# Patient Record
Sex: Female | Born: 1990
Health system: Southern US, Community
[De-identification: ages and names within clinical notes are randomized; demographics above are authoritative.]

## PROBLEM LIST (undated history)

## (undated) ENCOUNTER — Inpatient Hospital Stay: Payer: Self-pay

## (undated) DIAGNOSIS — I10 Essential (primary) hypertension: Secondary | ICD-10-CM

## (undated) DIAGNOSIS — O99210 Obesity complicating pregnancy, unspecified trimester: Secondary | ICD-10-CM

## (undated) DIAGNOSIS — F32A Depression, unspecified: Secondary | ICD-10-CM

## (undated) DIAGNOSIS — E119 Type 2 diabetes mellitus without complications: Secondary | ICD-10-CM

## (undated) DIAGNOSIS — F329 Major depressive disorder, single episode, unspecified: Secondary | ICD-10-CM

## (undated) DIAGNOSIS — F419 Anxiety disorder, unspecified: Secondary | ICD-10-CM

## (undated) DIAGNOSIS — O24419 Gestational diabetes mellitus in pregnancy, unspecified control: Secondary | ICD-10-CM

## (undated) HISTORY — PX: INDUCED ABORTION: SHX677

## (undated) HISTORY — PX: TONSILLECTOMY: SHX5217

## (undated) HISTORY — DX: Major depressive disorder, single episode, unspecified: F32.9

## (undated) HISTORY — DX: Depression, unspecified: F32.A

## (undated) HISTORY — DX: Obesity complicating pregnancy, unspecified trimester: O99.210

## (undated) HISTORY — DX: Anxiety disorder, unspecified: F41.9

---
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
## 2011-02-05 ENCOUNTER — Observation Stay: Payer: Self-pay | Admitting: Obstetrics and Gynecology

## 2011-02-08 ENCOUNTER — Observation Stay: Payer: Self-pay

## 2012-06-10 ENCOUNTER — Emergency Department: Payer: Self-pay | Admitting: Emergency Medicine

## 2012-10-28 ENCOUNTER — Emergency Department: Payer: Self-pay | Admitting: Emergency Medicine

## 2012-10-28 LAB — URINALYSIS, COMPLETE
Bilirubin,UR: NEGATIVE
Glucose,UR: NEGATIVE mg/dL (ref 0–75)
Nitrite: NEGATIVE
Ph: 5 (ref 4.5–8.0)
Protein: 30
Squamous Epithelial: 4
WBC UR: 8 /HPF (ref 0–5)

## 2012-10-28 LAB — COMPREHENSIVE METABOLIC PANEL
Albumin: 3.6 g/dL (ref 3.4–5.0)
BUN: 7 mg/dL (ref 7–18)
Bilirubin,Total: 0.4 mg/dL (ref 0.2–1.0)
Calcium, Total: 9 mg/dL (ref 8.5–10.1)
Chloride: 107 mmol/L (ref 98–107)
Co2: 24 mmol/L (ref 21–32)
EGFR (African American): >60
EGFR (Non-African Amer.): >60
Glucose: 100 mg/dL — ABNORMAL HIGH (ref 65–99)
Osmolality: 274 (ref 275–301)
Potassium: 3 mmol/L — ABNORMAL LOW (ref 3.5–5.1)
SGOT(AST): 12 U/L — ABNORMAL LOW (ref 15–37)
Sodium: 138 mmol/L (ref 136–145)

## 2012-10-28 LAB — CBC
HCT: 40 % (ref 35.0–47.0)
MCH: 27 pg (ref 26.0–34.0)
MCHC: 32.8 g/dL (ref 32.0–36.0)
MCV: 82 fL (ref 80–100)
RBC: 4.87 10*6/uL (ref 3.80–5.20)
RDW: 14.3 % (ref 11.5–14.5)
WBC: 8 10*3/uL (ref 3.6–11.0)

## 2012-10-28 LAB — HCG, QUANTITATIVE, PREGNANCY: Beta Hcg, Quant.: 631 m[IU]/mL — ABNORMAL HIGH

## 2012-10-28 LAB — WET PREP, GENITAL

## 2012-10-29 LAB — GC/CHLAMYDIA PROBE AMP

## 2013-02-10 ENCOUNTER — Emergency Department: Payer: Self-pay | Admitting: Emergency Medicine

## 2013-02-10 LAB — URINALYSIS, COMPLETE
Bilirubin,UR: NEGATIVE
Blood: NEGATIVE
Glucose,UR: NEGATIVE mg/dL (ref 0–75)
Hyaline Cast: 2
Ketone: NEGATIVE
Nitrite: NEGATIVE
Ph: 5 (ref 4.5–8.0)
Protein: NEGATIVE
RBC,UR: 4 /HPF (ref 0–5)
Specific Gravity: 1.027 (ref 1.003–1.030)
WBC UR: 16 /HPF (ref 0–5)

## 2013-02-10 LAB — HCG, QUANTITATIVE, PREGNANCY: Beta Hcg, Quant.: 11480 m[IU]/mL — ABNORMAL HIGH

## 2013-04-09 ENCOUNTER — Observation Stay: Payer: Self-pay | Admitting: Obstetrics and Gynecology

## 2013-04-09 LAB — URINALYSIS, COMPLETE
Bacteria: NONE SEEN
Bilirubin,UR: NEGATIVE
Blood: NEGATIVE
Glucose,UR: NEGATIVE mg/dL (ref 0–75)
Ketone: NEGATIVE
Leukocyte Esterase: NEGATIVE
Nitrite: NEGATIVE
Ph: 5 (ref 4.5–8.0)
Protein: NEGATIVE
RBC,UR: 3 /HPF (ref 0–5)
Specific Gravity: 1.029 (ref 1.003–1.030)
Squamous Epithelial: 4
WBC UR: 3 /HPF (ref 0–5)

## 2013-04-10 LAB — GC/CHLAMYDIA PROBE AMP

## 2013-04-10 LAB — WET PREP, GENITAL

## 2013-05-12 ENCOUNTER — Observation Stay: Payer: Self-pay

## 2013-05-12 LAB — URINALYSIS, COMPLETE
Bilirubin,UR: NEGATIVE
Blood: NEGATIVE
Glucose,UR: NEGATIVE mg/dL (ref 0–75)
Nitrite: NEGATIVE
Ph: 5 (ref 4.5–8.0)
Protein: 30
RBC,UR: 2 /HPF (ref 0–5)
Specific Gravity: 1.033 (ref 1.003–1.030)
Squamous Epithelial: 5
WBC UR: 9 /HPF (ref 0–5)

## 2013-05-14 LAB — URINE CULTURE

## 2013-06-28 ENCOUNTER — Ambulatory Visit: Payer: Self-pay | Admitting: Obstetrics and Gynecology

## 2013-06-28 LAB — CBC WITH DIFFERENTIAL/PLATELET
Basophil #: 0.1 10*3/uL (ref 0.0–0.1)
Basophil %: 0.7 %
Eosinophil #: 0.1 10*3/uL (ref 0.0–0.7)
Eosinophil %: 0.7 %
HCT: 37.1 % (ref 35.0–47.0)
HGB: 12.1 g/dL (ref 12.0–16.0)
Lymphocyte #: 1.7 10*3/uL (ref 1.0–3.6)
Lymphocyte %: 24.4 %
MCH: 26.9 pg (ref 26.0–34.0)
MCHC: 32.6 g/dL (ref 32.0–36.0)
MCV: 83 fL (ref 80–100)
Monocyte #: 0.4 x10 3/mm (ref 0.2–0.9)
Monocyte %: 5.7 %
Neutrophil #: 4.8 10*3/uL (ref 1.4–6.5)
Neutrophil %: 68.5 %
Platelet: 225 10*3/uL (ref 150–440)
RBC: 4.5 10*6/uL (ref 3.80–5.20)
RDW: 14.8 % — ABNORMAL HIGH (ref 11.5–14.5)
WBC: 7 10*3/uL (ref 3.6–11.0)

## 2013-06-29 ENCOUNTER — Inpatient Hospital Stay: Payer: Self-pay | Admitting: Obstetrics and Gynecology

## 2013-06-30 LAB — HEMATOCRIT: HCT: 32.8 % — AB (ref 35.0–47.0)

## 2013-07-01 LAB — GC/CHLAMYDIA PROBE AMP

## 2014-06-11 ENCOUNTER — Emergency Department: Payer: Self-pay | Admitting: Emergency Medicine

## 2014-07-30 NOTE — Op Note (Signed)
PATIENT NAME:  Natasha Davidson, Natasha Davidson MR#:  [id number] DATE OF BIRTH:  May 05, 1990  DATE OF PROCEDURE:  06/29/2013  PREOPERATIVE DIAGNOSES: 1.  Intrauterine pregnancy at [redacted] weeks gestation.  2.  History of prior cesarean section, desires repeat.   POSTOPERATIVE DIAGNOSES:  1.  Intrauterine pregnancy at [redacted] weeks gestation.  2.  History of prior cesarean section, desires repeat.   PROCEDURES: 1.  Repeat low transverse cesarean section via Pfannenstiel incision.  2.  Adhesiolysis.  SURGEON: Thomasene Mohair, MD  ASSISTANT: Vena Austria, MD   ANESTHESIA: Spinal.   ESTIMATED BLOOD LOSS: 750 mL.  OPERATIVE FLUIDS: 2200 mL crystalloid.   COMPLICATIONS: None.   FINDINGS: 1.  Dense uterine adhesions of right rectus abdominis muscle/peritoneum to the right anterior uterine wall. Dense adhesions of omentum to the anterior uterine wall.  2.  Viable female infant with Apgars of 8 at one minute and 9 at five minutes with a weight of 7 pounds 12 ounces or 3520 grams.   SPECIMENS: None.   CONDITION AT END OF PROCEDURE: Stable.   PROCEDURE IN DETAIL: The patient was taken to the operating room where spinal regional anesthesia was administered and found to be adequate. She was placed in the dorsal supine position with a leftward tilt and prepped and draped in the usual sterile fashion. After timeout was called, a Pfannenstiel incision was made and carried through the various layers until the peritoneum was identified and entered sharply. It was noted upon entry into the abdominal cavity there were dense adhesions of the right rectus muscle to the anterior uterine wall as well as dense omental adhesions. Eventually tracks could be found where lysis of these adhesions could be undertaken and were undertaken using serial clamping of 2 Kelly clamps followed by transection sharply between the Licking clamps and tying off of the muscle peritoneal tissue with 0 Vicryl as well as the other side of the tissue that was  cut with 0 Vicryl. This was done until all adhesions were taken down. A small bladder flap was created and the bladder retractor was placed to pull the bladder out of the operative area of interest. A low transverse hysterotomy was made with a scalpel and extended laterally with cranial and caudal tension. After rupture of membranes for clear fluid, the fetal vertex was grasped and elevated to the hysterotomy and with fundal pressure the head followed by the arm, shoulders and the rest of body was delivered without difficulty. A single nuchal cord was noted and was reduced at the time of delivery. The cord was clamped and cut and the infant was handed off to the pediatrician. No cord blood was collected. Placenta was delivered spontaneously intact with a 3 vessel cord. The uterus was then exteriorized and cleared of all clots and debris. The hysterotomy was closed using #0 Vicryl in a running locked fashion. A second layer of the same suture was used to obtain hemostasis. The uterus was returned to the abdomen. The gutters were cleared of all clots and debris.   The On-Q catheters were placed according to the manufacturer's recommendations. They were located approximately 4 cm cephalad to the incision line, approximately 1 cm apart, straddling the midline. They were inserted to a depth of approximately the fourth marking on the catheter and positioned just superficial to the rectus abdominis muscles and just deep to the rectus fascia.   The fascia was closed using #0 Maxon using 2 sutures each starting at the lateral apices and meeting in the  midline where they were tied together. The subcutaneous tissue was then irrigated and reapproximated using 2-0 plain gut such that no greater than 2 cm of dead space remained. The skin was closed using #4-0 Monocryl in a subcuticular fashion. This incision closure was reinforced using benzoin and Steri-Strips. A pressure dressing was applied to this closure.   The On-Q  catheters were each bolused with 5 mL of 0.5% Marcaine plain for a total of 10 mL. They were affixed to the skin using Dermabond, Steri-Strips and Tegaderm.   The patient tolerated the procedure well. Sponge, lap, and needle counts were correct x2. The patient received Ancef 3 grams for antibiotic prophylaxis. For VTE prophylaxis, the patient was wearing SCDs which were on and operating throughout the entire procedure. She was taken to the recovery area in stable condition.    ____________________________ Conard NovakStephen D. Daoud Lobue, MD sdj:sb D: 06/29/2013 10:20:40 ET T: 06/29/2013 10:30:56 ET JOB#: [id number]  cc: Conard NovakStephen D. Philipp Callegari, MD, <Dictator> Conard NovakSTEPHEN D Ab Leaming MD ELECTRONICALLY SIGNED 07/27/2013 15:00

## 2014-08-16 NOTE — H&P (Signed)
L&D Evaluation:  History:  HPI 24 yo G2P1001 at 2875w1d gestational age by LMP consistent with 12 week ultrasound.  Her pregnancy is complicated by obesity (BMI 41 at initial visit), chronic hypertension for which she takes no medication, prior cesarean section, and a history of bipolar disorder.  [facility]  She presents with multiple complaints including sacral back pain, SOB, decreased FM, and swelling in her lower extremities.  She notes no dysuria or fever, but has had urinary frequency x 1 week. Pain is in the sacral area and shoots up her spine. Has had episodes of SOB lasting 2 minutes-resolves with rest. No CP with SOB. Notices it most with exertion. Last EKG done for similar sx about 5 years ago was normal. FM decreased today, but has not eaten much. Has been having problems with reflux and nausea. Has been taking Tums. No vaginal bleeding, no regular contractions. Some round ligament pain. Denies diarrhea or constipation. Has gained 12# with pregnancy.   Presents with back pain, decreased fetal movement, SOB, swelling in lower extremities   Patient's Medical History 1) hypertension, 2) morbid obesity, 3) bipolar disorder   Patient's Surgical History c-section, tonsillectomy   Medications Tums   Allergies NKDA   Social History tobacco  3-4 cigs/day   Family History Non-Contributory   ROS:  ROS see HPI   Exam:  Vital Signs initial BP 140/77, then 127/71, 116/64, 118/65, 117/59, 115/59. Pulse O2=99%   Urine Protein 1+, +1 leuks, 2 RBC, 9WBC, trace bacteria, 5 epi cells, sp grav 1.033   General no apparent distress, appears comfortable   Mental Status clear   Chest clear   Heart normal sinus rhythm, no murmur/gallop/rubs   Abdomen gravid, non-tender, BS active, no RUQ pain   Back no CVAT, points to sacral area and right sacro-iliac area   Edema trace   Reflexes 2+   Mebranes Intact   FHT baseline 135 with accels to  150s to 160   FHT Description mod variability   Ucx absent   Skin dry   Impression:  Impression reactive NST, IUP at 32 1/7 weeks with multiple somatic complaints-all probably R/T  normal discomforts of pregnancy. R/O UTI   Plan:  Plan Urine Culture ordered. Will start on Keflex while awaiting culture. Protonix 40 mgm po daily. Zofran 8 mg q8h prn. sSmall frequent meals and increase fluids.Will get EKG as OP. FU in clinic  early next week.   Electronic Signatures: Trinna BalloonGutierrez, Yisroel Mullendore L (CNM)  (Signed 05-Feb-15 01:20)  Authored: L&D Evaluation   Last Updated: 05-Feb-15 01:20 by Trinna BalloonGutierrez, Jourdyn Hasler L (CNM)

## 2014-08-16 NOTE — H&P (Signed)
L&D Evaluation:  History Expanded:  HPI 24 yo G2P1001 at 8326w3d gestational age by LMP consistent with 12 week ultrasound.  Her pregnancy is complicated by obesity (BMI 41 at initial visit), chronic hypertension for which she takes no medication, prior cesarean section, and a history of bipolar disorder.  [facility] is [facility].  She presents with sudden-onset pelvic pressure since 6pm this evening (5 hours ago). She notes no urinary symptoms.  She denies vaginal symptoms of irritation, itching, discharge. She has not had recent intercourse, nothing per vagina in at least 24 hours.  She notes positive fetal movement, no leakage of fluid, no vaginal bleeding, no contractions.   Blood Type (Maternal) A positive   Group B Strep Results Maternal (Result >5wks must be treated as unknown) unknown/result > 5 weeks ago   Maternal HIV Negative   Maternal Syphilis Ab Nonreactive   Maternal Varicella Immune   Rubella Results (Maternal) immune   [facility]   Patient's Medical History 1) hypertension, 2) morbid obesity, 3) bipolar disorder   Patient's Surgical History c-section, tonsillectomy   Medications Pre Natal Vitamins   Allergies NKDA   Social History tobacco  3-4 cigs/day   Family History Non-Contributory   ROS:  ROS All systems were reviewed.  HEENT, CNS, GI, GU, Respiratory, CV, Renal and Musculoskeletal systems were found to be normal., unless otherwise noted in HPI   Exam:  Vital Signs BPs 122-142/64-76, P97   Urine Protein negative dipstick   General no apparent distress   Mental Status moves air well   Abdomen gravid, non-tender   Edema no edema   Pelvic no external lesions, cervix closed and thick   Mebranes Intact   FHT normal rate with no decels   FHT Description 135/mod var/+10x10 accels/no decels   Ucx absent   Impression:  Impression reactive NST, 1) Intrauterine pregnancy at 28 weeks, 2) Pelvic Pressure    Plan:  Comments 1) UA negative 2) send wet prep, gonorrhe/chlamydia NAAT 3) hold fFN as no contractions and cervix is closed 4) most likely will discharge with follow up in clinic as previously scheduled in 3 days.  Will treat if one of the above comes back as positive.   Follow Up Appointment already scheduled. 3 days   Labs:  Lab Results:  Routine Micro:  02-Jan-15 23:52   Micro Text Report WET PREP   COMMENT                   FEW WHITE BLOOD CELLS SEEN   COMMENT                   FEW YEAST SEEN   COMMENT                   FEW CLUE CELLS SEEN   COMMENT                   NO TRICHOMONAS OR SPERMATOZOA SEEN   ANTIBIOTIC        Specimen Source VAGINAL  Comment 1. FEW WHITE BLOOD CELLS SEEN  Comment 2. FEW YEAST SEEN  Comment 3. FEW CLUE CELLS SEEN  Comment 4. NO TRICHOMONAS OR SPERMATOZOA SEEN  Result(s) reported on 10 Apr 2013 at 12:03AM.  Routine UA:  02-Jan-15 22:31   Color (UA) Yellow  Clarity (UA) Hazy  Glucose (UA) Negative  Bilirubin (UA) Negative  Ketones (UA) Negative  Specific Gravity (UA) 1.029  Blood (UA)  Negative  pH (UA) 5.0  Protein (UA) Negative  Nitrite (UA) Negative  Leukocyte Esterase (UA) Negative (Result(s) reported on 09 Apr 2013 at 11:11PM.)  RBC (UA) 3 /HPF  WBC (UA) 3 /HPF  Bacteria (UA) NONE SEEN  Epithelial Cells (UA) 4 /HPF  Mucous (UA) PRESENT (Result(s) reported on 09 Apr 2013 at 11:11PM.)   Electronic Signatures: Conard NovakJackson, Draya Felker D (MD)  (Signed 03-Jan-15 00:04)  Authored: L&D Evaluation, Labs   Last Updated: 03-Jan-15 00:04 by Conard NovakJackson, Celeste Candelas D (MD)

## 2014-09-15 ENCOUNTER — Encounter: Payer: Self-pay | Admitting: Emergency Medicine

## 2014-09-15 ENCOUNTER — Emergency Department: Payer: Medicaid Other

## 2014-09-15 ENCOUNTER — Emergency Department
Admission: EM | Admit: 2014-09-15 | Discharge: 2014-09-15 | Disposition: A | Discharge status: Home / Self Care | Payer: Medicaid Other | Attending: Emergency Medicine | Admitting: Emergency Medicine

## 2014-09-15 DIAGNOSIS — Z79899 Other long term (current) drug therapy: Secondary | ICD-10-CM | POA: Diagnosis not present

## 2014-09-15 DIAGNOSIS — O200 Threatened abortion: Secondary | ICD-10-CM | POA: Diagnosis not present

## 2014-09-15 DIAGNOSIS — O26899 Other specified pregnancy related conditions, unspecified trimester: Secondary | ICD-10-CM

## 2014-09-15 DIAGNOSIS — Z3A01 Less than 8 weeks gestation of pregnancy: Secondary | ICD-10-CM | POA: Insufficient documentation

## 2014-09-15 DIAGNOSIS — O9989 Other specified diseases and conditions complicating pregnancy, childbirth and the puerperium: Secondary | ICD-10-CM | POA: Diagnosis present

## 2014-09-15 DIAGNOSIS — R102 Pelvic and perineal pain: Secondary | ICD-10-CM

## 2014-09-15 LAB — BASIC METABOLIC PANEL
Anion gap: 6 (ref 5–15)
BUN: 9 mg/dL (ref 6–20)
CO2: 25 mmol/L (ref 22–32)
Calcium: 8.9 mg/dL (ref 8.9–10.3)
Chloride: 109 mmol/L (ref 101–111)
Creatinine, Ser: 0.88 mg/dL (ref 0.44–1.00)
GFR calc Af Amer: >60 mL/min (ref >60)
GFR calc non Af Amer: >60 mL/min (ref >60)
Glucose, Bld: 102 mg/dL — ABNORMAL HIGH (ref 65–99)
Potassium: 3.8 mmol/L (ref 3.5–5.1)
Sodium: 140 mmol/L (ref 135–145)

## 2014-09-15 LAB — CBC WITH DIFFERENTIAL/PLATELET
Basophils Absolute: 0 10*3/uL (ref 0–0.1)
Basophils Relative: 1 %
EOS PCT: 2 %
Eosinophils Absolute: 0.1 10*3/uL (ref 0–0.7)
HCT: 43 % (ref 35.0–47.0)
Hemoglobin: 13.5 g/dL (ref 12.0–16.0)
Lymphocytes Relative: 36 %
Lymphs Abs: 2.5 10*3/uL (ref 1.0–3.6)
MCH: 26.2 pg (ref 26.0–34.0)
MCHC: 31.3 g/dL — ABNORMAL LOW (ref 32.0–36.0)
MCV: 83.6 fL (ref 80.0–100.0)
Monocytes Absolute: 0.4 10*3/uL (ref 0.2–0.9)
Monocytes Relative: 5 %
Neutro Abs: 3.8 10*3/uL (ref 1.4–6.5)
Neutrophils Relative %: 56 %
Platelets: 288 10*3/uL (ref 150–440)
RBC: 5.14 MIL/uL (ref 3.80–5.20)
RDW: 14.7 % — ABNORMAL HIGH (ref 11.5–14.5)
WBC: 6.8 10*3/uL (ref 3.6–11.0)

## 2014-09-15 LAB — URINALYSIS COMPLETE WITH MICROSCOPIC (ARMC ONLY)
Bacteria, UA: NONE SEEN
Bilirubin Urine: NEGATIVE
Glucose, UA: NEGATIVE mg/dL
Hgb urine dipstick: NEGATIVE
KETONES UR: NEGATIVE mg/dL
Leukocytes, UA: NEGATIVE
NITRITE: NEGATIVE
PROTEIN: NEGATIVE mg/dL
Specific Gravity, Urine: 1.026 (ref 1.005–1.030)
pH: 5 (ref 5.0–8.0)

## 2014-09-15 LAB — HCG, QUANTITATIVE, PREGNANCY: hCG, Beta Chain, Quant, S: 171 m[IU]/mL — ABNORMAL HIGH (ref <5)

## 2014-09-15 MED ORDER — ONDANSETRON 4 MG PO TBDP
ORAL_TABLET | ORAL | Status: AC
  Administered 2014-09-15: 4 mg
  Filled 2014-09-15: qty 1

## 2014-09-15 MED ORDER — ONDANSETRON HCL 4 MG PO TABS: 4.0000 mg | ORAL_TABLET | Freq: Once | ORAL | Status: AC

## 2014-09-15 NOTE — Discharge Instructions (Signed)
Your ultrasound studies something that appears to be an early pregnancy. Your pregnancy hormone level is quite low. This all may be okay. As we discussed, you need to have a repeat hCG test and further evaluation. Follow-up with your doctor or [facility] in 2-5 days. If you have increasing pain, if you have pain and bleeding or you have any other urgent concerns, return to the emergency department.  Threatened Miscarriage A threatened miscarriage is when you have vaginal bleeding during your first 20 weeks of pregnancy but the pregnancy has not ended. Your doctor will do tests to make sure you are still pregnant. The cause of the bleeding may not be known. This condition does not mean your pregnancy will end. It does increase the risk of it ending (complete miscarriage). HOME CARE   Make sure you keep all your doctor visits for prenatal care.  Get plenty of rest.  Do not have sex or use tampons if you have vaginal bleeding.  Do not douche.  Do not smoke or use drugs.  Do not drink alcohol.  Avoid caffeine. GET HELP IF:  You have light bleeding from your vagina.  You have belly pain or cramping.  You have a fever. GET HELP RIGHT AWAY IF:   You have heavy bleeding from your vagina.  You have clots of blood coming from your vagina.  You have bad pain or cramps in your low back or belly.  You have fever, chills, and bad belly pain. MAKE SURE YOU:   Understand these instructions.  Will watch your condition.  Will get help right away if you are not doing well or get worse. Document Released: 03/07/2008 Document Revised: 03/30/2013 Document Reviewed: 01/19/2013 [facility] Patient Information 2015 [place], [place]. This information is not intended to replace advice given to you by your health care provider. Make sure you discuss any questions you have with your health care provider.

## 2014-09-15 NOTE — ED Notes (Addendum)
Patient c/o lower abdominal pain, described as sharp and cramping, constant. Patient is 4.[redacted] weeks pregnant and has seen her OB/GYN.

## 2014-09-15 NOTE — ED Provider Notes (Signed)
[facility] Emergency Department Provider Note  ____________________________________________  Time seen: 12:10 PM  I have reviewed the triage vital signs and the nursing notes.   HISTORY  Chief Complaint Abdominal Pain  pelvic pain with pregnancy    HPI Natasha Davidson is a 24 y.o. female who reports she is 4-40 half weeks pregnant. She has seen her doctor at [facility] but has not had a GYN appointment yet. She has had some mild nausea this week otherwise no complaints. This morning she woke with lower abdominal pain and discomfort. She has been cramping intermittently. The severity is moderate. She denies any vaginal bleeding or discharge.  She is G3 P2 A0     History reviewed. No pertinent past medical history.  There are no active problems to display for this patient.   History reviewed. No pertinent past surgical history.  Current Outpatient Rx  Name  Route  Sig  Dispense  Refill  . Prenatal Vit-Fe Fumarate-FA (PRENATAL VITAMIN PO)   Oral   Take 1 tablet by mouth daily.           Allergies Review of patient's allergies indicates no known allergies.  History reviewed. No pertinent family history.  Social History History  Substance Use Topics  . Smoking status: Never Smoker   . Smokeless tobacco: Not on file  . Alcohol Use: No    Review of Systems  Constitutional: Negative for fever. ENT: Negative for sore throat. Cardiovascular: Negative for chest pain. Respiratory: Negative for shortness of breath. Gastrointestinal: Negative for abdominal pain and diarrhea. She does have some mild nausea. Genitourinary: Positive for lower abdominal discomfort and cramping. She is 4-40 half weeks pregnant. See history of present illness Musculoskeletal: Negative for back pain. Skin: Negative for rash. Neurological: Negative for headaches   10-point ROS otherwise negative.  ____________________________________________   PHYSICAL  EXAM:  VITAL SIGNS: ED Triage Vitals  Enc Vitals Group     BP 09/15/14 1154 128/78 mmHg     Pulse Rate 09/15/14 1154 93     Resp 09/15/14 1154 20     Temp 09/15/14 1154 99.1 F (37.3 C)     Temp src --      SpO2 09/15/14 1154 99 %     Weight 09/15/14 1154 228 lb (103.42 kg)     Height 09/15/14 1154 5\' 4"  (1.626 m)     Head Cir --      Peak Flow --      Pain Score 09/15/14 1154 7     Pain Loc --      Pain Edu? --      Excl. in GC? --     Constitutional:  Alert and oriented. Well appearing and in no distress. ENT   Head: Normocephalic and atraumatic.   Nose: No congestion/rhinnorhea.   Mouth/Throat: Mucous membranes are moist. Cardiovascular: Normal rate, regular rhythm. Respiratory: Normal respiratory effort without tachypnea. Breath sounds are clear and equal bilaterally. No wheezes/rales/rhonchi. Gastrointestinal: Soft, no distention.  Minimal tenderness in the lower abdomen diffusely. Back: No muscle spasm, no tenderness, no CVA tenderness. Musculoskeletal: Nontender with normal range of motion in all extremities.  No noted edema. Neurologic:  Normal speech and language. No gross focal neurologic deficits are appreciated.  Skin:  Skin is warm, dry. No rash noted. Psychiatric: Mood and affect are normal. Speech and behavior are normal.  ____________________________________________    LABS (pertinent positives/negatives)  CBC: Within normal limits Metabolic panel: Within normal limits HCG Quant: 171  Urinalysis no sign of infection  ____________________________________________   RADIOLOGY  Pelvic ultrasound: IMPRESSION: 1. Minimal fluid in the endometrial canal may represent a small gestational sac. Probable early intrauterine gestational sac, but no yolk sac, fetal pole, or cardiac activity yet visualized. Recommend follow-up quantitative B-HCG levels and follow-up US in 14 days to confirm and assess viability. This recommendation follows  SRU consensus guidelines: Diagnostic Criteria for Nonviable Pregnancy Early in the First Trimester. Malva Limes Med 2013; 161:0960-45. 2. No complicating features.  ____________________________________________  ____________________________________________   INITIAL IMPRESSION / ASSESSMENT AND PLAN / ED COURSE  Early pregnancy. Some cramping discomfort. We will obtain an ultrasound to assess for low likely chance of ectopic pregnancy.  ----------------------------------------- 3:11 PM on 09/15/2014 -----------------------------------------  HCG is on the low side but of course this is highly variable. The ultrasound has suggestions of an early gestational sac, probable early intrauterine gestation, but not definitive. I'll have the patient follow up with OB for further evaluation and repeat hCG levels. ____________________________________________   FINAL CLINICAL IMPRESSION(S) / ED DIAGNOSES  Final diagnoses:  Threatened miscarriage in early pregnancy  cramping, early pregnancy    Darien Ramus, MD 09/15/14 1515

## 2014-09-15 NOTE — ED Notes (Signed)
Having lower abd pain with nausea.since this am.the patient is 4 weeks preg  No vaginal bleeding

## 2014-11-13 ENCOUNTER — Emergency Department: Payer: Medicaid Other

## 2014-11-13 ENCOUNTER — Emergency Department
Admission: EM | Admit: 2014-11-13 | Discharge: 2014-11-13 | Disposition: A | Discharge status: Home / Self Care | Payer: Medicaid Other | Attending: Emergency Medicine | Admitting: Emergency Medicine

## 2014-11-13 ENCOUNTER — Encounter: Payer: Self-pay | Admitting: Emergency Medicine

## 2014-11-13 DIAGNOSIS — Z79899 Other long term (current) drug therapy: Secondary | ICD-10-CM | POA: Insufficient documentation

## 2014-11-13 DIAGNOSIS — N760 Acute vaginitis: Secondary | ICD-10-CM

## 2014-11-13 DIAGNOSIS — O23591 Infection of other part of genital tract in pregnancy, first trimester: Secondary | ICD-10-CM | POA: Insufficient documentation

## 2014-11-13 DIAGNOSIS — R109 Unspecified abdominal pain: Secondary | ICD-10-CM

## 2014-11-13 DIAGNOSIS — B9689 Other specified bacterial agents as the cause of diseases classified elsewhere: Secondary | ICD-10-CM

## 2014-11-13 DIAGNOSIS — Z3A13 13 weeks gestation of pregnancy: Secondary | ICD-10-CM | POA: Insufficient documentation

## 2014-11-13 DIAGNOSIS — O26899 Other specified pregnancy related conditions, unspecified trimester: Secondary | ICD-10-CM

## 2014-11-13 DIAGNOSIS — O9989 Other specified diseases and conditions complicating pregnancy, childbirth and the puerperium: Secondary | ICD-10-CM | POA: Diagnosis present

## 2014-11-13 LAB — COMPREHENSIVE METABOLIC PANEL
ALT: 9 U/L — ABNORMAL LOW (ref 14–54)
AST: 17 U/L (ref 15–41)
Albumin: 3.4 g/dL — ABNORMAL LOW (ref 3.5–5.0)
Alkaline Phosphatase: 85 U/L (ref 38–126)
Anion gap: 8 (ref 5–15)
BUN: 10 mg/dL (ref 6–20)
CHLORIDE: 105 mmol/L (ref 101–111)
CO2: 24 mmol/L (ref 22–32)
Calcium: 8.9 mg/dL (ref 8.9–10.3)
Creatinine, Ser: 0.78 mg/dL (ref 0.44–1.00)
GFR calc Af Amer: >60 mL/min (ref >60)
GFR calc non Af Amer: >60 mL/min (ref >60)
Glucose, Bld: 123 mg/dL — ABNORMAL HIGH (ref 65–99)
Potassium: 3.5 mmol/L (ref 3.5–5.1)
Sodium: 137 mmol/L (ref 135–145)
Total Bilirubin: <0.1 mg/dL — ABNORMAL LOW (ref 0.3–1.2)
Total Protein: 6.8 g/dL (ref 6.5–8.1)

## 2014-11-13 LAB — CBC
HCT: 38.9 % (ref 35.0–47.0)
Hemoglobin: 12.9 g/dL (ref 12.0–16.0)
MCH: 27.2 pg (ref 26.0–34.0)
MCHC: 33.1 g/dL (ref 32.0–36.0)
MCV: 82.2 fL (ref 80.0–100.0)
Platelets: 265 10*3/uL (ref 150–440)
RBC: 4.73 MIL/uL (ref 3.80–5.20)
RDW: 13.9 % (ref 11.5–14.5)
WBC: 7.1 10*3/uL (ref 3.6–11.0)

## 2014-11-13 LAB — URINALYSIS COMPLETE WITH MICROSCOPIC (ARMC ONLY)
Bilirubin Urine: NEGATIVE
Glucose, UA: NEGATIVE mg/dL
Hgb urine dipstick: NEGATIVE
Leukocytes, UA: NEGATIVE
Nitrite: NEGATIVE
Protein, ur: NEGATIVE mg/dL
Specific Gravity, Urine: 1.03 (ref 1.005–1.030)
pH: 5 (ref 5.0–8.0)

## 2014-11-13 LAB — WET PREP, GENITAL
Trich, Wet Prep: NONE SEEN
YEAST WET PREP: NONE SEEN

## 2014-11-13 LAB — POCT PREGNANCY, URINE: Preg Test, Ur: POSITIVE — AB

## 2014-11-13 LAB — HCG, QUANTITATIVE, PREGNANCY: hCG, Beta Chain, Quant, S: 84958 m[IU]/mL — ABNORMAL HIGH (ref <5)

## 2014-11-13 MED ORDER — PRENATAL MULTIVITAMIN CH: 1.0000 | ORAL_TABLET | Freq: Every day | ORAL | Status: DC

## 2014-11-13 MED ORDER — METRONIDAZOLE 500 MG PO TABS: 500.0000 mg | ORAL_TABLET | Freq: Two times a day (BID) | ORAL | Status: AC

## 2014-11-13 MED ORDER — METRONIDAZOLE 500 MG PO TABS
500.0000 mg | ORAL_TABLET | Freq: Once | ORAL | Status: AC
  Administered 2014-11-13: 500 mg via ORAL
  Filled 2014-11-13: qty 1

## 2014-11-13 MED ORDER — ACETAMINOPHEN 325 MG PO TABS
650.0000 mg | ORAL_TABLET | Freq: Once | ORAL | Status: AC
  Administered 2014-11-13: 650 mg via ORAL
  Filled 2014-11-13: qty 2

## 2014-11-13 NOTE — ED Notes (Signed)
Patient presents to the ED with lower abdominal cramping and pain "around c-section scar".  Patient reports normal vomiting/morning sickness but no abnormal vomiting or diarrhea.  Patient states she is [redacted] weeks pregnant.  Patient is a patient at [facility].  Patient denies vaginal bleeding.

## 2014-11-13 NOTE — Discharge Instructions (Signed)

## 2014-11-13 NOTE — ED Provider Notes (Signed)
[facility] Emergency Department Provider Note  ____________________________________________  Time seen: Approximately 8 PM  I have reviewed the triage vital signs and the nursing notes.   HISTORY  Chief Complaint Abdominal Pain    HPI Natasha Davidson is a 24 y.o. female at 30 weeks of pregnancy was presenting with abdominal pain which she says it feels like an aching over her C-section scar. She's been having pain since earlier today. No change in her nausea and vomiting which has been baseline with her morning sickness. No pain with urination. No concern for STDs and has no history of STDs. Says that she was concerned because of previous C-sections and also because she had a previous ultrasound which did not show definitive pregnancy earlier in this pregnancy. Denies any vaginal bleeding or discharge.   History reviewed. No pertinent past medical history.  There are no active problems to display for this patient.   Past Surgical History  Procedure Laterality Date  . Cesarean section  2015    Current Outpatient Rx  Name  Route  Sig  Dispense  Refill  . Doxylamine-Pyridoxine (DICLEGIS PO)   Oral   Take 1 tablet by mouth 2 (two) times daily.         . Prenatal Vit-Fe Fumarate-FA (PRENATAL VITAMIN PO)   Oral   Take 1 tablet by mouth daily.           Allergies Review of patient's allergies indicates no known allergies.  No family history on file.  Social History History  Substance Use Topics  . Smoking status: Never Smoker   . Smokeless tobacco: Not on file  . Alcohol Use: No    Review of Systems Constitutional: No fever/chills Eyes: No visual changes. ENT: No sore throat. Cardiovascular: Denies chest pain. Respiratory: Denies shortness of breath. Gastrointestinal:   No diarrhea.  No constipation. Genitourinary: Negative for dysuria. Musculoskeletal: Negative for back pain. Skin: Negative for rash. Neurological: Negative for  headaches, focal weakness or numbness.  10-point ROS otherwise negative.  ____________________________________________   PHYSICAL EXAM:  VITAL SIGNS: ED Triage Vitals  Enc Vitals Group     BP 11/13/14 1659 140/92 mmHg     Pulse Rate 11/13/14 1659 104     Resp 11/13/14 1659 20     Temp 11/13/14 1659 98.6 F (37 C)     Temp Source 11/13/14 1659 Oral     SpO2 11/13/14 1659 99 %     Weight 11/13/14 1659 232 lb (105.235 kg)     Height 11/13/14 1659  (1.626 m)     Head Cir --      Peak Flow --      Pain Score 11/13/14 1659 9     Pain Loc --      Pain Edu? --      Excl. in GC? --     Constitutional: Alert and oriented. Well appearing and in no acute distress. Eyes: Conjunctivae are normal. PERRL. EOMI. Head: Atraumatic. Nose: No congestion/rhinnorhea. Mouth/Throat: Mucous membranes are moist.  Oropharynx non-erythematous. Neck: No stridor.   Cardiovascular: Normal rate, regular rhythm. Grossly normal heart sounds.  Good peripheral circulation. Respiratory: Normal respiratory effort.  No retractions. Lungs CTAB. Gastrointestinal: Soft with tenderness over the C-section scar. No rebound or guarding.. No distention. No abdominal bruits. No CVA tenderness. Genitourinary:  Normal external exam. Speculum exam with minimal white discharge. Bimanual exam with a closed cervical os. No CMT. Moderate Uterine as well as bilateral adnexal tenderness. No  masses palpated. Musculoskeletal: No lower extremity tenderness nor edema.  No joint effusions. Neurologic:  Normal speech and language. No gross focal neurologic deficits are appreciated. No gait instability. Skin:  Skin is warm, dry and intact. No rash noted. Psychiatric: Mood and affect are normal. Speech and behavior are normal.  ____________________________________________   LABS (all labs ordered are listed, but only abnormal results are displayed)  Labs Reviewed  WET PREP, GENITAL - Abnormal; Notable for the following:     Clue Cells Wet Prep HPF POC FEW (*)    WBC, Wet Prep HPF POC FEW (*)    All other components within normal limits  COMPREHENSIVE METABOLIC PANEL - Abnormal; Notable for the following:    Glucose, Bld 123 (*)    Albumin 3.4 (*)    ALT 9 (*)    Total Bilirubin <0.1 (*)    All other components within normal limits  URINALYSIS COMPLETEWITH MICROSCOPIC ([facility] ONLY) - Abnormal; Notable for the following:    Color, Urine YELLOW (*)    APPearance HAZY (*)    Ketones, ur TRACE (*)    Bacteria, UA RARE (*)    Squamous Epithelial / LPF 6-30 (*)    All other components within normal limits  HCG, QUANTITATIVE, PREGNANCY - Abnormal; Notable for the following:    hCG, Beta Chain, Quant, S 59563 (*)    All other components within normal limits  POCT PREGNANCY, URINE - Abnormal; Notable for the following:    Preg Test, Ur POSITIVE (*)    All other components within normal limits  CHLAMYDIA/NGC RT PCR ([facility] ONLY)  CBC  POC URINE PREG, ED   ____________________________________________  EKG   ____________________________________________  RADIOLOGY  Single live IUP at 13 weeks. No pathologic findings. ____________________________________________   PROCEDURES    ____________________________________________   INITIAL IMPRESSION / ASSESSMENT AND PLAN / ED COURSE  Pertinent labs & imaging results that were available during my care of the patient were reviewed by me and considered in my medical decision making (see chart for details).  ----------------------------------------- 9:40 PM on 11/13/2014 -----------------------------------------  Patient resting comfortably at this time. Tolerating by mouth fluids. Discussed ultrasound findings as well as lab results with the patient. We'll treat for bacterial vaginosis. Said had this during her last pregnancy. Has follow-up appointment this Tuesday with [facility] side OB/GYN. ____________________________________________   FINAL  CLINICAL IMPRESSION(S) / ED DIAGNOSES  Acute abdominal pain and pregnancy. Acute bacterial vaginosis. Initial visit.    Myrna Blazer, MD 11/13/14 [phone]

## 2014-11-14 LAB — CHLAMYDIA/NGC RT PCR (ARMC ONLY)
CHLAMYDIA TR: NOT DETECTED
N gonorrhoeae: NOT DETECTED

## 2015-02-20 ENCOUNTER — Encounter: Payer: Self-pay | Admitting: *Deleted

## 2015-02-20 ENCOUNTER — Observation Stay
Admission: EM | Admit: 2015-02-20 | Discharge: 2015-02-20 | Disposition: A | Discharge status: Home / Self Care | Payer: Medicaid Other | Attending: Obstetrics and Gynecology | Admitting: Obstetrics and Gynecology

## 2015-02-20 DIAGNOSIS — O1212 Gestational proteinuria, second trimester: Secondary | ICD-10-CM | POA: Diagnosis present

## 2015-02-20 DIAGNOSIS — E876 Hypokalemia: Secondary | ICD-10-CM | POA: Diagnosis present

## 2015-02-20 DIAGNOSIS — O10912 Unspecified pre-existing hypertension complicating pregnancy, second trimester: Secondary | ICD-10-CM | POA: Insufficient documentation

## 2015-02-20 DIAGNOSIS — F329 Major depressive disorder, single episode, unspecified: Secondary | ICD-10-CM | POA: Diagnosis not present

## 2015-02-20 DIAGNOSIS — F319 Bipolar disorder, unspecified: Secondary | ICD-10-CM | POA: Diagnosis not present

## 2015-02-20 DIAGNOSIS — O26892 Other specified pregnancy related conditions, second trimester: Principal | ICD-10-CM | POA: Insufficient documentation

## 2015-02-20 DIAGNOSIS — Z3A27 27 weeks gestation of pregnancy: Secondary | ICD-10-CM | POA: Insufficient documentation

## 2015-02-20 DIAGNOSIS — O99342 Other mental disorders complicating pregnancy, second trimester: Secondary | ICD-10-CM | POA: Diagnosis not present

## 2015-02-20 DIAGNOSIS — R51 Headache: Secondary | ICD-10-CM | POA: Diagnosis present

## 2015-02-20 DIAGNOSIS — O26899 Other specified pregnancy related conditions, unspecified trimester: Secondary | ICD-10-CM | POA: Diagnosis present

## 2015-02-20 LAB — CBC WITH DIFFERENTIAL/PLATELET
Basophils Absolute: 0 10*3/uL (ref 0–0.1)
Basophils Relative: 0 %
EOS PCT: 1 %
Eosinophils Absolute: 0.1 10*3/uL (ref 0–0.7)
HEMATOCRIT: 38.3 % (ref 35.0–47.0)
Hemoglobin: 12.4 g/dL (ref 12.0–16.0)
LYMPHS PCT: 30 %
Lymphs Abs: 2 10*3/uL (ref 1.0–3.6)
MCH: 26.3 pg (ref 26.0–34.0)
MCHC: 32.3 g/dL (ref 32.0–36.0)
MCV: 81.3 fL (ref 80.0–100.0)
MONO ABS: 0.3 10*3/uL (ref 0.2–0.9)
Monocytes Relative: 5 %
Neutro Abs: 4.5 10*3/uL (ref 1.4–6.5)
Neutrophils Relative %: 64 %
Platelets: 247 10*3/uL (ref 150–440)
RBC: 4.72 MIL/uL (ref 3.80–5.20)
RDW: 13.9 % (ref 11.5–14.5)
WBC: 6.9 10*3/uL (ref 3.6–11.0)

## 2015-02-20 LAB — COMPREHENSIVE METABOLIC PANEL
ALT: 23 U/L (ref 14–54)
ANION GAP: 7 (ref 5–15)
AST: 20 U/L (ref 15–41)
Albumin: 2.8 g/dL — ABNORMAL LOW (ref 3.5–5.0)
Alkaline Phosphatase: 115 U/L (ref 38–126)
BUN: 6 mg/dL (ref 6–20)
CALCIUM: 8.6 mg/dL — AB (ref 8.9–10.3)
CO2: 23 mmol/L (ref 22–32)
Chloride: 107 mmol/L (ref 101–111)
Creatinine, Ser: 0.66 mg/dL (ref 0.44–1.00)
GFR calc Af Amer: >60 mL/min (ref >60)
GFR calc non Af Amer: >60 mL/min (ref >60)
Glucose, Bld: 100 mg/dL — ABNORMAL HIGH (ref 65–99)
Potassium: 2.9 mmol/L — CL (ref 3.5–5.1)
Sodium: 137 mmol/L (ref 135–145)
TOTAL PROTEIN: 6.6 g/dL (ref 6.5–8.1)

## 2015-02-20 LAB — URINE DRUG SCREEN, QUALITATIVE (ARMC ONLY)
AMPHETAMINES, UR SCREEN: NOT DETECTED
Barbiturates, Ur Screen: NOT DETECTED
Benzodiazepine, Ur Scrn: NOT DETECTED
Cannabinoid 50 Ng, Ur ~~LOC~~: NOT DETECTED
Cocaine Metabolite,Ur ~~LOC~~: NOT DETECTED
MDMA (ECSTASY) UR SCREEN: NOT DETECTED
Methadone Scn, Ur: NOT DETECTED
OPIATE, UR SCREEN: NOT DETECTED
PHENCYCLIDINE (PCP) UR S: NOT DETECTED
Tricyclic, Ur Screen: NOT DETECTED

## 2015-02-20 LAB — PROTEIN / CREATININE RATIO, URINE
CREATININE, URINE: 412 mg/dL
PROTEIN CREATININE RATIO: 18 mg/mg{creat} — AB (ref 0.00–0.15)
TOTAL PROTEIN, URINE: 43.6 mg/dL

## 2015-02-20 LAB — MAGNESIUM: Magnesium: 1.7 mg/dL (ref 1.7–2.4)

## 2015-02-20 MED ORDER — ACETAMINOPHEN 500 MG PO TABS
1000.0000 mg | ORAL_TABLET | Freq: Once | ORAL | Status: AC
  Administered 2015-02-20: 1000 mg via ORAL
  Filled 2015-02-20: qty 2

## 2015-02-20 MED ORDER — POTASSIUM CHLORIDE CRYS ER 20 MEQ PO TBCR
40.0000 meq | EXTENDED_RELEASE_TABLET | Freq: Once | ORAL | Status: AC
  Administered 2015-02-20: 40 meq via ORAL
  Filled 2015-02-20: qty 2

## 2015-02-20 MED ORDER — POTASSIUM CHLORIDE CRYS ER 20 MEQ PO TBCR: 40.0000 meq | EXTENDED_RELEASE_TABLET | Freq: Two times a day (BID) | ORAL | Status: DC

## 2015-02-20 NOTE — Discharge Summary (Addendum)
OB Triage Discharge Summary   24 y.o. G1 @ 1815w0d presenting for HA x 3 days.  Fetal status reassuring. EFM showed normal baseline, moderate variability, no decels, negative toco. Triage course: Serial BPs normal, labs negative, including UDS, except for PC ratio of 1800 and K of 2.9 and normal albumin. Baseline PC ratio in clinic 61 on 11/15/2014. HA improved with tylenol and PO potassium and patient instructed to start 24hr urine next day and bring to clinic for collection and discharged with Rx for PO potassium.  Labs pending: Add on Mg level RTC 1 week Disposition: Home  Homestead Bingharlie Charish Schroepfer, Montez HagemanJr MD [facility]  Pager: [phone]
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
## 2015-02-20 NOTE — Discharge Summary (Signed)
Patient discharged home, discharge instructions given, patient states understanding. Patient left floor in stable condition, denies any other needs at this time. Patient to schedule scheduled OB appointment for [place], pt to start 24 hour urine tomorrow morning and take to appointment on Wednesday.

## 2015-02-20 NOTE — Discharge Instructions (Addendum)
24-Hour Urine Collection HOW DO I DO A 24-HOUR URINE COLLECTION?  When you get up in the morning, urinate in the toilet and flush. Write down the time. This will be your start time on the day of collection and your end time on the next morning.  From then on, collect all of your urine in the plastic jug that is given to you.  Stop collecting your urine 24 hours after you started.  If the plastic jug that is given to you already has liquid in it, that is okay. Do not throw out the liquid or rinse out the jug. Some tests need the liquid to be added to your urine.  Keep your plastic jug cool in an ice chest or keep it in the refrigerator during the test.  When 24 hours are over, bring your plastic jug to the clinic lab. Keep the jug cool in an ice chest while you are bringing it to the lab.   This information is not intended to replace advice given to you by your health care provider. Make sure you discuss any questions you have with your health care provider.   Document Released: 06/21/2008 Document Revised: 04/15/2014 Document Reviewed: 08/18/2013 Elsevier Interactive Patient Education 2016 [employer]  Bring the urine to the office once it's completed     We will discuss your surgery once again in detail at your post-op visit in two to four weeks. If you havent already done so, please call to make your appointment as soon as possible.  [facility] [place] [facility] [place]  [address] [place] [address]  [place], [place] [address] [place], [place] [address]  Phone: [phone] Phone: [phone]  Fax: [phone] Fax: [phone]    Hypertension During Pregnancy    Hypertension is also called high blood pressure. Blood pressure moves blood in your body. Sometimes, the force that moves the blood becomes too strong. When you are pregnant, this condition should be watched carefully. It can cause problems for you and your baby.  HOME CARE  Make and keep all of your doctor visits.  Take  medicine as told by your doctor. Tell your doctor about all medicines you take.  Eat very little salt.  Exercise regularly.  Do not drink alcohol.  Do not smoke.  Do not have drinks with caffeine.  Lie on your left side when resting.  Your health care provider may ask you to take one low-dose aspirin (81mg ) each day. GET HELP RIGHT AWAY IF:  You have bad belly (abdominal) pain.  You have sudden puffiness (swelling) in the hands, ankles, or face.  You gain 4 pounds (1.8 kilograms) or more in 1 week.  You throw up (vomit) repeatedly.  You have bleeding from the vagina.  You do not feel the baby moving as much.  You have a headache.  You have blurred or double vision.  You have muscle twitching or spasms.  You have shortness of breath.  You have blue fingernails and lips.  You have blood in your pee (urine). MAKE SURE YOU:  Understand these instructions.  Will watch your condition.  Will get help right away if you are not doing well or get worse. This information is not intended to replace advice given to you by your health care provider. Make sure you discuss any questions you have with your health care provider.  Document Released: 04/27/2010 Document Revised: 04/15/2014 Document Reviewed: 10/22/2012  Elsevier Interactive Patient Education [employer] Elsevier Inc.

## 2015-03-26 ENCOUNTER — Observation Stay
Admission: EM | Admit: 2015-03-26 | Discharge: 2015-03-26 | Disposition: A | Discharge status: Home / Self Care | Payer: Medicaid Other | Attending: Obstetrics and Gynecology | Admitting: Obstetrics and Gynecology

## 2015-03-26 DIAGNOSIS — Z3A31 31 weeks gestation of pregnancy: Secondary | ICD-10-CM | POA: Insufficient documentation

## 2015-03-26 DIAGNOSIS — R109 Unspecified abdominal pain: Secondary | ICD-10-CM | POA: Diagnosis not present

## 2015-03-26 DIAGNOSIS — O0993 Supervision of high risk pregnancy, unspecified, third trimester: Principal | ICD-10-CM | POA: Insufficient documentation

## 2015-03-26 DIAGNOSIS — O99213 Obesity complicating pregnancy, third trimester: Secondary | ICD-10-CM | POA: Insufficient documentation

## 2015-03-26 DIAGNOSIS — O26893 Other specified pregnancy related conditions, third trimester: Secondary | ICD-10-CM | POA: Insufficient documentation

## 2015-03-26 LAB — CHLAMYDIA/NGC RT PCR (ARMC ONLY)
CHLAMYDIA TR: NOT DETECTED
N gonorrhoeae: NOT DETECTED

## 2015-03-26 LAB — WET PREP, GENITAL
CLUE CELLS WET PREP: NONE SEEN
Sperm: NONE SEEN
TRICH WET PREP: NONE SEEN

## 2015-03-26 LAB — URINALYSIS COMPLETE WITH MICROSCOPIC (ARMC ONLY)
BILIRUBIN URINE: NEGATIVE
GLUCOSE, UA: NEGATIVE mg/dL
HGB URINE DIPSTICK: NEGATIVE
LEUKOCYTES UA: NEGATIVE
NITRITE: NEGATIVE
PH: 5 (ref 5.0–8.0)
Protein, ur: 30 mg/dL — AB
SPECIFIC GRAVITY, URINE: 1.03 (ref 1.005–1.030)

## 2015-03-26 NOTE — Final Progress Note (Signed)
Physician Final Progress Note  Patient ID: Natasha Davidson MRN: [id number] DOB/AGE: 23/09/1990 23 y.o.  Admit date: 03/26/2015 Admitting provider: Conard NovakStephen D Baer Hinton, MD Discharge date: 03/26/2015   Admission Diagnoses:  1) Supervision high risk pregnancy, 3rd trimester 2) obesity affecting pregnancy, 3rd trimester 3) [redacted] weeks gestation 4) history of cesarean delivery, currently pregnant 5) Abdominal pain  Discharge Diagnoses:  1) Supervision high risk pregnancy, 3rd trimester 2) obesity affecting pregnancy, 3rd trimester 3) [redacted] weeks gestation 4) history of cesarean delivery, currently pregnant 5) Abdominal pain  History of Present Illness:  24 y.o. G3P2 female at 4474w6d who presents with abdominal pain and pelvic pressure over the past two days, worsening today.  She notes no inciting event.  She describes the pain as cramping. Nothing makes it worse or better.  The pain is 5/10 in intensity. It does not radiate.  She notes no urinary symptoms, no vaginal symptoms.  She has had recent nausea with emesis.  Denies fevers and chills.  She notes +FM, denies vaginal bleeding and LOF.    Physical Exam: BP 134/85 mmHg  Pulse 117  Temp(Src) 98.7 F (37.1 C) (Oral)  Ht 5\' 4"  (1.626 m)  Wt 274 lb (124.286 kg)  BMI 47.01 kg/m2  LMP 08/15/2014  Gen: NAD Pulm: CTAB CV: RRR VHQ:IONGEXAbd:gravid, nontender Pelvic: NEFG, normal BSU, vagina pink and normally rugated, cervix closed/thick/high Ext: no e/c/t  Consults: None  Significant Findings/ Diagnostic Studies:  Wet Prep Lab Results  Component Value Date   CLUECELLS NONE SEEN 03/26/2015   YEASTWETPREP FEW* 03/26/2015   TRICHWETPREP NONE SEEN 03/26/2015   WBCWETPREP FEW* 03/26/2015   Urinalysis: Lab Results  Component Value Date   APPEARANCEUR CLOUDY* 03/26/2015   GLUCOSEU NEGATIVE 03/26/2015   BILIRUBINUR NEGATIVE 03/26/2015   KETONESUR TRACE* 03/26/2015   LABSPEC 1.030 03/26/2015   HGBUR NEGATIVE 03/26/2015   PHURINE 5.0  03/26/2015   NITRITE NEGATIVE 03/26/2015   LEUKOCYTESUR NEGATIVE 03/26/2015   RBCU 0-5 03/26/2015   WBCU 6-30 03/26/2015   BACTERIA FEW* 03/26/2015   EPIU 6-30* 03/26/2015   MUCOUSUACOMP PRESENT 03/26/2015   CAOXCRYSTALS PRESENT 03/26/2015   Pending: gonorrhea/chlamydia NAAT.  Procedures: NST Baseline 140bpm Variability: moderate Accelerations: present Decelerations: none  Discharge Condition: stable  Disposition: 01-Home or Self Care  Diet: Regular diet  Discharge Activity: Activity as tolerated    Medication List    ASK your doctor about these medications        aspirin 81 MG tablet  Take 81 mg by mouth daily.     potassium chloride SA 20 MEQ tablet  Commonly known as:  K-DUR,KLOR-CON  Take 2 tablets (40 mEq total) by mouth 2 (two) times daily with a meal.     prenatal multivitamin Tabs tablet  Take 1 tablet by mouth daily at 12 noon.       Total time spent taking care of this patient: 30 minutes  Signed: Conard NovakJackson, Ayva Veilleux D, MD 03/26/2015, 4:03 PM

## 2015-04-20 ENCOUNTER — Other Ambulatory Visit: Payer: Self-pay | Admitting: Obstetrics and Gynecology

## 2015-04-20 DIAGNOSIS — O200 Threatened abortion: Secondary | ICD-10-CM

## 2015-05-04 ENCOUNTER — Observation Stay
Admission: EM | Admit: 2015-05-04 | Discharge: 2015-05-04 | Disposition: A | Discharge status: Home / Self Care | Payer: Medicaid Other | Attending: Obstetrics & Gynecology | Admitting: Obstetrics & Gynecology

## 2015-05-04 DIAGNOSIS — O212 Late vomiting of pregnancy: Secondary | ICD-10-CM | POA: Insufficient documentation

## 2015-05-04 DIAGNOSIS — O479 False labor, unspecified: Secondary | ICD-10-CM | POA: Diagnosis present

## 2015-05-04 DIAGNOSIS — E860 Dehydration: Secondary | ICD-10-CM | POA: Diagnosis not present

## 2015-05-04 DIAGNOSIS — Z3A00 Weeks of gestation of pregnancy not specified: Secondary | ICD-10-CM | POA: Diagnosis not present

## 2015-05-04 DIAGNOSIS — O26893 Other specified pregnancy related conditions, third trimester: Secondary | ICD-10-CM | POA: Diagnosis not present

## 2015-05-04 HISTORY — DX: Essential (primary) hypertension: I10

## 2015-05-04 LAB — URINALYSIS COMPLETE WITH MICROSCOPIC (ARMC ONLY)
BILIRUBIN URINE: NEGATIVE
Glucose, UA: NEGATIVE mg/dL
HGB URINE DIPSTICK: NEGATIVE
Nitrite: NEGATIVE
PH: 5 (ref 5.0–8.0)
PROTEIN: 30 mg/dL — AB
Specific Gravity, Urine: 1.033 — ABNORMAL HIGH (ref 1.005–1.030)

## 2015-05-04 MED ORDER — ACETAMINOPHEN 325 MG PO TABS: 650.0000 mg | ORAL_TABLET | ORAL | Status: DC | PRN

## 2015-05-04 MED ORDER — ONDANSETRON HCL 4 MG/2ML IJ SOLN: 4.0000 mg | Freq: Four times a day (QID) | INTRAMUSCULAR | Status: DC | PRN

## 2015-05-04 NOTE — Discharge Planning (Signed)
Discharge instructions reviewed with patient. Discussed PO hydration, follow up appointments, prescriptions, and when to seek medical attention. Patient discharged in stable ambulatory condition with no complaints.

## 2015-05-04 NOTE — Final Progress Note (Signed)
Physician Final Progress Note  Patient ID: Natasha Davidson MRN: [id number] DOB/AGE: [birth year] 24 y.o.  Admit date: 05/04/2015 Admitting provider: Nadara Mustard, MD Discharge date: 05/04/2015  Admission Diagnoses: Lower abdominal Pain.  Pt had several episodes of n/v today.  On no meds for this, hasnt had nausea recently until today.  No VB or LOF.  Discharge Diagnoses:  Active Problems:   Irregular contractions   Dehydration   Nausea and vomiting, third trimester  Procedures: A NST procedure was performed with FHR monitoring and a normal baseline established, appropriate time of 20-40 minutes of evaluation, and accels >2 seen w 15x15 characteristics.  Results show a REACTIVE NST.   Discharge Condition: good.  Fluids and rest, Zofran ODT for nausea  Disposition: 01-Home or Self Care  Diet: Regular diet  Discharge Activity: Activity as tolerated     Medication List    ASK your doctor about these medications        aspirin 81 MG tablet  Take 81 mg by mouth daily.     potassium chloride SA 20 MEQ tablet  Commonly known as:  K-DUR,KLOR-CON  Take 2 tablets (40 mEq total) by mouth 2 (two) times daily with a meal.     prenatal multivitamin Tabs tablet  Take 1 tablet by mouth daily at 12 noon.       ** ZOFRAN ODT every 6 hours as needed for nausea.  Total time spent taking care of this patient: 15 minutes  Signed: Ellanor Feuerstein PAUL 05/04/2015, 11:02 PM

## 2015-05-15 ENCOUNTER — Encounter
Admission: RE | Admit: 2015-05-15 | Discharge: 2015-05-15 | Disposition: A | Discharge status: Home / Self Care | Payer: Medicaid Other | Source: Ambulatory Visit | Attending: Obstetrics and Gynecology | Admitting: Obstetrics and Gynecology

## 2015-05-15 LAB — COMPREHENSIVE METABOLIC PANEL
ALT: 10 U/L — AB (ref 14–54)
AST: 14 U/L — AB (ref 15–41)
Albumin: 2.6 g/dL — ABNORMAL LOW (ref 3.5–5.0)
Alkaline Phosphatase: 196 U/L — ABNORMAL HIGH (ref 38–126)
Anion gap: 7 (ref 5–15)
BUN: 5 mg/dL — ABNORMAL LOW (ref 6–20)
CHLORIDE: 107 mmol/L (ref 101–111)
CO2: 20 mmol/L — AB (ref 22–32)
CREATININE: 0.54 mg/dL (ref 0.44–1.00)
Calcium: 8.6 mg/dL — ABNORMAL LOW (ref 8.9–10.3)
Glucose, Bld: 109 mg/dL — ABNORMAL HIGH (ref 65–99)
POTASSIUM: 3.6 mmol/L (ref 3.5–5.1)
SODIUM: 134 mmol/L — AB (ref 135–145)
Total Bilirubin: 0.4 mg/dL (ref 0.3–1.2)
Total Protein: 6.5 g/dL (ref 6.5–8.1)

## 2015-05-15 LAB — CBC
HCT: 36.3 % (ref 35.0–47.0)
Hemoglobin: 11.8 g/dL — ABNORMAL LOW (ref 12.0–16.0)
MCH: 25.9 pg — ABNORMAL LOW (ref 26.0–34.0)
MCHC: 32.4 g/dL (ref 32.0–36.0)
MCV: 79.8 fL — AB (ref 80.0–100.0)
PLATELETS: 231 10*3/uL (ref 150–440)
RBC: 4.55 MIL/uL (ref 3.80–5.20)
RDW: 15.2 % — AB (ref 11.5–14.5)
WBC: 6.4 10*3/uL (ref 3.6–11.0)

## 2015-05-15 LAB — RAPID HIV SCREEN (HIV 1/2 AB+AG)
HIV 1/2 ANTIBODIES: NONREACTIVE
HIV-1 P24 Antigen - HIV24: NONREACTIVE

## 2015-05-15 LAB — TYPE AND SCREEN
ABO/RH(D): A POS
ANTIBODY SCREEN: NEGATIVE
Extend sample reason: UNDETERMINED

## 2015-05-15 NOTE — OR Nursing (Signed)
Natasha Davidson   Your procedure is scheduled on: 05/16/15   Report to [facility] at 0530 am             [facility]             [address]             [place], [place] [address]  Call this number if you have problems the morning of surgery: [phone]   Remember:   Do not eat food or drink liquids after midnight.     Do not wear jewelry, make-up or nail polish.  Do not wear lotions, powders, or perfumes. You may wear deodorant.  Do not shave prior to surgery.  Do not bring valuables to the hospital.  [facility] is not responsible for any             belongings or valuables.                 Contacts, dentures or bridgework may not be worn into surgery.  Leave suitcase in the car. After surgery it may be brought to your room.  For patients admitted to the hospital, discharge time is determined by your             treatment team.               Special Instructions: Preparing the skin before Cesarean Section              To help prevent the risk of infection at your surgical site, we are providing             you with rinse-free Sage 2% Chlorhexidine Gluconate (HCG) disposable             wipes.               The night before surgery:              1. Shower or bathe with warm water             2. Do not apply lotion or perfume             3. Wait one hour after shower, skin should be dry and cool             4. Open Sage wipe package - 2 disposable cloths are inside             5. Wipe the lower abdomen from the pubic line to the navel and hip bone to hip             bone with one cloth             6. Use the second cloth to wipe the front of the upper thighs             7. Allow the area to dry for one minute. DO NOT RINSE             8. Skin may feel "tacky" for several minutes             9. Dress in freshly laundered, clean clothes           10. Do not shower the morning of surgery    Please read over the following fact sheets  that you were given: Coughing and  Deep Breathing and Surgical Site Infection Prevention

## 2015-05-16 ENCOUNTER — Inpatient Hospital Stay: Payer: Medicaid Other | Admitting: Anesthesiology

## 2015-05-16 ENCOUNTER — Inpatient Hospital Stay
Admission: RE | Admit: 2015-05-16 | Discharge: 2015-05-19 | DRG: 765 | Disposition: A | Discharge status: Home / Self Care | Payer: Medicaid Other | Source: Ambulatory Visit | Attending: Obstetrics and Gynecology | Admitting: Obstetrics and Gynecology

## 2015-05-16 ENCOUNTER — Encounter: Payer: Self-pay | Admitting: *Deleted

## 2015-05-16 ENCOUNTER — Encounter
Admission: RE | Disposition: A | Discharge status: Home / Self Care | Payer: Self-pay | Source: Ambulatory Visit | Attending: Obstetrics and Gynecology

## 2015-05-16 DIAGNOSIS — O34211 Maternal care for low transverse scar from previous cesarean delivery: Principal | ICD-10-CM | POA: Diagnosis present

## 2015-05-16 DIAGNOSIS — Z6841 Body Mass Index (BMI) 40.0 and over, adult: Secondary | ICD-10-CM | POA: Diagnosis not present

## 2015-05-16 DIAGNOSIS — O1092 Unspecified pre-existing hypertension complicating childbirth: Secondary | ICD-10-CM | POA: Diagnosis present

## 2015-05-16 DIAGNOSIS — Z3A39 39 weeks gestation of pregnancy: Secondary | ICD-10-CM | POA: Diagnosis not present

## 2015-05-16 DIAGNOSIS — O34219 Maternal care for unspecified type scar from previous cesarean delivery: Secondary | ICD-10-CM | POA: Diagnosis present

## 2015-05-16 DIAGNOSIS — O99213 Obesity complicating pregnancy, third trimester: Secondary | ICD-10-CM | POA: Diagnosis present

## 2015-05-16 DIAGNOSIS — E669 Obesity, unspecified: Secondary | ICD-10-CM | POA: Diagnosis present

## 2015-05-16 DIAGNOSIS — O99214 Obesity complicating childbirth: Secondary | ICD-10-CM | POA: Diagnosis present

## 2015-05-16 DIAGNOSIS — O0993 Supervision of high risk pregnancy, unspecified, third trimester: Secondary | ICD-10-CM

## 2015-05-16 DIAGNOSIS — Z98891 History of uterine scar from previous surgery: Secondary | ICD-10-CM

## 2015-05-16 LAB — RPR: RPR: NONREACTIVE

## 2015-05-16 LAB — ABO/RH: ABO/RH(D): A POS

## 2015-05-16 SURGERY — Surgical Case
Anesthesia: Spinal

## 2015-05-16 MED ORDER — SODIUM CHLORIDE 0.9% FLUSH
3.0000 mL | INTRAVENOUS | Status: DC | PRN
Start: 2015-05-16 — End: 2015-05-18

## 2015-05-16 MED ORDER — BUPIVACAINE HCL (PF) 0.5 % IJ SOLN
5.0000 mL | Freq: Once | INTRAMUSCULAR | Status: DC
Start: 2015-05-16 — End: 2015-05-16
  Filled 2015-05-16: qty 30

## 2015-05-16 MED ORDER — FENTANYL CITRATE (PF) 100 MCG/2ML IJ SOLN
25.0000 ug | INTRAMUSCULAR | Status: AC | PRN
  Administered 2015-05-16 (×6): 25 ug via INTRAVENOUS
  Filled 2015-05-16: qty 2

## 2015-05-16 MED ORDER — ONDANSETRON HCL 4 MG/2ML IJ SOLN
INTRAMUSCULAR | Status: DC | PRN
  Administered 2015-05-16: 4 mg via INTRAVENOUS

## 2015-05-16 MED ORDER — PRENATAL MULTIVITAMIN CH
1.0000 | ORAL_TABLET | Freq: Every day | ORAL | Status: DC
  Administered 2015-05-17 – 2015-05-18 (×2): 1 via ORAL
  Filled 2015-05-16 (×2): qty 1

## 2015-05-16 MED ORDER — BUPIVACAINE 0.25 % ON-Q PUMP DUAL CATH 400 ML: 400.0000 mL | INJECTION | Status: DC

## 2015-05-16 MED ORDER — NALOXONE HCL 0.4 MG/ML IJ SOLN: 0.4000 mg | INTRAMUSCULAR | Status: DC | PRN

## 2015-05-16 MED ORDER — WITCH HAZEL-GLYCERIN EX PADS
1.0000 | MEDICATED_PAD | CUTANEOUS | Status: DC | PRN
Start: 2015-05-16 — End: 2015-05-19

## 2015-05-16 MED ORDER — OXYTOCIN 40 UNITS IN LACTATED RINGERS INFUSION - SIMPLE MED
2.5000 [IU]/h | INTRAVENOUS | Status: DC
  Filled 2015-05-16: qty 1000

## 2015-05-16 MED ORDER — NALBUPHINE HCL 10 MG/ML IJ SOLN: 5.0000 mg | INTRAMUSCULAR | Status: DC | PRN

## 2015-05-16 MED ORDER — IBUPROFEN 600 MG PO TABS
600.0000 mg | ORAL_TABLET | Freq: Four times a day (QID) | ORAL | Status: DC
  Administered 2015-05-16: 600 mg via ORAL
  Filled 2015-05-16: qty 1

## 2015-05-16 MED ORDER — NALBUPHINE HCL 10 MG/ML IJ SOLN: 5.0000 mg | Freq: Once | INTRAMUSCULAR | Status: DC | PRN

## 2015-05-16 MED ORDER — FENTANYL CITRATE (PF) 100 MCG/2ML IJ SOLN
INTRAMUSCULAR | Status: AC
  Administered 2015-05-16: 25 ug via INTRAVENOUS
  Filled 2015-05-16: qty 2

## 2015-05-16 MED ORDER — BUPIVACAINE HCL (PF) 0.5 % IJ SOLN
5.0000 mL | Freq: Once | INTRAMUSCULAR | Status: DC
Start: 2015-05-16 — End: 2015-05-16

## 2015-05-16 MED ORDER — LACTATED RINGERS IV SOLN: INTRAVENOUS | Status: DC

## 2015-05-16 MED ORDER — LANOLIN HYDROUS EX OINT: 1.0000 "application " | TOPICAL_OINTMENT | CUTANEOUS | Status: DC | PRN

## 2015-05-16 MED ORDER — MEPERIDINE HCL 25 MG/ML IJ SOLN: 6.2500 mg | INTRAMUSCULAR | Status: DC | PRN

## 2015-05-16 MED ORDER — IBUPROFEN 600 MG PO TABS
600.0000 mg | ORAL_TABLET | Freq: Four times a day (QID) | ORAL | Status: DC | PRN
  Administered 2015-05-17 – 2015-05-19 (×8): 600 mg via ORAL
  Filled 2015-05-16 (×8): qty 1

## 2015-05-16 MED ORDER — NALOXONE HCL 2 MG/2ML IJ SOSY: 1.0000 ug/kg/h | PREFILLED_SYRINGE | INTRAVENOUS | Status: DC | PRN

## 2015-05-16 MED ORDER — SIMETHICONE 80 MG PO CHEW
80.0000 mg | CHEWABLE_TABLET | Freq: Three times a day (TID) | ORAL | Status: DC
  Administered 2015-05-16 – 2015-05-18 (×5): 80 mg via ORAL
  Filled 2015-05-16 (×5): qty 1

## 2015-05-16 MED ORDER — OXYTOCIN 40 UNITS IN LACTATED RINGERS INFUSION - SIMPLE MED
INTRAVENOUS | Status: AC
Start: 2015-05-16 — End: 2015-05-16
  Filled 2015-05-16: qty 1000

## 2015-05-16 MED ORDER — ONDANSETRON HCL 4 MG/2ML IJ SOLN
4.0000 mg | Freq: Once | INTRAMUSCULAR | Status: DC | PRN
  Filled 2015-05-16: qty 2

## 2015-05-16 MED ORDER — OXYTOCIN 10 UNIT/ML IJ SOLN
40.0000 [IU] | INTRAVENOUS | Status: DC | PRN
  Administered 2015-05-16: 40 [IU] via INTRAVENOUS

## 2015-05-16 MED ORDER — CITRIC ACID-SODIUM CITRATE 334-500 MG/5ML PO SOLN
ORAL | Status: AC
  Administered 2015-05-16: 30 mL
  Filled 2015-05-16: qty 15

## 2015-05-16 MED ORDER — PHENYLEPHRINE HCL 10 MG/ML IJ SOLN
INTRAMUSCULAR | Status: DC | PRN
  Administered 2015-05-16 (×6): 100 ug via INTRAVENOUS

## 2015-05-16 MED ORDER — SCOPOLAMINE 1 MG/3DAYS TD PT72
1.0000 | MEDICATED_PATCH | Freq: Once | TRANSDERMAL | Status: AC
  Administered 2015-05-16: 1.5 mg via TRANSDERMAL
  Filled 2015-05-16: qty 1

## 2015-05-16 MED ORDER — FERROUS SULFATE 325 (65 FE) MG PO TABS
325.0000 mg | ORAL_TABLET | Freq: Two times a day (BID) | ORAL | Status: DC
  Administered 2015-05-16 – 2015-05-18 (×3): 325 mg via ORAL
  Filled 2015-05-16 (×3): qty 1

## 2015-05-16 MED ORDER — SENNOSIDES-DOCUSATE SODIUM 8.6-50 MG PO TABS
2.0000 | ORAL_TABLET | ORAL | Status: DC
Start: 2015-05-17 — End: 2015-05-19
  Administered 2015-05-17: 2 via ORAL
  Filled 2015-05-16: qty 2

## 2015-05-16 MED ORDER — LACTATED RINGERS IV SOLN
INTRAVENOUS | Status: DC | PRN
  Administered 2015-05-16: 08:00:00 via INTRAVENOUS

## 2015-05-16 MED ORDER — BUPIVACAINE IN DEXTROSE 0.75-8.25 % IT SOLN
INTRATHECAL | Status: DC | PRN
  Administered 2015-05-16: 1.6 mL via INTRATHECAL

## 2015-05-16 MED ORDER — MENTHOL 3 MG MT LOZG: 1.0000 | LOZENGE | OROMUCOSAL | Status: DC | PRN

## 2015-05-16 MED ORDER — DIPHENHYDRAMINE HCL 25 MG PO CAPS
25.0000 mg | ORAL_CAPSULE | Freq: Four times a day (QID) | ORAL | Status: DC | PRN
  Filled 2015-05-16: qty 1

## 2015-05-16 MED ORDER — ONDANSETRON HCL 4 MG/2ML IJ SOLN
4.0000 mg | Freq: Three times a day (TID) | INTRAMUSCULAR | Status: DC | PRN
  Administered 2015-05-16: 4 mg via INTRAVENOUS

## 2015-05-16 MED ORDER — DIBUCAINE 1 % RE OINT: 1.0000 "application " | TOPICAL_OINTMENT | RECTAL | Status: DC | PRN

## 2015-05-16 MED ORDER — BUPIVACAINE ON-Q PAIN PUMP (FOR ORDER SET NO CHG)
INJECTION | Status: DC
  Filled 2015-05-16: qty 1

## 2015-05-16 MED ORDER — DIPHENHYDRAMINE HCL 25 MG PO CAPS
25.0000 mg | ORAL_CAPSULE | ORAL | Status: DC | PRN
  Administered 2015-05-16: 25 mg via ORAL
  Filled 2015-05-16 (×2): qty 1

## 2015-05-16 MED ORDER — MORPHINE SULFATE (PF) 0.5 MG/ML IJ SOLN
INTRAMUSCULAR | Status: DC | PRN
  Administered 2015-05-16: .2 mg via EPIDURAL

## 2015-05-16 MED ORDER — OXYCODONE HCL 5 MG PO TABS
5.0000 mg | ORAL_TABLET | ORAL | Status: AC | PRN
Start: 2015-05-16 — End: 2015-05-17
  Administered 2015-05-16 – 2015-05-17 (×6): 5 mg via ORAL
  Filled 2015-05-16 (×6): qty 1

## 2015-05-16 MED ORDER — CEFAZOLIN SODIUM-DEXTROSE 2-3 GM-% IV SOLR: INTRAVENOUS | Status: DC | PRN

## 2015-05-16 MED ORDER — DEXTROSE 5 % IV SOLN
3.0000 g | INTRAVENOUS | Status: AC
  Administered 2015-05-16 (×2): 3 g via INTRAVENOUS
  Filled 2015-05-16: qty 3000

## 2015-05-16 MED ORDER — DIPHENHYDRAMINE HCL 50 MG/ML IJ SOLN: 12.5000 mg | INTRAMUSCULAR | Status: DC | PRN

## 2015-05-16 SURGICAL SUPPLY — 33 items
CANISTER SUCT 3000ML (MISCELLANEOUS) ×2 IMPLANT
CATH KIT ON-Q SILVERSOAK 5 (CATHETERS) ×2 IMPLANT
CATH KIT ON-Q SILVERSOAK 5IN (CATHETERS) ×4 IMPLANT
DRSG OPSITE POSTOP 4X8 (GAUZE/BANDAGES/DRESSINGS) ×1 IMPLANT
DRSG TELFA 3X8 NADH (GAUZE/BANDAGES/DRESSINGS) ×2 IMPLANT
ELECT CAUTERY BLADE 6.4 (BLADE) ×1 IMPLANT
ELECT REM PT RETURN 9FT ADLT (ELECTROSURGICAL) ×2
ELECTRODE REM PT RTRN 9FT ADLT (ELECTROSURGICAL) ×1 IMPLANT
GAUZE SPONGE 4X4 12PLY STRL (GAUZE/BANDAGES/DRESSINGS) ×2 IMPLANT
GLOVE BIO SURGEON STRL SZ7 (GLOVE) ×6 IMPLANT
GLOVE INDICATOR 7.5 STRL GRN (GLOVE) ×6 IMPLANT
GOWN STRL REUS W/ TWL LRG LVL3 (GOWN DISPOSABLE) ×3 IMPLANT
GOWN STRL REUS W/TWL LRG LVL3 (GOWN DISPOSABLE) ×6
HEMOSTAT ARISTA ABSORB 1G (Miscellaneous) ×1 IMPLANT
LIQUID BAND (GAUZE/BANDAGES/DRESSINGS) ×2 IMPLANT
NS IRRIG 1000ML POUR BTL (IV SOLUTION) ×2 IMPLANT
PACK C SECTION AR (MISCELLANEOUS) ×2 IMPLANT
PAD DRESSING TELFA 3X8 NADH (GAUZE/BANDAGES/DRESSINGS) ×1 IMPLANT
PAD OB MATERNITY 4.3X12.25 (PERSONAL CARE ITEMS) ×4 IMPLANT
PAD PREP 24X41 OB/GYN DISP (PERSONAL CARE ITEMS) ×2 IMPLANT
SPONGE LAP 18X18 5 PK (GAUZE/BANDAGES/DRESSINGS) ×4 IMPLANT
STRIP CLOSURE SKIN 1/2X4 (GAUZE/BANDAGES/DRESSINGS) ×2 IMPLANT
SUCT VACUUM KIWI BELL (SUCTIONS) ×1 IMPLANT
SUT CHROMIC GUT BROWN 0 54 (SUTURE) ×1 IMPLANT
SUT CHROMIC GUT BROWN 0 54IN (SUTURE)
SUT MNCRL 4-0 (SUTURE) ×2
SUT MNCRL 4-0 27XMFL (SUTURE) ×1
SUT PDS AB 1 TP1 96 (SUTURE) ×2 IMPLANT
SUT PLAIN 2 0 XLH (SUTURE) ×2 IMPLANT
SUT VIC AB 0 CT1 36 (SUTURE) ×8 IMPLANT
SUT VICRYL 2-0 SH 8X27 (SUTURE) ×1 IMPLANT
SUTURE MNCRL 4-0 27XMF (SUTURE) ×1 IMPLANT
SWABSTK COMLB BENZOIN TINCTURE (MISCELLANEOUS) ×2 IMPLANT

## 2015-05-16 NOTE — Anesthesia Procedure Notes (Signed)
Spinal Patient location during procedure: OR Start time: 05/16/2015 7:40 AM End time: 05/16/2015 7:51 AM Staffing Anesthesiologist: Katy Fitch K Performed by: anesthesiologist  Preanesthetic Checklist Completed: patient identified, site marked, surgical consent, pre-op evaluation, timeout performed, IV checked, risks and benefits discussed and monitors and equipment checked Spinal Block Patient position: sitting Prep: Betadine Patient monitoring: heart rate, continuous pulse ox, blood pressure and cardiac monitor Approach: midline Location: L4-5 Injection technique: single-shot Needle Needle type: Whitacre and Introducer  Needle gauge: 25 G Needle length: 12.7 cm Needle insertion depth: 11 cm Additional Notes Negative paresthesia. Negative blood return. Positive free-flowing CSF. Expiration date of kit checked and confirmed. Patient tolerated procedure well, without complications.

## 2015-05-16 NOTE — Anesthesia Preprocedure Evaluation (Signed)
Anesthesia Evaluation  Patient identified by MRN, date of birth, ID band Patient awake    Reviewed: Allergy & Precautions, NPO status , Patient's Chart, lab work & pertinent test results  History of Anesthesia Complications Negative for: history of anesthetic complications  Airway Mallampati: II       Dental  (+) Teeth Intact   Pulmonary neg pulmonary ROS,           Cardiovascular hypertension, Pt. on medications      Neuro/Psych negative neurological ROS     GI/Hepatic negative GI ROS, Neg liver ROS, GERD  ,  Endo/Other  negative endocrine ROS  Renal/GU negative Renal ROS     Musculoskeletal   Abdominal   Peds  Hematology negative hematology ROS (+)   Anesthesia Other Findings   Reproductive/Obstetrics                             Anesthesia Physical Anesthesia Plan  ASA: II  Anesthesia Plan: Spinal   Post-op Pain Management:    Induction:   Airway Management Planned:   Additional Equipment:   Intra-op Plan:   Post-operative Plan:   Informed Consent: I have reviewed the patients History and Physical, chart, labs and discussed the procedure including the risks, benefits and alternatives for the proposed anesthesia with the patient or authorized representative who has indicated his/her understanding and acceptance.     Plan Discussed with:   Anesthesia Plan Comments:         Anesthesia Quick Evaluation

## 2015-05-16 NOTE — H&P (Signed)
History and Physical Interval Note:  Natasha Davidson  has presented today for surgery, with the diagnosis of PRIOR CSECTION  The various methods of treatment have been discussed with the patient and family. After consideration of risks, benefits and other options for treatment, the patient has consented to  Procedure(s): CESAREAN SECTION (N/A) as a surgical intervention .  The patient's history has been reviewed, patient examined, no change in status, stable for surgery.  I have reviewed the patient's chart and labs.  Questions were answered to the patient's satisfaction.    No beta blockers indicated and she takes none on a regular basis.  Conard Novak, MD 05/16/2015 7:25 AM

## 2015-05-16 NOTE — Op Note (Signed)
Cesarean Section Procedure Note   Natasha Davidson   05/16/2015   Pre-operative Diagnosis:  Patient Active Problem List   Diagnosis Date Noted  . Supervision of high risk pregnancy in third trimester 05/16/2015  . Obesity affecting pregnancy in third trimester, antepartum 05/16/2015  . BMI 40.0-44.9, adult (HCC) 05/16/2015  . History of cesarean section complicating pregnancy 05/16/2015   Post-operative Diagnosis:  Patient Active Problem List   Diagnosis Date Noted  . Supervision of high risk pregnancy in third trimester 05/16/2015  . Obesity affecting pregnancy in third trimester, antepartum 05/16/2015  . BMI 40.0-44.9, adult (HCC) 05/16/2015  . History of cesarean section complicating pregnancy 05/16/2015   Procedure: Repeat low transverse cesarean delivery by Pfannenstiel incision with double layer uterine closure  Surgeon: Surgeon(s) and Role:    * Conard Novak, MD - Primary    * Harding Bing, MD - Assisting   Assistants: Alveria Apley, PA-S  Anesthesia: spinal   Findings:  1) Dense adhesions of anterior uterus to anterior abdominal wall 2) viable female infant with APGARs of 7 at 1 minute and 9 at 5 minutes   Estimated Blood Loss: 1,000 mL  Total IV Fluids: 1,400 ml   Urine Output: 200 mL clear urine at end of case  Specimens: None  Complications: no complications  Disposition: PACU - hemodynamically stable.   Maternal Condition: stable   Baby condition / location:  Couplet care / Skin to Skin  Procedure Details:  The patient was seen in the Holding Room. The risks, benefits, complications, treatment options, and expected outcomes were discussed with the patient. The patient concurred with the proposed plan, giving informed consent. identified as Natasha Davidson and the procedure verified as C-Section Delivery. A Time Out was held and the above information confirmed.   After induction of anesthesia, the patient was draped and prepped in the usual  sterile manner. A Pfannenstiel incision was made and carried down through the subcutaneous tissue to the fascia. Fascial incision was made and extended transversely. The fascia was separated from the underlying rectus tissue superiorly and inferiorly. The peritoneum was identified and entered. Peritoneal incision was extended longitudinally. The bladder flap was not bluntly freed from the lower uterine segment. A low transverse uterine incision was made and the hysterotomy was extended with cranial-caudal tension. Delivered from cephalic presentation using a Kiwi flat vacuum given the fetal head was very high in the uterus and not descending well with fundal pressure.  Once the head was delivered, the vacuum was removed and a 3,570 gram (7 lb 14 oz) Living newborn infant(s) or Female with Apgar scores of 7 at one minute and 9 at five minutes. Cord ph was not sent the umbilical cord was clamped and cut cord blood was not obtained for evaluation. The placenta was removed Intact and appeared normal. The uterus could not be exteriorized due to the aforementioned dense adhesive tissue.  The uterus was cleared of all clots and any debris. The uterine incision was closed with running locked sutures of 0 Vicryl.  A second layer of the same suture was thrown in an imbricating fashion.  Hemostasis was assured.  A running suture of 3-0 vicryl was used to close a serosal defect that was not hemostatic.  Hemostasis was obtained and Arista 1 gram was used to assure continued hemostasis. The rectus muscles were inspected and found to be hemostatic.  The fascia was then reapproximated with running sutures of 1-0 PDS, looped. The subcutaneous tissue was reapproximated  using 2-0 plain gut such that no greater than 2cm of dead space remained. The subcuticular closure was performed using 4-0 monocryl. The skin closure was reinforced using benzoin and 1/2" steri-strips.  Instrument, sponge, and needle counts were correct prior the  abdominal closure and were correct at the conclusion of the case.  The patient received Ancef 3 gram IV prior to skin incision (within 30 minutes). For VTE prophylaxis she was wearing SCDs throughout the case.  Signed: Conard Novak, MD 05/16/2015 9:21 AM

## 2015-05-16 NOTE — Transfer of Care (Signed)
Immediate Anesthesia Transfer of Care Note  Patient: Natasha Davidson  Procedure(s) Performed: Procedure(s): CESAREAN SECTION (N/A)  Patient Location: PACU  Anesthesia Type:Spinal  Level of Consciousness: awake, alert  and oriented  Airway & Oxygen Therapy: Patient Spontanous Breathing and Patient connected to face mask oxygen  Post-op Assessment: Report given to RN and Post -op Vital signs reviewed and stable  Post vital signs: Reviewed and stable  Last Vitals:  Filed Vitals:   05/16/15 0600 05/16/15 0926  BP: 127/88 110/59  Pulse: 104 92  Temp: 36.6 C 36.1 C  Resp: 18 14    Complications: No apparent anesthesia complications

## 2015-05-16 NOTE — Discharge Summary (Signed)
OB Discharge Summary  Patient Name: Natasha Davidson DOB: [birth year] MRN: [id number]  Date of admission: 05/16/2015 Delivering MD: Thomasene Mohair, MD Date of Delivery: 05/16/2015  Date of discharge: 05/19/2015  Admitting diagnosis: PRIOR CSECTION    Intrauterine pregnancy: [redacted]w[redacted]d      Secondary diagnosis: Chronic Hypertension and obesity with BMI in 40s     Discharge diagnosis: Term Pregnancy Delivered and CHTN                                                                                                Post partum procedures:none  Augmentation: n/a  Complications: None  Hospital course:  Sceduled C/S   25 y.o. yo G3P3001 at [redacted]w[redacted]d was admitted to the hospital 05/16/2015 for scheduled cesarean section with the following indication:Elective Repeat.  Patient delivered a Viable infant.05/16/2015  Details of operation can be found in separate operative note.  Pateint had an uncomplicated postpartum course.  She is ambulating, tolerating a regular diet, passing flatus, and urinating well. Patient is discharged home in stable condition on  05/19/2015          Physical exam  Filed Vitals:   05/18/15 1955 05/19/15 0009 05/19/15 0347 05/19/15 0801  BP: 127/87   124/80  Pulse: 97   89  Temp: 97.7 F (36.5 C) 98.2 F (36.8 C) 98.4 F (36.9 C) 98.4 F (36.9 C)  TempSrc: Oral Oral Oral Oral  Resp: 18   20  Height:      Weight:      SpO2:    100%   General: alert, cooperative and no distress Lochia: appropriate Uterine Fundus: firm Incision: Healing well with no significant drainage DVT Evaluation: No evidence of DVT seen on physical exam.  Labs: Lab Results  Component Value Date   WBC 8.0 05/17/2015   HGB 11.0* 05/17/2015   HCT 34.3* 05/17/2015   MCV 80.9 05/17/2015   PLT 204 05/17/2015   CMP Latest Ref Rng 05/15/2015  Glucose 65 - 99 mg/dL 409(W)  BUN 6 - 20 mg/dL 5(L)  Creatinine 1.19 - 1.00 mg/dL 1.47  Sodium 829 - 562 mmol/L 134(L)  Potassium 3.5 - 5.1 mmol/L 3.6   Chloride 101 - 111 mmol/L 107  CO2 22 - 32 mmol/L 20(L)  Calcium 8.9 - 10.3 mg/dL 1.3(Y)  Total Protein 6.5 - 8.1 g/dL 6.5  Total Bilirubin 0.3 - 1.2 mg/dL 0.4  Alkaline Phos 38 - 126 U/L 196(H)  AST 15 - 41 U/L 14(L)  ALT 14 - 54 U/L 10(L)    Discharge instruction: per After Visit Summary.  Medications:    Medication List    TAKE these medications        docusate sodium 100 MG capsule  Commonly known as:  COLACE  Take 1 capsule (100 mg total) by mouth daily.     oxyCODONE-acetaminophen 5-325 MG tablet  Commonly known as:  PERCOCET/ROXICET  Take 1-2 tablets by mouth every 4 (four) hours as needed for moderate pain or severe pain.        Diet: routine diet  Activity: Advance as tolerated. Pelvic rest for 6 weeks.  Outpatient follow up: No Follow-up on file.  Postpartum contraception: Nexplanon Rhogam Given postpartum: n/a Rubella vaccine given postpartum: no Varicella vaccine given postpartum: no TDaP given antepartum or postpartum: AP Flu vaccine given antepartum or postpartum: No  Newborn Data: Live born female  Birth Weight: 7 lb 13.9 oz (3570 g) APGAR: 7, 9  Baby Feeding: Bottle  Disposition:home with mother  SIGNED: Tresea Mall, CNM   This patient and plan were discussed with Dr Jean Rosenthal 05/19/2015

## 2015-05-17 LAB — CBC
HCT: 34.3 % — ABNORMAL LOW (ref 35.0–47.0)
HEMOGLOBIN: 11 g/dL — AB (ref 12.0–16.0)
MCH: 26 pg (ref 26.0–34.0)
MCHC: 32.2 g/dL (ref 32.0–36.0)
MCV: 80.9 fL (ref 80.0–100.0)
PLATELETS: 204 10*3/uL (ref 150–440)
RBC: 4.24 MIL/uL (ref 3.80–5.20)
RDW: 15.1 % — AB (ref 11.5–14.5)
WBC: 8 10*3/uL (ref 3.6–11.0)

## 2015-05-17 MED ORDER — NALBUPHINE HCL 10 MG/ML IJ SOLN: 5.0000 mg | Freq: Once | INTRAMUSCULAR | Status: DC | PRN

## 2015-05-17 MED ORDER — NALOXONE HCL 0.4 MG/ML IJ SOLN: 0.4000 mg | INTRAMUSCULAR | Status: DC | PRN

## 2015-05-17 MED ORDER — DIPHENHYDRAMINE HCL 50 MG/ML IJ SOLN: 12.5000 mg | INTRAMUSCULAR | Status: DC | PRN

## 2015-05-17 MED ORDER — KETOROLAC TROMETHAMINE 30 MG/ML IJ SOLN: 30.0000 mg | Freq: Four times a day (QID) | INTRAMUSCULAR | Status: DC | PRN

## 2015-05-17 MED ORDER — NALBUPHINE HCL 10 MG/ML IJ SOLN: 5.0000 mg | INTRAMUSCULAR | Status: DC | PRN

## 2015-05-17 MED ORDER — SODIUM CHLORIDE 0.9% FLUSH: 3.0000 mL | INTRAVENOUS | Status: DC | PRN

## 2015-05-17 MED ORDER — NALOXONE HCL 2 MG/2ML IJ SOSY: 1.0000 ug/kg/h | PREFILLED_SYRINGE | INTRAMUSCULAR | Status: DC | PRN

## 2015-05-17 MED ORDER — ONDANSETRON HCL 4 MG/2ML IJ SOLN: 4.0000 mg | Freq: Three times a day (TID) | INTRAMUSCULAR | Status: DC | PRN

## 2015-05-17 MED ORDER — DIPHENHYDRAMINE HCL 25 MG PO CAPS
25.0000 mg | ORAL_CAPSULE | ORAL | Status: DC | PRN
  Administered 2015-05-17: 25 mg via ORAL

## 2015-05-17 MED ORDER — ACETAMINOPHEN 500 MG PO TABS
1000.0000 mg | ORAL_TABLET | Freq: Four times a day (QID) | ORAL | Status: DC
  Administered 2015-05-17: 1000 mg via ORAL
  Filled 2015-05-17: qty 2

## 2015-05-17 NOTE — Anesthesia Post-op Follow-up Note (Signed)
  Anesthesia Pain Follow-up Note  Patient: Natasha Davidson  Day #: 1  Date of Follow-up: 05/17/2015 Time: 7:45 AM  Last Vitals:  Filed Vitals:   05/16/15 2354 05/17/15 0446  BP: 127/71 118/69  Pulse: 88 72  Temp: 36.9 C 36.6 C  Resp: 18 20    Level of Consciousness: alert  Pain: 3 /10   Side Effects:None  Catheter Site Exam:clean, dry, no drainage  Plan: D/C from anesthesia care  Starling Manns

## 2015-05-17 NOTE — Clinical Social Work Note (Signed)
CSW aware of consult for financial concerns. CSW has spoken to patient's nurse and patient is doing well and has no immediate concerns. CSW will see patient in the morning and complete full assessment.  York Spaniel MSW,LCSW

## 2015-05-17 NOTE — Progress Notes (Signed)
  Post-operative Day 1  Subjective: no complaints, up ad lib, voiding and tolerating PO  Has not passed flatus yet  Objective: Blood pressure 118/79, pulse 89, temperature 98 F (36.7 C), temperature source Oral, resp. rate 18, height  (1.626 m), weight 250 lb (113.399 kg), last menstrual period 08/15/2014, SpO2 99 %,  Physical Exam:  General: alert and cooperative Lochia: appropriate Uterine Fundus: firm Incision: no significant drainage, dressing dry & intact DVT Evaluation: No evidence of DVT seen on physical exam. Abdomen: soft, NT   Recent Labs  05/15/15 1210 05/17/15 0652  HGB 11.8* 11.0*  HCT 36.3 34.3*    Assessment POD #1, repeat LTCS  Plan: Continue PO care, Advance activity as tolerated and Discharge POD 2 or 3  Feeding: bottle Contraception: nexplanon Blood Type: A+ RI/VI TDAP utd Flu needs prior to discharge    Marta Antu, [place] 05/17/2015, 11:29 AM

## 2015-05-17 NOTE — Anesthesia Postprocedure Evaluation (Signed)
Anesthesia Post Note  Patient: Natasha Davidson  Procedure(s) Performed: Procedure(s) (LRB): CESAREAN SECTION (N/A)  Patient location during evaluation: Mother Baby Anesthesia Type: Spinal Level of consciousness: awake and alert Pain management: pain level controlled Vital Signs Assessment: post-procedure vital signs reviewed and stable Respiratory status: spontaneous breathing and respiratory function stable Cardiovascular status: blood pressure returned to baseline and stable Postop Assessment: spinal receding Anesthetic complications: no    Last Vitals:  Filed Vitals:   05/16/15 2354 05/17/15 0446  BP: 127/71 118/69  Pulse: 88 72  Temp: 36.9 C 36.6 C  Resp: 18 20    Last Pain:  Filed Vitals:   05/17/15 0721  PainSc: 2                  Starling Manns

## 2015-05-18 MED ORDER — DOCUSATE SODIUM 100 MG PO CAPS
100.0000 mg | ORAL_CAPSULE | Freq: Every day | ORAL | Status: DC
  Administered 2015-05-18: 100 mg via ORAL
  Filled 2015-05-18: qty 1

## 2015-05-18 MED ORDER — BISACODYL 10 MG RE SUPP: 10.0000 mg | Freq: Every day | RECTAL | Status: DC | PRN

## 2015-05-18 MED ORDER — OXYCODONE-ACETAMINOPHEN 5-325 MG PO TABS
1.0000 | ORAL_TABLET | ORAL | Status: DC | PRN
  Administered 2015-05-18 – 2015-05-19 (×3): 2 via ORAL
  Filled 2015-05-18 (×3): qty 2

## 2015-05-18 NOTE — Clinical Social Work Maternal (Signed)
  CLINICAL SOCIAL WORK MATERNAL/CHILD NOTE  Patient Details  Name: Natasha Davidson MRN: [id number] Date of Birth: 02-28-1991  Date:  05/18/2015  Clinical Social Worker Initiating Note:  York Spaniel MSW,LCSW Date/ Time Initiated:  05/18/15/      Child's Name:      Legal Guardian:  Mother   Need for Interpreter:  None   Date of Referral:  05/16/15     Reason for Referral:  Other (Comment) (Financial Concerns)   Referral Source:  RN   Address:     Phone number:      Household Members:  Parents   Natural Supports (not living in the home):      Professional Supports:     Employment: Full-time   Type of Work:     Education:  Associate Professor Resources:  Medicaid   Other Resources:      Cultural/Religious Considerations Which May Impact Care:  none  Strengths:  Home prepared for child    Risk Factors/Current Problems:      Cognitive State:  Able to Concentrate , Alert    Mood/Affect:  Calm    CSW Assessment: CSW spoke with patient this morning regarding consult received by nursing for financial concerns. Patient informed me that the reason she wished to speak with a Child psychotherapist was because her OBGYN office at [facility]) had informed her that she could ask CSW at the hospital for a carseat if she did not have one. CSW encouraged patient to find a carseat for her newborn on her own and CSW educated patient that this will be one of many items that she will need to be able to provide for her newborn. CSW informed her that she could contact the fire dept as well. Patient stated she has all necessities at home for her newborn and her other two children that reside in the home. Patient states she resides with her mother as well. Patient had no other concerns at this time.   CSW Plan/Description:  Patient/Family Education     [place], [place] 05/18/2015, 11:58 AM

## 2015-05-18 NOTE — Progress Notes (Signed)
Post operative day #2 repeat LTCS Subjective:   Having some gas pains, has not passed flatus yet. Tolerating a regular diet. Voiding without difficulty. Ambulating in hallway  Objective:  Blood pressure 119/80, pulse 76, temperature 98.7 F (37.1 C), temperature source Oral, resp. rate 18, height   (1.626 m), weight 113.399 kg (250 lb), last menstrual period 08/15/2014, SpO2 99 %,   General: NAD, appears comfortable Pulmonary: no increased work of breathing, CTA Abdomen: non-distended, non-tender, bowel sounds active Incision: dsg intact, small amt blood from perineal pad on dressing Extremities:+1edema, no erythema, no tenderness  Results for orders placed or performed during the hospital encounter of 05/16/15 (from the past 72 hour(s))  CBC     Status: Abnormal   Collection Time: 05/17/15  6:52 AM  Result Value Ref Range   WBC 8.0 3.6 - 11.0 K/uL   RBC 4.24 3.80 - 5.20 MIL/uL   Hemoglobin 11.0 (L) 12.0 - 16.0 g/dL   HCT 16.1 (L) 09.6 - 04.5 %   MCV 80.9 80.0 - 100.0 fL   MCH 26.0 26.0 - 34.0 pg   MCHC 32.2 32.0 - 36.0 g/dL   RDW 40.9 (H) 81.1 - 91.4 %   Platelets 204 150 - 440 K/uL     Assessment:   25 y.o. G3P3001 postoperativeday # 2-stable   Plan:   1.) Has not passed gas yet-will add stool softeners and prn Dulcolax suppository  2) --/--/A POS (02/06 1211) / RI/ VI/   3) TDAP UTD   4) Bottle feeding  5) Disposition-probably tomorrow  Farrel Conners, CNM

## 2015-05-19 MED ORDER — OXYCODONE-ACETAMINOPHEN 5-325 MG PO TABS: 1.0000 | ORAL_TABLET | ORAL | Status: DC | PRN

## 2015-05-19 MED ORDER — DOCUSATE SODIUM 100 MG PO CAPS: 100.0000 mg | ORAL_CAPSULE | Freq: Every day | ORAL | Status: DC

## 2015-05-19 NOTE — Discharge Instructions (Signed)
Incision Care: Keep incision area clean, dry and open to air. Shower daily to prevent infection. Only pat incisions, no rubbing or circling the incision area. Use mild soap and warm water to clean incisions. Make sure to dry area completely. ° °Monitor incision area for redness, severe pain, drainage, or odor, if so contact your physician. ° °No heavy lifting until cleared by physician. ° °Take pain medications to manage pain, if pain is increased or unrelieved by medications contact your physician.  ° °Make sure to stay active, ambulate often. ° °Call your physician if you develop a temperature greater than 100.4, experience any chest pain, shortness of breath. ° °Make sure to follow up with physician within specified time.  °Bleeding: Your bleeding could continue up to 6 weeks, the flow should gradually decrease and the color should become dark then lightened over the next couple of weeks. If you notice you are bleeding heavily or passing clots larger than the size of your fist, PLEASE call your physician. No TAMPONS, DOUCHING, ENEMAS OR SEXUAL INTERCOURSE for 6 weeks.  ° °Stitches: Shower daily with mild soap and water. Stitches will dissolve over the next couple of weeks, if you experience any discomfort in the vaginal area you may sit in warm water 15-20 minutes, 3-4 times per day. Just enough water to cover vaginal area.  ° °AfterPains: This is the uterus contracting back to its normal position and size. Use medications prescribed or recommended by your physician to help relieve this discomfort.  ° °Bowels/Hemorrhoids: Drink plenty of water and stay active. Increase fiber, fresh fruits and vegetables in your diet.  ° °Rest/Activity: Rest when the baby is resting ° °Bathing: Shower daily! ° °Diet: Continue to eat extra calories until your follow up visit to help replenish nutrients and vitamins. If breastfeeding eat an extra 500-1000 and increase your fluid intake to 12 glasses a day.  ° °Contraception: Consult  with your physician on what method of birth control you would like to use.  ° °Postpartum "BLUES": It is common to emotional days after delivery, however if it persist for greater than 2 weeks or if you feel concerned please let your physician know immediately. This is hormone driven and nothing you can control so please let someone know how you feel. ° °Follow Up Visit: Please schedule a follow up visit with your physician.  °

## 2015-05-19 NOTE — Progress Notes (Signed)
Patient understands all discharge instructions and the need to make follow up appointments. Patient discharge via wheelchair with auxillary. 

## 2015-09-09 ENCOUNTER — Emergency Department
Admission: EM | Admit: 2015-09-09 | Discharge: 2015-09-09 | Disposition: A | Discharge status: Home / Self Care | Payer: No Typology Code available for payment source | Attending: Emergency Medicine | Admitting: Emergency Medicine

## 2015-09-09 ENCOUNTER — Emergency Department: Payer: No Typology Code available for payment source

## 2015-09-09 ENCOUNTER — Encounter: Payer: Self-pay | Admitting: Emergency Medicine

## 2015-09-09 DIAGNOSIS — I10 Essential (primary) hypertension: Secondary | ICD-10-CM | POA: Insufficient documentation

## 2015-09-09 DIAGNOSIS — Y92410 Unspecified street and highway as the place of occurrence of the external cause: Secondary | ICD-10-CM | POA: Insufficient documentation

## 2015-09-09 DIAGNOSIS — Y999 Unspecified external cause status: Secondary | ICD-10-CM | POA: Diagnosis not present

## 2015-09-09 DIAGNOSIS — M25511 Pain in right shoulder: Secondary | ICD-10-CM

## 2015-09-09 DIAGNOSIS — Y939 Activity, unspecified: Secondary | ICD-10-CM | POA: Insufficient documentation

## 2015-09-09 MED ORDER — OXYCODONE-ACETAMINOPHEN 5-325 MG PO TABS: 1.0000 | ORAL_TABLET | ORAL | Status: DC | PRN

## 2015-09-09 MED ORDER — HYDROCODONE-ACETAMINOPHEN 5-325 MG PO TABS
1.0000 | ORAL_TABLET | Freq: Once | ORAL | Status: AC
  Administered 2015-09-09: 1 via ORAL
  Filled 2015-09-09: qty 1

## 2015-09-09 MED ORDER — NAPROXEN 500 MG PO TABS: 500.0000 mg | ORAL_TABLET | Freq: Two times a day (BID) | ORAL | Status: DC
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
## 2015-09-09 NOTE — ED Notes (Signed)
E sig pad not working, pt verbalized understanding 

## 2015-09-09 NOTE — Discharge Instructions (Signed)
Cryotherapy °Cryotherapy means treatment with cold. Ice or gel packs can be used to reduce both pain and swelling. Ice is the most helpful within the first 24 to 48 hours after an injury or flare-up from overusing a muscle or joint. Sprains, strains, spasms, burning pain, shooting pain, and aches can all be eased with ice. Ice can also be used when recovering from surgery. Ice is effective, has very few side effects, and is safe for most people to use. °PRECAUTIONS  °Ice is not a safe treatment option for people with: °· Raynaud phenomenon. This is a condition affecting small blood vessels in the extremities. Exposure to cold may cause your problems to return. °· Cold hypersensitivity. There are many forms of cold hypersensitivity, including: °¨ Cold urticaria. Red, itchy hives appear on the skin when the tissues begin to warm after being iced. °¨ Cold erythema. This is a red, itchy rash caused by exposure to cold. °¨ Cold hemoglobinuria. Red blood cells break down when the tissues begin to warm after being iced. The hemoglobin that carry oxygen are passed into the urine because they cannot combine with blood proteins fast enough. °· Numbness or altered sensitivity in the area being iced. °If you have any of the following conditions, do not use ice until you have discussed cryotherapy with your caregiver: °· Heart conditions, such as arrhythmia, angina, or chronic heart disease. °· High blood pressure. °· Healing wounds or open skin in the area being iced. °· Current infections. °· Rheumatoid arthritis. °· Poor circulation. °· Diabetes. °Ice slows the blood flow in the region it is applied. This is beneficial when trying to stop inflamed tissues from spreading irritating chemicals to surrounding tissues. However, if you expose your skin to cold temperatures for too long or without the proper protection, you can damage your skin or nerves. Watch for signs of skin damage due to cold. °HOME CARE INSTRUCTIONS °Follow  these tips to use ice and cold packs safely. °· Place a dry or damp towel between the ice and skin. A damp towel will cool the skin more quickly, so you may need to shorten the time that the ice is used. °· For a more rapid response, add gentle compression to the ice. °· Ice for no more than 10 to 20 minutes at a time. The bonier the area you are icing, the less time it will take to get the benefits of ice. °· Check your skin after 5 minutes to make sure there are no signs of a poor response to cold or skin damage. °· Rest 20 minutes or more between uses. °· Once your skin is numb, you can end your treatment. You can test numbness by very lightly touching your skin. The touch should be so light that you do not see the skin dimple from the pressure of your fingertip. When using ice, most people will feel these normal sensations in this order: cold, burning, aching, and numbness. °· Do not use ice on someone who cannot communicate their responses to pain, such as small children or people with dementia. °HOW TO MAKE AN ICE PACK °Ice packs are the most common way to use ice therapy. Other methods include ice massage, ice baths, and cryosprays. Muscle creams that cause a cold, tingly feeling do not offer the same benefits that ice offers and should not be used as a substitute unless recommended by your caregiver. °To make an ice pack, do one of the following: °· Place crushed ice or a   bag of frozen vegetables in a sealable plastic bag. Squeeze out the excess air. Place this bag inside another plastic bag. Slide the bag into a pillowcase or place a damp towel between your skin and the bag.  Mix 3 parts water with 1 part rubbing alcohol. Freeze the mixture in a sealable plastic bag. When you remove the mixture from the freezer, it will be slushy. Squeeze out the excess air. Place this bag inside another plastic bag. Slide the bag into a pillowcase or place a damp towel between your skin and the bag. SEEK MEDICAL CARE  IF:  You develop white spots on your skin. This may give the skin a blotchy (mottled) appearance.  Your skin turns blue or pale.  Your skin becomes waxy or hard.  Your swelling gets worse. MAKE SURE YOU:   Understand these instructions.  Will watch your condition.  Will get help right away if you are not doing well or get worse.   This information is not intended to replace advice given to you by your health care provider. Make sure you discuss any questions you have with your health care provider.   Document Released: 11/19/2010 Document Revised: 04/15/2014 Document Reviewed: 11/19/2010 Elsevier Interactive Patient Education [employer]2016 Elsevier Inc.    Follow-up with your primary care Dr. Phineas Realharles Drew if any continued problems. Take medication only as directed. Ice or heat to your shoulder as needed for discomfort. Do not take medication and drive or operate machinery.

## 2015-09-17 ENCOUNTER — Emergency Department: Payer: Medicaid Other

## 2015-09-17 ENCOUNTER — Emergency Department
Admission: EM | Admit: 2015-09-17 | Discharge: 2015-09-17 | Disposition: A | Discharge status: Home / Self Care | Payer: Medicaid Other | Attending: Emergency Medicine | Admitting: Emergency Medicine

## 2015-09-17 DIAGNOSIS — I10 Essential (primary) hypertension: Secondary | ICD-10-CM | POA: Insufficient documentation

## 2015-09-17 DIAGNOSIS — B9689 Other specified bacterial agents as the cause of diseases classified elsewhere: Secondary | ICD-10-CM

## 2015-09-17 DIAGNOSIS — R109 Unspecified abdominal pain: Secondary | ICD-10-CM

## 2015-09-17 DIAGNOSIS — R1030 Lower abdominal pain, unspecified: Secondary | ICD-10-CM | POA: Insufficient documentation

## 2015-09-17 DIAGNOSIS — R102 Pelvic and perineal pain: Secondary | ICD-10-CM | POA: Diagnosis present

## 2015-09-17 DIAGNOSIS — N760 Acute vaginitis: Secondary | ICD-10-CM | POA: Insufficient documentation

## 2015-09-17 LAB — WET PREP, GENITAL
Sperm: NONE SEEN
Trich, Wet Prep: NONE SEEN
Yeast Wet Prep HPF POC: NONE SEEN

## 2015-09-17 LAB — POCT PREGNANCY, URINE
PREG TEST UR: NEGATIVE
Preg Test, Ur: NEGATIVE

## 2015-09-17 LAB — CBC
HCT: 39.9 % (ref 35.0–47.0)
HEMOGLOBIN: 13.1 g/dL (ref 12.0–16.0)
MCH: 26.5 pg (ref 26.0–34.0)
MCHC: 32.8 g/dL (ref 32.0–36.0)
MCV: 80.8 fL (ref 80.0–100.0)
PLATELETS: 247 10*3/uL (ref 150–440)
RBC: 4.93 MIL/uL (ref 3.80–5.20)
RDW: 15 % — ABNORMAL HIGH (ref 11.5–14.5)
WBC: 5.3 10*3/uL (ref 3.6–11.0)

## 2015-09-17 LAB — CHLAMYDIA/NGC RT PCR (ARMC ONLY)
Chlamydia Tr: NOT DETECTED
N gonorrhoeae: NOT DETECTED

## 2015-09-17 LAB — HCG, QUANTITATIVE, PREGNANCY: hCG, Beta Chain, Quant, S: 60 m[IU]/mL — ABNORMAL HIGH (ref <5)

## 2015-09-17 MED ORDER — ACETAMINOPHEN 325 MG PO TABS
650.0000 mg | ORAL_TABLET | Freq: Once | ORAL | Status: AC
  Administered 2015-09-17: 650 mg via ORAL
  Filled 2015-09-17: qty 2

## 2015-09-17 MED ORDER — METRONIDAZOLE 500 MG PO TABS: 500.0000 mg | ORAL_TABLET | Freq: Two times a day (BID) | ORAL | Status: AC

## 2015-09-17 NOTE — ED Notes (Signed)
Pt presents w/ c/o blood w/ urination. Pt has positive pregnancy test x 1 day ago. Pt is in no acute distress, c/o suprapubic pain. Pt is [id number]

## 2015-09-17 NOTE — ED Notes (Signed)
Discussed discharge instructions, prescriptions, and follow-up care with patient. No questions or concerns at this time. Pt stable at discharge.  

## 2015-09-17 NOTE — Discharge Instructions (Signed)
Abdominal Pain, Adult Many things can cause abdominal pain. Usually, abdominal pain is not caused by a disease and will improve without treatment. It can often be observed and treated at home. Your health care provider will do a physical exam and possibly order blood tests and X-rays to help determine the seriousness of your pain. However, in many cases, more time must pass before a clear cause of the pain can be found. Before that point, your health care provider may not know if you need more testing or further treatment. HOME CARE INSTRUCTIONS Monitor your abdominal pain for any changes. The following actions may help to alleviate any discomfort you are experiencing:  Only take over-the-counter or prescription medicines as directed by your health care provider.  Do not take laxatives unless directed to do so by your health care provider.  Try a clear liquid diet (broth, tea, or water) as directed by your health care provider. Slowly move to a bland diet as tolerated. SEEK MEDICAL CARE IF:  You have unexplained abdominal pain.  You have abdominal pain associated with nausea or diarrhea.  You have pain when you urinate or have a bowel movement.  You experience abdominal pain that wakes you in the night.  You have abdominal pain that is worsened or improved by eating food.  You have abdominal pain that is worsened with eating fatty foods.  You have a fever. SEEK IMMEDIATE MEDICAL CARE IF:  Your pain does not go away within 2 hours.  You keep throwing up (vomiting).  Your pain is felt only in portions of the abdomen, such as the right side or the left lower portion of the abdomen.  You pass bloody or black tarry stools. MAKE SURE YOU:  Understand these instructions.  Will watch your condition.  Will get help right away if you are not doing well or get worse.   This information is not intended to replace advice given to you by your health care provider. Make sure you discuss  any questions you have with your health care provider.   Document Released: 01/02/2005 Document Revised: 12/14/2014 Document Reviewed: 12/02/2012 Elsevier Interactive Patient Education 2016 Elsevier Inc.  Bacterial Vaginosis Bacterial vaginosis is a vaginal infection that occurs when the normal balance of bacteria in the vagina is disrupted. It results from an overgrowth of certain bacteria. This is the most common vaginal infection in women of childbearing age. Treatment is important to prevent complications, especially in pregnant women, as it can cause a premature delivery. CAUSES  Bacterial vaginosis is caused by an increase in harmful bacteria that are normally present in smaller amounts in the vagina. Several different kinds of bacteria can cause bacterial vaginosis. However, the reason that the condition develops is not fully understood. RISK FACTORS Certain activities or behaviors can put you at an increased risk of developing bacterial vaginosis, including:  Having a new sex partner or multiple sex partners.  Douching.  Using an intrauterine device (IUD) for contraception. Women do not get bacterial vaginosis from toilet seats, bedding, swimming pools, or contact with objects around them. SIGNS AND SYMPTOMS  Some women with bacterial vaginosis have no signs or symptoms. Common symptoms include:  Grey vaginal discharge.  A fishlike odor with discharge, especially after sexual intercourse.  Itching or burning of the vagina and vulva.  Burning or pain with urination. DIAGNOSIS  Your health care provider will take a medical history and examine the vagina for signs of bacterial vaginosis. A sample of vaginal fluid may  be taken. Your health care provider will look at this sample under a microscope to check for bacteria and abnormal cells. A vaginal pH test may also be done.  TREATMENT  Bacterial vaginosis may be treated with antibiotic medicines. These may be given in the form of a  pill or a vaginal cream. A second round of antibiotics may be prescribed if the condition comes back after treatment. Because bacterial vaginosis increases your risk for sexually transmitted diseases, getting treated can help reduce your risk for chlamydia, gonorrhea, HIV, and herpes. HOME CARE INSTRUCTIONS   Only take over-the-counter or prescription medicines as directed by your health care provider.  If antibiotic medicine was prescribed, take it as directed. Make sure you finish it even if you start to feel better.  Tell all sexual partners that you have a vaginal infection. They should see their health care provider and be treated if they have problems, such as a mild rash or itching.  During treatment, it is important that you follow these instructions:  Avoid sexual activity or use condoms correctly.  Do not douche.  Avoid alcohol as directed by your health care provider.  Avoid breastfeeding as directed by your health care provider. SEEK MEDICAL CARE IF:   Your symptoms are not improving after 3 days of treatment.  You have increased discharge or pain.  You have a fever. MAKE SURE YOU:   Understand these instructions.  Will watch your condition.  Will get help right away if you are not doing well or get worse. FOR MORE INFORMATION  Centers for Disease Control and Prevention, Division of STD Prevention: SolutionApps.co.zawww.cdc.gov/std American Sexual Health Association (ASHA): www.ashastd.org    This information is not intended to replace advice given to you by your health care provider. Make sure you discuss any questions you have with your health care provider.   Document Released: 03/25/2005 Document Revised: 04/15/2014 Document Reviewed: 11/04/2012 Elsevier Interactive Patient Education 2016 Elsevier Inc.  Pelvic Pain, Female Female pelvic pain can be caused by many different things and start from a variety of places. Pelvic pain refers to pain that is located in the lower half  of the abdomen and between your hips. The pain may occur over a short period of time (acute) or may be reoccurring (chronic). The cause of pelvic pain may be related to disorders affecting the female reproductive organs (gynecologic), but it may also be related to the bladder, kidney stones, an intestinal complication, or muscle or skeletal problems. Getting help right away for pelvic pain is important, especially if there has been severe, sharp, or a sudden onset of unusual pain. It is also important to get help right away because some types of pelvic pain can be life threatening.  CAUSES  Below are only some of the causes of pelvic pain. The causes of pelvic pain can be in one of several categories.   Gynecologic.  Pelvic inflammatory disease.  Sexually transmitted infection.  Ovarian cyst or a twisted ovarian ligament (ovarian torsion).  Uterine lining that grows outside the uterus (endometriosis).  Fibroids, cysts, or tumors.  Ovulation.  Pregnancy.  Pregnancy that occurs outside the uterus (ectopic pregnancy).  Miscarriage.  Labor.  Abruption of the placenta or ruptured uterus.  Infection.  Uterine infection (endometritis).  Bladder infection.  Diverticulitis.  Miscarriage related to a uterine infection (septic abortion).  Bladder.  Inflammation of the bladder (cystitis).  Kidney stone(s).  Gastrointestinal.  Constipation.  Diverticulitis.  Neurologic.  Trauma.  Feeling pelvic pain because  of mental or emotional causes (psychosomatic).  Cancers of the bowel or pelvis. EVALUATION  Your caregiver will want to take a careful history of your concerns. This includes recent changes in your health, a careful gynecologic history of your periods (menses), and a sexual history. Obtaining your family history and medical history is also important. Your caregiver may suggest a pelvic exam. A pelvic exam will help identify the location and severity of the pain. It  also helps in the evaluation of which organ system may be involved. In order to identify the cause of the pelvic pain and be properly treated, your caregiver may order tests. These tests may include:   A pregnancy test.  Pelvic ultrasonography.  An X-ray exam of the abdomen.  A urinalysis or evaluation of vaginal discharge.  Blood tests. HOME CARE INSTRUCTIONS   Only take over-the-counter or prescription medicines for pain, discomfort, or fever as directed by your caregiver.   Rest as directed by your caregiver.   Eat a balanced diet.   Drink enough fluids to make your urine clear or pale yellow, or as directed.   Avoid sexual intercourse if it causes pain.   Apply warm or cold compresses to the lower abdomen depending on which one helps the pain.   Avoid stressful situations.   Keep a journal of your pelvic pain. Write down when it started, where the pain is located, and if there are things that seem to be associated with the pain, such as food or your menstrual cycle.  Follow up with your caregiver as directed.  SEEK MEDICAL CARE IF:  Your medicine does not help your pain.  You have abnormal vaginal discharge. SEEK IMMEDIATE MEDICAL CARE IF:   You have heavy bleeding from the vagina.   Your pelvic pain increases.   You feel light-headed or faint.   You have chills.   You have pain with urination or blood in your urine.   You have uncontrolled diarrhea or vomiting.   You have a fever or persistent symptoms for more than 3 days.  You have a fever and your symptoms suddenly get worse.   You are being physically or sexually abused.   This information is not intended to replace advice given to you by your health care provider. Make sure you discuss any questions you have with your health care provider.   Document Released: 02/20/2004 Document Revised: 12/14/2014 Document Reviewed: 07/15/2011 Elsevier Interactive Patient Education [employer].

## 2015-09-17 NOTE — ED Notes (Signed)
Pt reports lower abd/pelvic region cramping pain that started around 2 pm. Pt reports she took pregnancy test at home and it was positive. Pt reports she noticed some blood in the toilet when she went to the bathroom to urinate. Pt also reports she had a c-section on 05/16/15.

## 2015-09-17 NOTE — ED Provider Notes (Signed)
[facility] Emergency Department Provider Note   ____________________________________________  Time seen: Approximately 5:37 AM  I have reviewed the triage vital signs and the nursing notes.   HISTORY  Chief Complaint Pelvic Pain    HPI Natasha Davidson is a 25 y.o. female who comes into the hospital today with abdominal pain. The patient reports that yesterday she took several pregnancy tests and they all came back positive.The patient reports that her last menstrual period was on May 8. She reports that she had some crampy lower abdominal pain that developed today and some blood in the toilet when she was using the bathroom. The patient rates her pain 8 out of 10 in intensity. She has not taken anything for pain at home. She reports that she's had no nausea, no vomiting, no diarrhea, no pain with urination, no vaginal discharge. The patient is here for evaluation.   Past Medical History  Diagnosis Date  . Hypertension     pih    Patient Active Problem List   Diagnosis Date Noted  . Postpartum care following cesarean delivery 05/19/2015  . Supervision of high risk pregnancy in third trimester 05/16/2015  . Obesity affecting pregnancy in third trimester, antepartum 05/16/2015  . BMI 40.0-44.9, adult (HCC) 05/16/2015  . History of cesarean section complicating pregnancy 05/16/2015  . Status post cesarean section 05/16/2015    Past Surgical History  Procedure Laterality Date  . Cesarean section  2015  . Cesarean section N/A 05/16/2015    Procedure: CESAREAN SECTION;  Surgeon: Conard NovakStephen D Jackson, MD;  Location: [facility] ORS;  Service: Obstetrics;  Laterality: N/A;    Current Outpatient Rx  Name  Route  Sig  Dispense  Refill  . naproxen (NAPROSYN) 500 MG tablet   Oral   Take 1 tablet (500 mg total) by mouth 2 (two) times daily with a meal.   30 tablet   0   . oxyCODONE-acetaminophen (PERCOCET) 5-325 MG tablet   Oral   Take 1 tablet by mouth every 4  (four) hours as needed for severe pain.   20 tablet   0   . metroNIDAZOLE (FLAGYL) 500 MG tablet   Oral   Take 1 tablet (500 mg total) by mouth 2 (two) times daily.   14 tablet   0     Allergies Review of patient's allergies indicates no known allergies.  No family history on file.  Social History Social History  Substance Use Topics  . Smoking status: Never Smoker   . Smokeless tobacco: Never Used  . Alcohol Use: No    Review of Systems Constitutional: No fever/chills Eyes: No visual changes. ENT: No sore throat. Cardiovascular: Denies chest pain. Respiratory: Denies shortness of breath. Gastrointestinal: abdominal pain.  No nausea, no vomiting.  No diarrhea.  No constipation. Genitourinary: Vaginal bleeding Musculoskeletal: Negative for back pain. Skin: Negative for rash. Neurological: Negative for headaches, focal weakness or numbness.  10-point ROS otherwise negative.  ____________________________________________   PHYSICAL EXAM:  VITAL SIGNS: ED Triage Vitals  Enc Vitals Group     BP 09/17/15 0055 144/93 mmHg     Pulse Rate 09/17/15 0055 90     Resp 09/17/15 0055 18     Temp 09/17/15 0055 98.4 F (36.9 C)     Temp Source 09/17/15 0055 Oral     SpO2 09/17/15 0055 98 %     Weight 09/17/15 0055 232 lb (105.235 kg)     Height 09/17/15 0055 5\' 4"  (1.626 m)  Head Cir --      Peak Flow --      Pain Score 09/17/15 0056 10     Pain Loc --      Pain Edu? --      Excl. in GC? --     Constitutional: Alert and oriented. Well appearing and in mild distress. Eyes: Conjunctivae are normal. PERRL. EOMI. Head: Atraumatic. Nose: No congestion/rhinnorhea. Mouth/Throat: Mucous membranes are moist.  Oropharynx non-erythematous. Cardiovascular: Normal rate, regular rhythm. Grossly normal heart sounds.  Good peripheral circulation. Respiratory: Normal respiratory effort.  No retractions. Lungs CTAB. Genitourinary: Normal external genitalia no blood in the  vaginal vault, minimal discharge, no cervical motion tenderness, mild uterine and right adnexal tenderness to palpation with some mild fullness. Gastrointestinal: Soft with mild lower abd tenderness to palpation. Positive bowel sounds  Musculoskeletal: No lower extremity tenderness nor edema.   Neurologic:  Normal speech and language.  Skin:  Skin is warm, dry and intact.  Psychiatric: Mood and affect are normal.   ____________________________________________   LABS (all labs ordered are listed, but only abnormal results are displayed)  Labs Reviewed  WET PREP, GENITAL - Abnormal; Notable for the following:    Clue Cells Wet Prep HPF POC PRESENT (*)    WBC, Wet Prep HPF POC MODERATE (*)    All other components within normal limits  HCG, QUANTITATIVE, PREGNANCY - Abnormal; Notable for the following:    hCG, Beta Chain, Quant, S 60 (*)    All other components within normal limits  CBC - Abnormal; Notable for the following:    RDW 15.0 (*)    All other components within normal limits  CHLAMYDIA/NGC RT PCR ([facility] ONLY)  POC URINE PREG, ED  POCT PREGNANCY, URINE  POCT PREGNANCY, URINE   ____________________________________________  EKG  none ____________________________________________  RADIOLOGY  Pelvic ultrasound: There is a small amount of free fluid in the endometrial canal which is nonspecific, dominant follicles in both ovaries, given this simple parents no follow-up necessary. ____________________________________________   PROCEDURES  Procedure(s) performed: None  Critical Care performed: No  ____________________________________________   INITIAL IMPRESSION / ASSESSMENT AND PLAN / ED COURSE  Pertinent labs & imaging results that were available during my care of the patient were reviewed by me and considered in my medical decision making (see chart for details).  This is a 25 year old female who comes into the hospital today with some abdominal pain.  She reports that she took many pregnancy test that were positive and she is concerned that she may be pregnant and having pain in the pregnancy. The patient is here for evaluation. Although she reports that her pain is 8 out of 10 in intensity she sitting watching television and very comfortable in appearance.  The patient will receive a dose of Tylenol for pain and discomfort. She will be discharged to home. He should follow up with OB/GYN. ____________________________________________   FINAL CLINICAL IMPRESSION(S) / ED DIAGNOSES  Final diagnoses:  Abdominal pain  Pelvic pain in female  Bacterial vaginosis      NEW MEDICATIONS STARTED DURING THIS VISIT:  New Prescriptions   METRONIDAZOLE (FLAGYL) 500 MG TABLET    Take 1 tablet (500 mg total) by mouth 2 (two) times daily.     Note:  This document was prepared using Dragon voice recognition software and may include unintentional dictation errors.    Rebecka Apley, MD 09/17/15 [phone]

## 2015-12-20 ENCOUNTER — Emergency Department
Admission: EM | Admit: 2015-12-20 | Discharge: 2015-12-20 | Disposition: A | Discharge status: Home / Self Care | Payer: Medicaid Other | Attending: Emergency Medicine | Admitting: Emergency Medicine

## 2015-12-20 ENCOUNTER — Encounter: Payer: Self-pay | Admitting: Emergency Medicine

## 2015-12-20 DIAGNOSIS — R51 Headache: Secondary | ICD-10-CM | POA: Diagnosis not present

## 2015-12-20 DIAGNOSIS — I10 Essential (primary) hypertension: Secondary | ICD-10-CM | POA: Insufficient documentation

## 2015-12-20 DIAGNOSIS — Z5321 Procedure and treatment not carried out due to patient leaving prior to being seen by health care provider: Secondary | ICD-10-CM | POA: Diagnosis not present

## 2015-12-20 NOTE — ED Triage Notes (Addendum)
Patient ambulatory to triage with steady gait, without difficulty or distress noted;pt reports while at work began having generalized HA with photosensitivity; st BP 157/90 by EMS; st hx HTN but not rx any meds for such

## 2016-04-29 ENCOUNTER — Emergency Department
Admission: EM | Admit: 2016-04-29 | Discharge: 2016-04-29 | Disposition: A | Discharge status: Home / Self Care | Payer: Medicaid Other | Attending: Emergency Medicine | Admitting: Emergency Medicine

## 2016-04-29 DIAGNOSIS — O209 Hemorrhage in early pregnancy, unspecified: Secondary | ICD-10-CM | POA: Insufficient documentation

## 2016-04-29 DIAGNOSIS — Z79899 Other long term (current) drug therapy: Secondary | ICD-10-CM | POA: Insufficient documentation

## 2016-04-29 DIAGNOSIS — Z3A00 Weeks of gestation of pregnancy not specified: Secondary | ICD-10-CM | POA: Insufficient documentation

## 2016-04-29 DIAGNOSIS — I10 Essential (primary) hypertension: Secondary | ICD-10-CM | POA: Diagnosis not present

## 2016-04-29 DIAGNOSIS — Z5321 Procedure and treatment not carried out due to patient leaving prior to being seen by health care provider: Secondary | ICD-10-CM | POA: Diagnosis not present

## 2016-04-29 LAB — POCT PREGNANCY, URINE: PREG TEST UR: POSITIVE — AB

## 2016-04-29 NOTE — ED Triage Notes (Signed)
Pt took a pregnancy test a few weeks ago at home and she was positive - pt started spotting 2 days ago and having lower abd cramping - pt has not been to a doctor yet

## 2016-04-29 NOTE — ED Notes (Signed)
Pt called in waiting room, no answer

## 2016-04-29 NOTE — ED Notes (Signed)
Called in waiting room with no answer 

## 2016-05-18 ENCOUNTER — Emergency Department
Admission: EM | Admit: 2016-05-18 | Discharge: 2016-05-18 | Disposition: A | Discharge status: Home / Self Care | Payer: Medicaid Other | Attending: Emergency Medicine | Admitting: Emergency Medicine

## 2016-05-18 ENCOUNTER — Encounter: Payer: Self-pay | Admitting: Emergency Medicine

## 2016-05-18 DIAGNOSIS — O26891 Other specified pregnancy related conditions, first trimester: Secondary | ICD-10-CM | POA: Diagnosis present

## 2016-05-18 DIAGNOSIS — Z5321 Procedure and treatment not carried out due to patient leaving prior to being seen by health care provider: Secondary | ICD-10-CM | POA: Insufficient documentation

## 2016-05-18 DIAGNOSIS — I10 Essential (primary) hypertension: Secondary | ICD-10-CM | POA: Insufficient documentation

## 2016-05-18 DIAGNOSIS — Z791 Long term (current) use of non-steroidal anti-inflammatories (NSAID): Secondary | ICD-10-CM | POA: Diagnosis not present

## 2016-05-18 DIAGNOSIS — R1030 Lower abdominal pain, unspecified: Secondary | ICD-10-CM | POA: Diagnosis not present

## 2016-05-18 LAB — COMPREHENSIVE METABOLIC PANEL
ALT: 10 U/L — ABNORMAL LOW (ref 14–54)
ANION GAP: 6 (ref 5–15)
AST: 15 U/L (ref 15–41)
Albumin: 3.5 g/dL (ref 3.5–5.0)
Alkaline Phosphatase: 83 U/L (ref 38–126)
BILIRUBIN TOTAL: 0.5 mg/dL (ref 0.3–1.2)
BUN: 12 mg/dL (ref 6–20)
CALCIUM: 9 mg/dL (ref 8.9–10.3)
CO2: 25 mmol/L (ref 22–32)
Chloride: 105 mmol/L (ref 101–111)
Creatinine, Ser: 0.65 mg/dL (ref 0.44–1.00)
GFR calc Af Amer: >60 mL/min (ref >60)
Glucose, Bld: 109 mg/dL — ABNORMAL HIGH (ref 65–99)
POTASSIUM: 3.6 mmol/L (ref 3.5–5.1)
Sodium: 136 mmol/L (ref 135–145)
TOTAL PROTEIN: 7.1 g/dL (ref 6.5–8.1)

## 2016-05-18 LAB — URINALYSIS, COMPLETE (UACMP) WITH MICROSCOPIC
Bacteria, UA: NONE SEEN
Bilirubin Urine: NEGATIVE
Glucose, UA: NEGATIVE mg/dL
Hgb urine dipstick: NEGATIVE
KETONES UR: NEGATIVE mg/dL
Leukocytes, UA: NEGATIVE
Nitrite: NEGATIVE
PH: 6 (ref 5.0–8.0)
Protein, ur: NEGATIVE mg/dL
SPECIFIC GRAVITY, URINE: 1.026 (ref 1.005–1.030)

## 2016-05-18 LAB — CBC
HCT: 39.1 % (ref 35.0–47.0)
Hemoglobin: 13 g/dL (ref 12.0–16.0)
MCH: 27.3 pg (ref 26.0–34.0)
MCHC: 33.4 g/dL (ref 32.0–36.0)
MCV: 81.8 fL (ref 80.0–100.0)
PLATELETS: 269 10*3/uL (ref 150–440)
RBC: 4.78 MIL/uL (ref 3.80–5.20)
RDW: 14.2 % (ref 11.5–14.5)
WBC: 7.3 10*3/uL (ref 3.6–11.0)

## 2016-05-18 LAB — POCT PREGNANCY, URINE: Preg Test, Ur: POSITIVE — AB

## 2016-05-18 LAB — LIPASE, BLOOD: Lipase: 24 U/L (ref 11–51)

## 2016-05-18 NOTE — ED Notes (Signed)
Pt called to room without answer.  

## 2016-05-18 NOTE — ED Notes (Signed)
Pt called in waiting room x 3, no answer  

## 2016-05-18 NOTE — ED Notes (Signed)
Pt called for 3rd time without answer.

## 2016-05-18 NOTE — ED Triage Notes (Signed)
Pt to ed with c/o abd cramping and pain in lower abd.  Pt states she is approx [redacted] weeks pregnant.

## 2016-05-19 ENCOUNTER — Emergency Department: Payer: Medicaid Other

## 2016-05-19 ENCOUNTER — Emergency Department
Admission: EM | Admit: 2016-05-19 | Discharge: 2016-05-19 | Disposition: A | Discharge status: Home / Self Care | Payer: Medicaid Other | Attending: Emergency Medicine | Admitting: Emergency Medicine

## 2016-05-19 DIAGNOSIS — Z79899 Other long term (current) drug therapy: Secondary | ICD-10-CM | POA: Insufficient documentation

## 2016-05-19 DIAGNOSIS — Z3A09 9 weeks gestation of pregnancy: Secondary | ICD-10-CM | POA: Diagnosis not present

## 2016-05-19 DIAGNOSIS — O99611 Diseases of the digestive system complicating pregnancy, first trimester: Secondary | ICD-10-CM | POA: Diagnosis present

## 2016-05-19 DIAGNOSIS — R1030 Lower abdominal pain, unspecified: Secondary | ICD-10-CM | POA: Insufficient documentation

## 2016-05-19 DIAGNOSIS — R109 Unspecified abdominal pain: Secondary | ICD-10-CM

## 2016-05-19 DIAGNOSIS — O26891 Other specified pregnancy related conditions, first trimester: Secondary | ICD-10-CM | POA: Diagnosis not present

## 2016-05-19 DIAGNOSIS — R197 Diarrhea, unspecified: Secondary | ICD-10-CM | POA: Diagnosis not present

## 2016-05-19 DIAGNOSIS — O219 Vomiting of pregnancy, unspecified: Secondary | ICD-10-CM | POA: Diagnosis not present

## 2016-05-19 DIAGNOSIS — I10 Essential (primary) hypertension: Secondary | ICD-10-CM | POA: Diagnosis not present

## 2016-05-19 LAB — CBC
HCT: 41.6 % (ref 35.0–47.0)
Hemoglobin: 14 g/dL (ref 12.0–16.0)
MCH: 27.6 pg (ref 26.0–34.0)
MCHC: 33.7 g/dL (ref 32.0–36.0)
MCV: 82.1 fL (ref 80.0–100.0)
PLATELETS: 283 10*3/uL (ref 150–440)
RBC: 5.07 MIL/uL (ref 3.80–5.20)
RDW: 14.3 % (ref 11.5–14.5)
WBC: 7.9 10*3/uL (ref 3.6–11.0)

## 2016-05-19 LAB — URINALYSIS, ROUTINE W REFLEX MICROSCOPIC
BILIRUBIN URINE: NEGATIVE
Bacteria, UA: NONE SEEN
Glucose, UA: NEGATIVE mg/dL
Hgb urine dipstick: NEGATIVE
Ketones, ur: NEGATIVE mg/dL
Nitrite: NEGATIVE
PROTEIN: NEGATIVE mg/dL
SPECIFIC GRAVITY, URINE: 1.028 (ref 1.005–1.030)
pH: 6 (ref 5.0–8.0)

## 2016-05-19 LAB — BASIC METABOLIC PANEL
Anion gap: 6 (ref 5–15)
BUN: 11 mg/dL (ref 6–20)
CO2: 26 mmol/L (ref 22–32)
CREATININE: 0.7 mg/dL (ref 0.44–1.00)
Calcium: 9.1 mg/dL (ref 8.9–10.3)
Chloride: 106 mmol/L (ref 101–111)
GFR calc Af Amer: >60 mL/min (ref >60)
Glucose, Bld: 91 mg/dL (ref 65–99)
Potassium: 3.5 mmol/L (ref 3.5–5.1)
SODIUM: 138 mmol/L (ref 135–145)

## 2016-05-19 LAB — HCG, QUANTITATIVE, PREGNANCY: HCG, BETA CHAIN, QUANT, S: 137818 m[IU]/mL — AB (ref <5)

## 2016-05-19 NOTE — ED Provider Notes (Signed)
[facility] Emergency Department Provider Note  Time seen: 1:17 PM  I have reviewed the triage vital signs and the nursing notes.   HISTORY  Chief Complaint Abdominal Cramping    HPI Natasha Davidson is a 26 y.o. female approximately [redacted] weeks pregnant who presents to the emergency department with lower abdominal cramping. According to the patient for the past 3 days she has been experiencing lower abdominal cramping. She is approximately [redacted] weeks pregnant by LMP saw her OB approximate 2-3 weeks ago but has not seen them since. Patient states she has not yet had an ultrasound. Denies any discharge or bleeding. Denies dysuria. Patient states she has been somewhat nauseated with intermittent vomiting over the past several days has been expressing loose stool.  Past Medical History:  Diagnosis Date  . Hypertension    pih    Patient Active Problem List   Diagnosis Date Noted  . Postpartum care following cesarean delivery 05/19/2015  . Supervision of high risk pregnancy in third trimester 05/16/2015  . Obesity affecting pregnancy in third trimester, antepartum 05/16/2015  . BMI 40.0-44.9, adult (HCC) 05/16/2015  . History of cesarean section complicating pregnancy 05/16/2015  . Status post cesarean section 05/16/2015    Past Surgical History:  Procedure Laterality Date  . CESAREAN SECTION  2015  . CESAREAN SECTION N/A 05/16/2015   Procedure: CESAREAN SECTION;  Surgeon: Conard NovakStephen D Jackson, MD;  Location: [facility] ORS;  Service: Obstetrics;  Laterality: N/A;  . INDUCED ABORTION      Prior to Admission medications   Medication Sig Start Date End Date Taking? Authorizing Provider  naproxen (NAPROSYN) 500 MG tablet Take 1 tablet (500 mg total) by mouth 2 (two) times daily with a meal. 09/09/15   Tommi Rumpshonda L Summers, PA-C  oxyCODONE-acetaminophen (PERCOCET) 5-325 MG tablet Take 1 tablet by mouth every 4 (four) hours as needed for severe pain. 09/09/15   Tommi Rumpshonda L Summers, PA-C     No Known Allergies  No family history on file.  Social History Social History  Substance Use Topics  . Smoking status: Never Smoker  . Smokeless tobacco: Never Used  . Alcohol use No    Review of Systems Constitutional: Negative for fever. Cardiovascular: Negative for chest pain. Respiratory: Negative for shortness of breath. Gastrointestinal: Lower abdominal cramping. Positive for nausea and vomiting and diarrhea. Genitourinary: Negative for dysuria. Negative for vaginal bleeding or discharge. Neurological: Negative for headache 10-point ROS otherwise negative.  ____________________________________________   PHYSICAL EXAM:  VITAL SIGNS: ED Triage Vitals [05/19/16 1033]  Enc Vitals Group     BP 136/87     Pulse Rate 83     Resp 18     Temp 98.2 F (36.8 C)     Temp Source Oral     SpO2 100 %     Weight 242 lb (109.8 kg)     Height 5\' 4"  (1.626 m)     Head Circumference      Peak Flow      Pain Score 9     Pain Loc      Pain Edu?      Excl. in GC?     Constitutional: Alert and oriented. Well appearing and in no distress. Eyes: Normal exam ENT   Head: Normocephalic and atraumatic.   Mouth/Throat: Mucous membranes are moist. Cardiovascular: Normal rate, regular rhythm. No murmur Respiratory: Normal respiratory effort without tachypnea nor retractions. Breath sounds are clear Gastrointestinal: Soft, mild lower abdominal tenderness palpation. No rebound  or guarding. No distention. Musculoskeletal: Nontender with normal range of motion in all extremities.  Neurologic:  Normal speech and language. No gross focal neurologic deficits  Skin:  Skin is warm, dry and intact.  Psychiatric: Mood and affect are normal.  ____________________________________________   RADIOLOGY  Ultrasound shows 9 week 0 day IUP with a heart rate 168 bpm  ____________________________________________   INITIAL IMPRESSION / ASSESSMENT AND PLAN / ED COURSE  Pertinent labs  & imaging results that were available during my care of the patient were reviewed by me and considered in my medical decision making (see chart for details).  Patient presents the emergency department for 3 days of lower abdominal discomfort/cramping, some nausea and vomiting, and occasional loose stool. We will check labs, given the patient's lower abdominal tenderness we'll obtain an old shunt to further evaluate. Patient denies dysuria or vaginal bleeding or discharge.  Labs are largely within normal limits. Ultrasound shows 9 week 0 day IUP.  ____________________________________________   FINAL CLINICAL IMPRESSION(S) / ED DIAGNOSES  Abdominal cramping First trimester pregnancy   Minna Antis, MD 05/19/16 1517

## 2016-05-19 NOTE — Discharge Instructions (Signed)
Your ultrasound has shown normal results for a 9 week 0 day pregnancy. Her labs are normal. Please follow-up your OB as scheduled. Return to the emergency department for any abdominal pain, vaginal bleeding, or any other symptom personally concerning to yourself.

## 2016-05-19 NOTE — ED Notes (Signed)
ED Provider at bedside. 

## 2016-05-19 NOTE — ED Triage Notes (Addendum)
Pt reports lower abdominal cramping X 4 days. Pt in ER yesterday and LWBS. Denies vaginal bleeding. Pt alert and oriented X4, active, cooperative, pt in NAD. RR even and unlabored, color WNL.   Pt approx [redacted] weeks pregnant. Does not have appt with her OBGYN in near future.

## 2016-05-19 NOTE — ED Notes (Signed)
Lower abdominal cramping, no bleeding. Pt has only had 1 visit with OBGYN.

## 2016-05-19 NOTE — ED Notes (Signed)
Pt returned from ultrasound

## 2016-05-27 LAB — HM PAP SMEAR: HM PAP: NEGATIVE

## 2016-05-30 LAB — OB RESULTS CONSOLE GC/CHLAMYDIA
Chlamydia: NEGATIVE
GC PROBE AMP, GENITAL: NEGATIVE

## 2016-06-05 ENCOUNTER — Other Ambulatory Visit: Payer: Self-pay | Admitting: Advanced Practice Midwife

## 2016-06-05 ENCOUNTER — Telehealth: Payer: Self-pay

## 2016-06-05 ENCOUNTER — Encounter: Payer: Self-pay | Admitting: Advanced Practice Midwife

## 2016-06-05 DIAGNOSIS — O3680X0 Pregnancy with inconclusive fetal viability, not applicable or unspecified: Secondary | ICD-10-CM

## 2016-06-05 NOTE — Telephone Encounter (Signed)
Pt requesting rx for diclegis.

## 2016-06-06 MED ORDER — DOXYLAMINE-PYRIDOXINE 10-10 MG PO TBEC: 2.0000 | DELAYED_RELEASE_TABLET | Freq: Every day | ORAL | 5 refills | Status: DC

## 2016-06-06 NOTE — Telephone Encounter (Signed)
Please call pt to let her know rx has been sent.

## 2016-06-07 NOTE — Telephone Encounter (Signed)
Tried calling available number, not accepting calls.

## 2016-06-10 LAB — HM PAP SMEAR: Pap: NEGATIVE

## 2016-06-10 LAB — TRICH VAG BY NAA: Trich vag by NAA: NEGATIVE

## 2016-06-11 ENCOUNTER — Ambulatory Visit (INDEPENDENT_AMBULATORY_CARE_PROVIDER_SITE_OTHER): Payer: Self-pay | Admitting: Obstetrics and Gynecology

## 2016-06-11 ENCOUNTER — Other Ambulatory Visit: Payer: Self-pay

## 2016-06-11 DIAGNOSIS — Z5329 Procedure and treatment not carried out because of patient's decision for other reasons: Secondary | ICD-10-CM

## 2016-06-15 ENCOUNTER — Encounter: Payer: Self-pay | Admitting: Emergency Medicine

## 2016-06-15 ENCOUNTER — Emergency Department
Admission: EM | Admit: 2016-06-15 | Discharge: 2016-06-15 | Disposition: A | Discharge status: Home / Self Care | Payer: Medicaid Other | Attending: Student in an Organized Health Care Education/Training Program | Admitting: Student in an Organized Health Care Education/Training Program

## 2016-06-15 DIAGNOSIS — R102 Pelvic and perineal pain: Secondary | ICD-10-CM | POA: Insufficient documentation

## 2016-06-15 DIAGNOSIS — Z3A13 13 weeks gestation of pregnancy: Secondary | ICD-10-CM

## 2016-06-15 DIAGNOSIS — R112 Nausea with vomiting, unspecified: Secondary | ICD-10-CM

## 2016-06-15 DIAGNOSIS — Z791 Long term (current) use of non-steroidal anti-inflammatories (NSAID): Secondary | ICD-10-CM | POA: Insufficient documentation

## 2016-06-15 DIAGNOSIS — O219 Vomiting of pregnancy, unspecified: Secondary | ICD-10-CM | POA: Diagnosis present

## 2016-06-15 DIAGNOSIS — I10 Essential (primary) hypertension: Secondary | ICD-10-CM | POA: Insufficient documentation

## 2016-06-15 LAB — COMPREHENSIVE METABOLIC PANEL
ALT: 13 U/L — ABNORMAL LOW (ref 14–54)
ANION GAP: 8 (ref 5–15)
AST: 17 U/L (ref 15–41)
Albumin: 3.5 g/dL (ref 3.5–5.0)
Alkaline Phosphatase: 83 U/L (ref 38–126)
BUN: 9 mg/dL (ref 6–20)
CALCIUM: 8.5 mg/dL — AB (ref 8.9–10.3)
CHLORIDE: 103 mmol/L (ref 101–111)
CO2: 25 mmol/L (ref 22–32)
Creatinine, Ser: 0.43 mg/dL — ABNORMAL LOW (ref 0.44–1.00)
GFR calc Af Amer: >60 mL/min (ref >60)
GFR calc non Af Amer: >60 mL/min (ref >60)
Glucose, Bld: 99 mg/dL (ref 65–99)
Potassium: 3 mmol/L — ABNORMAL LOW (ref 3.5–5.1)
SODIUM: 136 mmol/L (ref 135–145)
Total Bilirubin: 0.6 mg/dL (ref 0.3–1.2)
Total Protein: 7.3 g/dL (ref 6.5–8.1)

## 2016-06-15 LAB — CBC
HCT: 39.7 % (ref 35.0–47.0)
HEMOGLOBIN: 13.5 g/dL (ref 12.0–16.0)
MCH: 27.9 pg (ref 26.0–34.0)
MCHC: 34 g/dL (ref 32.0–36.0)
MCV: 82.2 fL (ref 80.0–100.0)
Platelets: 246 10*3/uL (ref 150–440)
RBC: 4.83 MIL/uL (ref 3.80–5.20)
RDW: 13.8 % (ref 11.5–14.5)
WBC: 4.8 10*3/uL (ref 3.6–11.0)

## 2016-06-15 LAB — URINALYSIS, COMPLETE (UACMP) WITH MICROSCOPIC
BILIRUBIN URINE: NEGATIVE
GLUCOSE, UA: NEGATIVE mg/dL
Hgb urine dipstick: NEGATIVE
KETONES UR: NEGATIVE mg/dL
LEUKOCYTES UA: NEGATIVE
Nitrite: NEGATIVE
PROTEIN: NEGATIVE mg/dL
Specific Gravity, Urine: 1.029 (ref 1.005–1.030)
WBC UA: NONE SEEN WBC/hpf (ref 0–5)
pH: 5 (ref 5.0–8.0)

## 2016-06-15 LAB — INFLUENZA PANEL BY PCR (TYPE A & B)
Influenza A By PCR: NEGATIVE
Influenza B By PCR: NEGATIVE

## 2016-06-15 LAB — HCG, QUANTITATIVE, PREGNANCY: HCG, BETA CHAIN, QUANT, S: 67696 m[IU]/mL — AB (ref <5)

## 2016-06-15 LAB — LIPASE, BLOOD

## 2016-06-15 LAB — ABO/RH: ABO/RH(D): A POS

## 2016-06-15 MED ORDER — ACETAMINOPHEN 500 MG PO TABS
1000.0000 mg | ORAL_TABLET | Freq: Once | ORAL | Status: AC
  Administered 2016-06-15: 1000 mg via ORAL
  Filled 2016-06-15: qty 2

## 2016-06-15 MED ORDER — KCL IN DEXTROSE-NACL 20-5-0.45 MEQ/L-%-% IV SOLN
Freq: Once | INTRAVENOUS | Status: AC
  Administered 2016-06-15: 20:00:00 via INTRAVENOUS
  Filled 2016-06-15: qty 1000

## 2016-06-15 MED ORDER — ONDANSETRON 4 MG PO TBDP
4.0000 mg | ORAL_TABLET | Freq: Once | ORAL | Status: AC
  Administered 2016-06-15: 4 mg via ORAL
  Filled 2016-06-15: qty 1

## 2016-06-15 MED ORDER — ONDANSETRON HCL 4 MG PO TABS: 4.0000 mg | ORAL_TABLET | Freq: Every day | ORAL | 0 refills | Status: DC | PRN

## 2016-06-15 MED ORDER — SODIUM CHLORIDE 0.9 % IV BOLUS (SEPSIS)
1000.0000 mL | Freq: Once | INTRAVENOUS | Status: AC
  Administered 2016-06-15: 1000 mL via INTRAVENOUS

## 2016-06-15 MED ORDER — METOCLOPRAMIDE HCL 5 MG/ML IJ SOLN
10.0000 mg | Freq: Once | INTRAMUSCULAR | Status: AC
  Administered 2016-06-15: 10 mg via INTRAVENOUS
  Filled 2016-06-15: qty 2

## 2016-06-15 NOTE — ED Provider Notes (Signed)
[facility] Emergency Department Provider Note    First MD Initiated Contact with Patient 06/15/16 1926     (approximate)  I have reviewed the triage vital signs and the nursing notes.   HISTORY  Chief Complaint Abdominal Pain and Emesis    HPI Natasha Davidson is a 26 y.o. female [phone] presents with abdominal cramping nausea and vomiting after the past several days. Currently [redacted] weeks pregnant. Had reassuring intrauterine pregnancy verified by ultrasound 2 weeks ago. Denies any vaginal bleeding. Denies any pressure. No dysuria. No fevers. States that symptoms worsen the morning. Symptoms similar to previous visit 2 weeks ago.    Past Medical History:  Diagnosis Date  . Anxiety   . Depression   . Hypertension    pih  . Obesity affecting pregnancy    Family History  Problem Relation Age of Onset  . Hypertension Father    Past Surgical History:  Procedure Laterality Date  . CESAREAN SECTION  2015  . CESAREAN SECTION N/A 05/16/2015   Procedure: CESAREAN SECTION;  Surgeon: Conard Novak, MD;  Location: [facility] ORS;  Service: Obstetrics;  Laterality: N/A;  . INDUCED ABORTION    . TONSILLECTOMY     Patient Active Problem List   Diagnosis Date Noted  . Postpartum care following cesarean delivery 05/19/2015  . Supervision of high risk pregnancy in third trimester 05/16/2015  . Obesity affecting pregnancy in third trimester, antepartum 05/16/2015  . BMI 40.0-44.9, adult (HCC) 05/16/2015  . History of cesarean section complicating pregnancy 05/16/2015  . Status post cesarean section 05/16/2015      Prior to Admission medications   Medication Sig Start Date End Date Taking? Authorizing Provider  Doxylamine-Pyridoxine (DICLEGIS) 10-10 MG TBEC Take 2 tablets by mouth at bedtime. If symptoms persist, add one tablet in the morning and one in the afternoon 06/06/16   Tresea Mall, CNM  naproxen (NAPROSYN) 500 MG tablet Take 1 tablet (500 mg total) by  mouth 2 (two) times daily with a meal. 09/09/15   Tommi Rumps, PA-C  oxyCODONE-acetaminophen (PERCOCET) 5-325 MG tablet Take 1 tablet by mouth every 4 (four) hours as needed for severe pain. 09/09/15   Tommi Rumps, PA-C    Allergies Patient has no known allergies.    Social History Social History  Substance Use Topics  . Smoking status: Never Smoker  . Smokeless tobacco: Never Used  . Alcohol use No    Review of Systems Patient denies headaches, rhinorrhea, blurry vision, numbness, shortness of breath, chest pain, edema, cough, abdominal pain, nausea, vomiting, diarrhea, dysuria, fevers, rashes or hallucinations unless otherwise stated above in HPI. ____________________________________________   PHYSICAL EXAM:  VITAL SIGNS: Vitals:   06/15/16 1633 06/15/16 1936  BP: (!) 140/91 125/86  Pulse: (!) 123 (!) 101  Resp: 18 18  Temp: 99.3 F (37.4 C)     Constitutional: Alert and oriented. Well appearing and in no acute distress. Eyes: Conjunctivae are normal. PERRL. EOMI. Head: Atraumatic. Nose: No congestion/rhinnorhea. Mouth/Throat: Mucous membranes are moist.  Oropharynx non-erythematous. Neck: No stridor. Painless ROM. No cervical spine tenderness to palpation Hematological/Lymphatic/Immunilogical: No cervical lymphadenopathy. Cardiovascular: Normal rate, regular rhythm. Grossly normal heart sounds.  Good peripheral circulation. Respiratory: Normal respiratory effort.  No retractions. Lungs CTAB. Gastrointestinal: Soft and nontender, gravid, no suprapubic ttp.. No distention. No abdominal bruits. No CVA tenderness. Musculoskeletal: No lower extremity tenderness nor edema.  No joint effusions. Neurologic:  Normal speech and language. No gross focal neurologic deficits are  appreciated. No gait instability. Skin:  Skin is warm, dry and intact. No rash noted. Psychiatric: Mood and affect are normal. Speech and behavior are  normal.  ____________________________________________   LABS (all labs ordered are listed, but only abnormal results are displayed)  Results for orders placed or performed during the hospital encounter of 06/15/16 (from the past 24 hour(s))  hCG, quantitative, pregnancy     Status: Abnormal   Collection Time: 06/15/16  4:32 PM  Result Value Ref Range   hCG, Beta Chain, Sharene ButtersQuant, S 16,10967,696 (H) <5 mIU/mL  ABO/Rh     Status: None   Collection Time: 06/15/16  4:32 PM  Result Value Ref Range   ABO/RH(D) A POS   Lipase, blood     Status: Abnormal   Collection Time: 06/15/16  4:32 PM  Result Value Ref Range   Lipase <10 (L) 11 - 51 U/L  Comprehensive metabolic panel     Status: Abnormal   Collection Time: 06/15/16  4:32 PM  Result Value Ref Range   Sodium 136 135 - 145 mmol/L   Potassium 3.0 (L) 3.5 - 5.1 mmol/L   Chloride 103 101 - 111 mmol/L   CO2 25 22 - 32 mmol/L   Glucose, Bld 99 65 - 99 mg/dL   BUN 9 6 - 20 mg/dL   Creatinine, Ser 6.040.43 (L) 0.44 - 1.00 mg/dL   Calcium 8.5 (L) 8.9 - 10.3 mg/dL   Total Protein 7.3 6.5 - 8.1 g/dL   Albumin 3.5 3.5 - 5.0 g/dL   AST 17 15 - 41 U/L   ALT 13 (L) 14 - 54 U/L   Alkaline Phosphatase 83 38 - 126 U/L   Total Bilirubin 0.6 0.3 - 1.2 mg/dL   GFR calc non Af Amer >60 >60 mL/min   GFR calc Af Amer >60 >60 mL/min   Anion gap 8 5 - 15  CBC     Status: None   Collection Time: 06/15/16  4:32 PM  Result Value Ref Range   WBC 4.8 3.6 - 11.0 K/uL   RBC 4.83 3.80 - 5.20 MIL/uL   Hemoglobin 13.5 12.0 - 16.0 g/dL   HCT 54.039.7 98.135.0 - 19.147.0 %   MCV 82.2 80.0 - 100.0 fL   MCH 27.9 26.0 - 34.0 pg   MCHC 34.0 32.0 - 36.0 g/dL   RDW 47.813.8 29.511.5 - 62.114.5 %   Platelets 246 150 - 440 K/uL  Urinalysis, Complete w Microscopic     Status: Abnormal   Collection Time: 06/15/16  4:32 PM  Result Value Ref Range   Color, Urine YELLOW (A) YELLOW   APPearance CLOUDY (A) CLEAR   Specific Gravity, Urine 1.029 1.005 - 1.030   pH 5.0 5.0 - 8.0   Glucose, UA NEGATIVE  NEGATIVE mg/dL   Hgb urine dipstick NEGATIVE NEGATIVE   Bilirubin Urine NEGATIVE NEGATIVE   Ketones, ur NEGATIVE NEGATIVE mg/dL   Protein, ur NEGATIVE NEGATIVE mg/dL   Nitrite NEGATIVE NEGATIVE   Leukocytes, UA NEGATIVE NEGATIVE   RBC / HPF 0-5 0 - 5 RBC/hpf   WBC, UA NONE SEEN 0 - 5 WBC/hpf   Bacteria, UA RARE (A) NONE SEEN   Squamous Epithelial / LPF 6-30 (A) NONE SEEN   Mucous PRESENT    ____________________________________________  EKG____________________________________________  RADIOLOGY   ____________________________________________   PROCEDURES  Procedure(s) performed:  Procedures    Critical Care performed: no ____________________________________________   INITIAL IMPRESSION / ASSESSMENT AND PLAN / ED COURSE  Pertinent labs &  imaging results that were available during my care of the patient were reviewed by me and considered in my medical decision making (see chart for details).  DDX: hyperemesis, dehydration, enteritis, flu like illness, uti  Natasha Davidson is a 26 y.o. who presents to the ED with abdominal cramping. Patient afebrile. She is mildly tachycardic but well perfused. Her abdominal exam is soft and benign. No documented intrauterine pregnancy. Urinalysis shows no evidence of infection. Blood work is otherwise reassuring. Heart rate improved with IV fluids and patient was able to tolerate oral hydration. Did sent for flu which was negative. Do not feel further diagnostic imaging clinically indicated at this time. Patient is stable for close outpatient follow-up with her OB/GYN. We'll provided Zofran for symptomatically management.  Patient was able to tolerate PO and was able to ambulate with a steady gait.  Have discussed with the patient and available family all diagnostics and treatments performed thus far and all questions were answered to the best of my ability. The patient demonstrates understanding and agreement with plan.        ____________________________________________   FINAL CLINICAL IMPRESSION(S) / ED DIAGNOSES  Final diagnoses:  Non-intractable vomiting with nausea, unspecified vomiting type  [redacted] weeks gestation of pregnancy      NEW MEDICATIONS STARTED DURING THIS VISIT:  New Prescriptions   No medications on file     Note:  This document was prepared using Dragon voice recognition software and may include unintentional dictation errors.    Willy Eddy, MD 06/16/16 [phone]

## 2016-06-15 NOTE — ED Triage Notes (Signed)
Patient to ER for c/o abdominal cramping with N/V since yesterday. Patient reports she is currently [redacted] weeks pregnant (due date 12/21/16). Patient denies any vaginal bleeding.

## 2016-07-03 ENCOUNTER — Encounter: Payer: Self-pay | Admitting: Advanced Practice Midwife

## 2016-07-03 ENCOUNTER — Other Ambulatory Visit: Payer: Self-pay

## 2016-07-22 ENCOUNTER — Encounter: Payer: Self-pay | Admitting: Advanced Practice Midwife

## 2016-07-22 ENCOUNTER — Other Ambulatory Visit: Payer: Self-pay

## 2016-07-30 ENCOUNTER — Other Ambulatory Visit: Payer: Self-pay | Admitting: Advanced Practice Midwife

## 2016-07-30 ENCOUNTER — Ambulatory Visit (INDEPENDENT_AMBULATORY_CARE_PROVIDER_SITE_OTHER): Payer: Medicaid Other | Admitting: Obstetrics and Gynecology

## 2016-07-30 ENCOUNTER — Ambulatory Visit (INDEPENDENT_AMBULATORY_CARE_PROVIDER_SITE_OTHER): Payer: Medicaid Other

## 2016-07-30 VITALS — BP 120/68 | Wt 260.0 lb

## 2016-07-30 DIAGNOSIS — O3680X0 Pregnancy with inconclusive fetal viability, not applicable or unspecified: Secondary | ICD-10-CM

## 2016-07-30 DIAGNOSIS — Z3A19 19 weeks gestation of pregnancy: Secondary | ICD-10-CM

## 2016-07-30 DIAGNOSIS — O99210 Obesity complicating pregnancy, unspecified trimester: Secondary | ICD-10-CM

## 2016-07-30 DIAGNOSIS — Z3687 Encounter for antenatal screening for uncertain dates: Secondary | ICD-10-CM

## 2016-07-30 DIAGNOSIS — IMO0002 Reserved for concepts with insufficient information to code with codable children: Secondary | ICD-10-CM

## 2016-07-30 DIAGNOSIS — O34219 Maternal care for unspecified type scar from previous cesarean delivery: Secondary | ICD-10-CM

## 2016-07-30 DIAGNOSIS — Z048 Encounter for examination and observation for other specified reasons: Secondary | ICD-10-CM

## 2016-07-30 DIAGNOSIS — Z0489 Encounter for examination and observation for other specified reasons: Secondary | ICD-10-CM

## 2016-07-30 DIAGNOSIS — O0990 Supervision of high risk pregnancy, unspecified, unspecified trimester: Secondary | ICD-10-CM

## 2016-07-30 NOTE — Patient Instructions (Signed)

## 2016-07-30 NOTE — Progress Notes (Signed)
Anatomy Scan/It is a girl

## 2016-07-30 NOTE — Progress Notes (Signed)
    Routine Prenatal Care Visit  Subjective  Fetal Movement? yes Contractions? no Leaking Fluid? no Vaginal Bleeding? no  Objective   Vitals:   07/30/16 0941  BP: 120/68    General: NAD Pumonary: no increased work of breathing Abdomen: gravid, non-tender, +FHT Extremities: no edema Psychiatric: mood appropriate, affect full   Assessment   26 y.o. [id number] at [redacted]w[redacted]d by  12/19/2016, by Last Menstrual Period presenting for routine prenatal visit  Pregnancy #5 Problems (from 03/14/16 to present)    Problem Noted Resolved   Supervision of high-risk pregnancy 07/30/2016 by Vena Austria, MD No   Overview Addendum 08/02/2016  5:05 PM by Vena Austria, MD    Clinic [facility] Prenatal Labs  Dating LMP=19wk [place] Blood type: A/Positive/-- (04/24 1044)   Quad  negative Antibody:Negative (04/24 1044)  Anatomic [place] Incomplete  Rubella: 1.14 (04/24 1044) Varicella: I  GTT Early:               Third trimester:  RPR: Non Reactive (04/24 1044)   Rhogam  HBsAg: Negative (04/24 1044)   TDaP vaccine                       Flu Shot: HIV: Non Reactive (04/24 1044)   Baby Food                                GBS:   Contraception  Pap: 05/27/16 NIL  CBB     CS/VBAC    Support Person                   Plan   Problem List Items Addressed This Visit      Other   History of cesarean section complicating pregnancy   Relevant Orders   RPR+Rh+ABO+Rub Ab+Ab Scr+CB... (Completed)   Urine culture (Completed)   Hemoglobinopathy evaluation (Completed)   Urine Drug Panel 7 (Completed)   AFP, Quad Screen (Completed)   Hemoglobin A1c (Completed)   Supervision of high-risk pregnancy   Relevant Orders   RPR+Rh+ABO+Rub Ab+Ab Scr+CB... (Completed)   Urine culture (Completed)   Hemoglobinopathy evaluation (Completed)   Urine Drug Panel 7 (Completed)   AFP, Quad Screen (Completed)   Hemoglobin A1c (Completed)    Other Visit Diagnoses    [redacted] weeks gestation of pregnancy    -  Primary   Relevant  Orders   RPR+Rh+ABO+Rub Ab+Ab Scr+CB... (Completed)   Urine culture (Completed)   Hemoglobinopathy evaluation (Completed)   Urine Drug Panel 7 (Completed)   AFP, Quad Screen (Completed)   Hemoglobin A1c (Completed)   Obesity affecting pregnancy, antepartum       Relevant Orders   AFP, Quad Screen (Completed)   Hemoglobin A1c (Completed)   Evaluate anatomy not seen on prior sonogram       Relevant Orders   US OB Comp + 14 Wk     Anatomy screen incomplete follow up ordered, she does not have any prior NOB labs drawn so these were drawn at today's visit

## 2016-07-31 LAB — URINE DRUG PANEL 7
AMPHETAMINES, URINE: NEGATIVE ng/mL
BARBITURATE QUANT UR: NEGATIVE ng/mL
Benzodiazepine Quant, Ur: NEGATIVE ng/mL
CANNABINOID QUANT UR: NEGATIVE ng/mL
COCAINE (METAB.): NEGATIVE ng/mL
Opiate Quant, Ur: NEGATIVE ng/mL
PCP Quant, Ur: NEGATIVE ng/mL

## 2016-08-01 LAB — URINE CULTURE

## 2016-08-02 ENCOUNTER — Encounter: Payer: Self-pay | Admitting: Obstetrics and Gynecology

## 2016-08-02 ENCOUNTER — Other Ambulatory Visit: Payer: Self-pay | Admitting: Obstetrics and Gynecology

## 2016-08-02 DIAGNOSIS — Z3A20 20 weeks gestation of pregnancy: Secondary | ICD-10-CM

## 2016-08-02 DIAGNOSIS — R7303 Prediabetes: Secondary | ICD-10-CM

## 2016-08-02 LAB — RPR+RH+ABO+RUB AB+AB SCR+CB...
ANTIBODY SCREEN: NEGATIVE
HIV Screen 4th Generation wRfx: NONREACTIVE
Hematocrit: 38 % (ref 34.0–46.6)
Hemoglobin: 12.6 g/dL (ref 11.1–15.9)
Hepatitis B Surface Ag: NEGATIVE
MCH: 27 pg (ref 26.6–33.0)
MCHC: 33.2 g/dL (ref 31.5–35.7)
MCV: 81 fL (ref 79–97)
PLATELETS: 297 10*3/uL (ref 150–379)
RBC: 4.67 x10E6/uL (ref 3.77–5.28)
RDW: 14.5 % (ref 12.3–15.4)
RPR Ser Ql: NONREACTIVE
Rh Factor: POSITIVE
Rubella Antibodies, IGG: 1.14 index (ref >0.99)
VARICELLA: 1189 {index} (ref >165)
WBC: 6.9 10*3/uL (ref 3.4–10.8)

## 2016-08-02 LAB — HEMOGLOBINOPATHY EVALUATION
HEMOGLOBIN F QUANTITATION: 0 % (ref 0.0–2.0)
HGB C: 0 %
HGB S: 0 %
HGB VARIANT: 1.2 % — ABNORMAL HIGH
Hemoglobin A2 Quantitation: 1.3 % — ABNORMAL LOW (ref 1.8–3.2)
Hgb A: 97.5 % (ref 96.4–98.8)

## 2016-08-02 LAB — AFP, QUAD SCREEN
DIA Mom Value: 0.79
DIA Value (EIA): 109.83 pg/mL
DSR (By Age)    1 IN: 1022
DSR (Second Trimester) 1 IN: 5494
Gestational Age: 19.7 WEEKS
MSAFP MOM: 0.64
MSAFP: 27.2 ng/mL
MSHCG MOM: 1.31
MSHCG: 22274 m[IU]/mL
Maternal Age At EDD: 25.2 yr
Osb Risk: 10000
TEST RESULTS AFP: NEGATIVE
WEIGHT: 260 [lb_av]
uE3 Mom: 1.2
uE3 Value: 1.79 ng/mL

## 2016-08-02 LAB — HEMOGLOBIN A1C
ESTIMATED AVERAGE GLUCOSE: 117 mg/dL
HEMOGLOBIN A1C: 5.7 % — AB (ref 4.8–5.6)

## 2016-08-20 ENCOUNTER — Telehealth: Payer: Self-pay

## 2016-08-20 NOTE — Telephone Encounter (Signed)
Pt states she needs a note for work to excuse her for absences for upcoming appts during allotted time frames. 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 Left msg for pt that AMS out of the office today and we would call when note was ready. Pt cb# [phone] thank you

## 2016-08-20 NOTE — Telephone Encounter (Signed)
Please advise, let me know when note is ready

## 2016-08-21 ENCOUNTER — Telehealth: Payer: Self-pay

## 2016-08-21 ENCOUNTER — Encounter: Payer: Self-pay | Admitting: *Deleted

## 2016-08-21 ENCOUNTER — Encounter: Payer: Self-pay | Admitting: Obstetrics and Gynecology

## 2016-08-21 ENCOUNTER — Observation Stay
Admission: EM | Admit: 2016-08-21 | Discharge: 2016-08-21 | Disposition: A | Discharge status: Home / Self Care | Payer: Medicaid Other | Attending: Obstetrics and Gynecology | Admitting: Obstetrics and Gynecology

## 2016-08-21 DIAGNOSIS — O26892 Other specified pregnancy related conditions, second trimester: Principal | ICD-10-CM | POA: Insufficient documentation

## 2016-08-21 DIAGNOSIS — M7989 Other specified soft tissue disorders: Secondary | ICD-10-CM | POA: Diagnosis present

## 2016-08-21 DIAGNOSIS — Z3A22 22 weeks gestation of pregnancy: Secondary | ICD-10-CM

## 2016-08-21 DIAGNOSIS — O0992 Supervision of high risk pregnancy, unspecified, second trimester: Secondary | ICD-10-CM

## 2016-08-21 DIAGNOSIS — O1202 Gestational edema, second trimester: Secondary | ICD-10-CM | POA: Diagnosis not present

## 2016-08-21 DIAGNOSIS — O162 Unspecified maternal hypertension, second trimester: Secondary | ICD-10-CM | POA: Insufficient documentation

## 2016-08-21 LAB — COMPREHENSIVE METABOLIC PANEL
ALK PHOS: 79 U/L (ref 38–126)
ALT: 7 U/L — AB (ref 14–54)
AST: 12 U/L — AB (ref 15–41)
Albumin: 2.6 g/dL — ABNORMAL LOW (ref 3.5–5.0)
Anion gap: 6 (ref 5–15)
BUN: 8 mg/dL (ref 6–20)
CO2: 22 mmol/L (ref 22–32)
CREATININE: 0.55 mg/dL (ref 0.44–1.00)
Calcium: 8.7 mg/dL — ABNORMAL LOW (ref 8.9–10.3)
Chloride: 108 mmol/L (ref 101–111)
GFR calc Af Amer: >60 mL/min (ref >60)
Glucose, Bld: 89 mg/dL (ref 65–99)
Potassium: 3.6 mmol/L (ref 3.5–5.1)
Sodium: 136 mmol/L (ref 135–145)
Total Bilirubin: 0.2 mg/dL — ABNORMAL LOW (ref 0.3–1.2)
Total Protein: 6 g/dL — ABNORMAL LOW (ref 6.5–8.1)

## 2016-08-21 LAB — CBC
HEMATOCRIT: 34.4 % — AB (ref 35.0–47.0)
HEMOGLOBIN: 11.5 g/dL — AB (ref 12.0–16.0)
MCH: 27.1 pg (ref 26.0–34.0)
MCHC: 33.4 g/dL (ref 32.0–36.0)
MCV: 81.2 fL (ref 80.0–100.0)
Platelets: 244 10*3/uL (ref 150–440)
RBC: 4.24 MIL/uL (ref 3.80–5.20)
RDW: 14 % (ref 11.5–14.5)
WBC: 8.3 10*3/uL (ref 3.6–11.0)

## 2016-08-21 LAB — PROTEIN / CREATININE RATIO, URINE
CREATININE, URINE: 223 mg/dL
Protein Creatinine Ratio: 0.05 mg/mg{Cre} (ref 0.00–0.15)
Total Protein, Urine: 11 mg/dL

## 2016-08-21 NOTE — Telephone Encounter (Signed)
I called patient when I received the message from [place]. Pt aware the letter was sent to her through the patient portal earlier this morning.

## 2016-08-21 NOTE — Discharge Summary (Signed)
See final progress note. 

## 2016-08-21 NOTE — Telephone Encounter (Signed)
Pt aware letter can be seen through her My Chart.

## 2016-08-21 NOTE — Telephone Encounter (Signed)
Letter should be in her chart now under notes

## 2016-08-21 NOTE — Telephone Encounter (Signed)
Pt calling - needs work note ASAP to excuse all appts, allow for a lot of break times as she needs them.  Would like to have today before she goes in to work at [employer]11am  (My  Epic was down so I call KJ about this msg)

## 2016-08-21 NOTE — Progress Notes (Signed)
Discharge instructions given and explained.  Questions answered.  Verbalized understanding. Work note given for today.

## 2016-08-21 NOTE — OB Triage Note (Signed)
Recvd to OBS4 with c/o of headache, blurred vision and edema for 1 week.  Changed to gown and to bed. EFM applied.  Oriented to room and plan of care discussed.  Verbalized understanding and agrees with plan.  Denies leaking of fluid or bleeding.

## 2016-08-21 NOTE — Final Progress Note (Signed)
Physician Final Progress Note  Patient ID: Natasha Davidson MRN: [id number] DOB/AGE: 25/09/1990 25 y.o.  Admit date: 08/21/2016 Admitting provider: Conard NovakStephen D Amarius Toto, MD Discharge date: 08/21/2016   Admission Diagnoses:  1) intrauterine pregnancy at 415w6d  2) leg swelling, bilateral  Discharge Diagnoses:  1) intrauterine pregnancy at 6415w6d  2) leg swelling, bilateral  History of Present Illness: The patient is a 26 y.o. female [id number] at 5415w6d who presents for 1 week history of bilateral leg swelling.  The swelling seems to be unchanged regardless of leg positioning.  She has an occasional headache, made better by tylenol.  States she has occasional spots in her vision.  Denies trouble breathing and chest pain.  She does not take her BPs at home.  Her pregnancy has been complicated by morbid obesity, history of cesarean delivery, and what sounds like benign essential hypertension.  She notes normal fetal movement, no vaginal bleeding, and no leakage of fluid.   Hospital Course: patient admitted for observation.  Fetal doppler tones noted and in normal range.  Normal exam, apart from a small amount of pitting edema.  Lungs and heart sounds normal.  No evidence of CHF, DVT.  Baseline preeclampsia labs taken. Patient instructed to follow up within the week.  Recommended compression hose for her edema in the mean time.  Past Medical History:  Diagnosis Date  . Anxiety   . Depression   . Hypertension    pih  . Obesity affecting pregnancy     Past Surgical History:  Procedure Laterality Date  . CESAREAN SECTION  2015  . CESAREAN SECTION N/A 05/16/2015   Procedure: CESAREAN SECTION;  Surgeon: Conard NovakStephen D Anjolina Byrer, MD;  Location: [facility] ORS;  Service: Obstetrics;  Laterality: N/A;  . INDUCED ABORTION    . TONSILLECTOMY      No current facility-administered medications on file prior to encounter.    No current outpatient prescriptions on file prior to encounter.    No Known  Allergies  Social History   Social History  . Marital status: Single    Spouse name: N/A  . Number of children: N/A  . Years of education: N/A   Occupational History  . Not on file.   Social History Main Topics  . Smoking status: Never Smoker  . Smokeless tobacco: Never Used  . Alcohol use No  . Drug use: No  . Sexual activity: Yes   Other Topics Concern  . Not on file   Social History Narrative  . No narrative on file    Physical Exam: BP 129/68   Pulse 95   Temp 99 F (37.2 C) (Oral)   Resp 16   Ht 5\' 4"  (1.626 m)   Wt 260 lb (117.9 kg)   LMP 03/14/2016 (Exact Date)   BMI 44.63 kg/m   Physical Exam  Constitutional: She is oriented to person, place, and time. She appears well-developed and well-nourished. No distress.  Eyes: EOM are normal. No scleral icterus.  Neck: Normal range of motion. Neck supple. No tracheal deviation present.  Cardiovascular: Normal rate and regular rhythm.  Exam reveals no gallop and no friction rub.   No murmur heard. Lower extremities: no e/c/t  Pulmonary/Chest: Effort normal and breath sounds normal. No respiratory distress. She has no wheezes. She has no rales.  Abdominal:  Gravid, NT  Musculoskeletal: Normal range of motion. She exhibits edema (trace symmetric bilateral pitting edema). She exhibits no tenderness.  Lymphadenopathy:    She has no cervical adenopathy.  Neurological: She is alert and oriented to person, place, and time. No cranial nerve deficit.  Skin: Skin is warm. No erythema.  Psychiatric: She has a normal mood and affect. Her behavior is normal. Judgment normal.     Consults: None  Significant Findings/ Diagnostic Studies:  Pending CMP, CBC, urine protein/creatinie ratio  Procedures: fetal heart tones, normal range  Discharge Condition: stable  Disposition: 01-Home or Self Care  Diet: Regular diet  Discharge Activity: Activity as tolerated   Allergies as of 08/21/2016   No Known Allergies      Medication List    You have not been prescribed any medications.    Follow-up Information    [facility] Follow up in 1 week(s).   Why:  follow up swelling Contact information: [address] [place] [address] [phone]          Total time spent taking care of this patient: 25 minutes  Signed: Thomasene Mohair, MD  08/21/2016, 1:36 PM

## 2016-08-22 ENCOUNTER — Telehealth: Payer: Self-pay

## 2016-08-22 NOTE — Telephone Encounter (Signed)
Note faxed to number given by patient. KJ CMA

## 2016-08-22 NOTE — Telephone Encounter (Signed)
Pt called.  Wants work note faxed to her work at [phone] as she is unable to get into the portal/MyChart.  [phone]

## 2016-08-27 ENCOUNTER — Other Ambulatory Visit: Payer: Medicaid Other

## 2016-08-27 ENCOUNTER — Encounter: Payer: Medicaid Other | Admitting: Obstetrics & Gynecology

## 2016-08-30 ENCOUNTER — Encounter: Payer: Self-pay | Admitting: Obstetrics & Gynecology

## 2016-08-30 ENCOUNTER — Other Ambulatory Visit: Payer: Self-pay | Admitting: Obstetrics and Gynecology

## 2016-08-30 DIAGNOSIS — Z0489 Encounter for examination and observation for other specified reasons: Secondary | ICD-10-CM

## 2016-08-30 DIAGNOSIS — IMO0002 Reserved for concepts with insufficient information to code with codable children: Secondary | ICD-10-CM

## 2016-09-03 ENCOUNTER — Ambulatory Visit (INDEPENDENT_AMBULATORY_CARE_PROVIDER_SITE_OTHER): Payer: Self-pay

## 2016-09-03 ENCOUNTER — Ambulatory Visit (INDEPENDENT_AMBULATORY_CARE_PROVIDER_SITE_OTHER): Payer: Self-pay | Admitting: Obstetrics and Gynecology

## 2016-09-03 VITALS — BP 124/78 | Wt 268.0 lb

## 2016-09-03 DIAGNOSIS — O0992 Supervision of high risk pregnancy, unspecified, second trimester: Secondary | ICD-10-CM

## 2016-09-03 DIAGNOSIS — O99213 Obesity complicating pregnancy, third trimester: Secondary | ICD-10-CM

## 2016-09-03 DIAGNOSIS — Z3A24 24 weeks gestation of pregnancy: Secondary | ICD-10-CM

## 2016-09-03 DIAGNOSIS — O34219 Maternal care for unspecified type scar from previous cesarean delivery: Secondary | ICD-10-CM

## 2016-09-03 DIAGNOSIS — Z0489 Encounter for examination and observation for other specified reasons: Secondary | ICD-10-CM

## 2016-09-03 DIAGNOSIS — IMO0002 Reserved for concepts with insufficient information to code with codable children: Secondary | ICD-10-CM

## 2016-09-03 DIAGNOSIS — Z048 Encounter for examination and observation for other specified reasons: Secondary | ICD-10-CM

## 2016-09-03 NOTE — Progress Notes (Signed)
U/S today/GTT nv

## 2016-09-19 ENCOUNTER — Other Ambulatory Visit: Payer: Medicaid Other

## 2016-09-19 ENCOUNTER — Ambulatory Visit (INDEPENDENT_AMBULATORY_CARE_PROVIDER_SITE_OTHER): Payer: Medicaid Other

## 2016-09-19 ENCOUNTER — Ambulatory Visit (INDEPENDENT_AMBULATORY_CARE_PROVIDER_SITE_OTHER): Payer: Medicaid Other | Admitting: Obstetrics and Gynecology

## 2016-09-19 VITALS — BP 126/74 | Wt 271.0 lb

## 2016-09-19 DIAGNOSIS — O34219 Maternal care for unspecified type scar from previous cesarean delivery: Secondary | ICD-10-CM

## 2016-09-19 DIAGNOSIS — O0993 Supervision of high risk pregnancy, unspecified, third trimester: Secondary | ICD-10-CM

## 2016-09-19 DIAGNOSIS — Z362 Encounter for other antenatal screening follow-up: Secondary | ICD-10-CM | POA: Diagnosis not present

## 2016-09-19 DIAGNOSIS — Z6841 Body Mass Index (BMI) 40.0 and over, adult: Secondary | ICD-10-CM

## 2016-09-19 DIAGNOSIS — Z3A27 27 weeks gestation of pregnancy: Secondary | ICD-10-CM

## 2016-09-19 DIAGNOSIS — O0992 Supervision of high risk pregnancy, unspecified, second trimester: Secondary | ICD-10-CM

## 2016-09-19 DIAGNOSIS — Z3A24 24 weeks gestation of pregnancy: Secondary | ICD-10-CM

## 2016-09-19 DIAGNOSIS — O99213 Obesity complicating pregnancy, third trimester: Secondary | ICD-10-CM

## 2016-09-19 NOTE — Progress Notes (Signed)
No vb. No lof. Growth scan today and 28 week labs, nml grwoth but AC> 97th %ile.

## 2016-09-20 LAB — 28 WEEK RH+PANEL
BASOS: 0 %
Basophils Absolute: 0 10*3/uL (ref 0.0–0.2)
EOS (ABSOLUTE): 0.1 10*3/uL (ref 0.0–0.4)
EOS: 1 %
GESTATIONAL DIABETES SCREEN: 160 mg/dL — AB (ref 65–139)
HIV Screen 4th Generation wRfx: NONREACTIVE
Hematocrit: 37.6 % (ref 34.0–46.6)
Hemoglobin: 11.9 g/dL (ref 11.1–15.9)
IMMATURE GRANS (ABS): 0 10*3/uL (ref 0.0–0.1)
IMMATURE GRANULOCYTES: 0 %
Lymphocytes Absolute: 1.7 10*3/uL (ref 0.7–3.1)
Lymphs: 22 %
MCH: 27 pg (ref 26.6–33.0)
MCHC: 31.6 g/dL (ref 31.5–35.7)
MCV: 86 fL (ref 79–97)
Monocytes Absolute: 0.3 10*3/uL (ref 0.1–0.9)
Monocytes: 4 %
NEUTROS PCT: 73 %
Neutrophils Absolute: 5.6 10*3/uL (ref 1.4–7.0)
Platelets: 287 10*3/uL (ref 150–379)
RBC: 4.4 x10E6/uL (ref 3.77–5.28)
RDW: 14.5 % (ref 12.3–15.4)
RPR: NONREACTIVE
WBC: 7.7 10*3/uL (ref 3.4–10.8)

## 2016-09-24 ENCOUNTER — Other Ambulatory Visit: Payer: Self-pay | Admitting: Obstetrics and Gynecology

## 2016-09-24 DIAGNOSIS — O0993 Supervision of high risk pregnancy, unspecified, third trimester: Secondary | ICD-10-CM

## 2016-09-24 DIAGNOSIS — Z6841 Body Mass Index (BMI) 40.0 and over, adult: Secondary | ICD-10-CM

## 2016-09-24 DIAGNOSIS — O99810 Abnormal glucose complicating pregnancy: Secondary | ICD-10-CM

## 2016-09-26 ENCOUNTER — Other Ambulatory Visit: Payer: Medicaid Other

## 2016-09-27 ENCOUNTER — Other Ambulatory Visit: Payer: Medicaid Other

## 2016-10-04 ENCOUNTER — Telehealth: Payer: Self-pay

## 2016-10-04 ENCOUNTER — Ambulatory Visit (INDEPENDENT_AMBULATORY_CARE_PROVIDER_SITE_OTHER): Payer: Medicaid Other | Admitting: Advanced Practice Midwife

## 2016-10-04 VITALS — BP 128/80 | Wt 278.0 lb

## 2016-10-04 DIAGNOSIS — Z3A29 29 weeks gestation of pregnancy: Secondary | ICD-10-CM

## 2016-10-04 NOTE — Telephone Encounter (Signed)
Faxed

## 2016-10-04 NOTE — Patient Instructions (Signed)
Gestational Diabetes Mellitus, Self Care Caring for yourself after you have been diagnosed with gestational diabetes (gestational diabetes mellitus) means keeping your blood sugar (glucose) under control with a balance of:  Nutrition.  Exercise.  Lifestyle changes.  Medicines or insulin, if necessary.  Support from your team of health care providers and others.  The following information explains what you need to know to manage your gestational diabetes at home. What do I need to do to manage my blood glucose?  Check your blood glucose every day during your pregnancy. Do this as often as told by your health care provider.  Contact your health care provider if your blood glucose is above your target for 2 tests in a row. Your health care provider will set individualized treatment goals for you. Generally, the goal of treatment is to maintain the following blood glucose levels during pregnancy:  After not eating for 8 hours (after fasting): at or below 95 mg/dL (5.3 mmol/L).  After meals (postprandial): ? One hour after a meal: at or below 140 mg/dL (7.8 mmol/L). ? Two hours after a meal: at or below 120 mg/dL (6.7 mmol/L).  A1c (hemoglobin A1c) level: 6-6.5%.  What do I need to know about hyperglycemia and hypoglycemia? What is hyperglycemia? Hyperglycemia, also called high blood glucose, occurs when blood glucose is too high. Make sure you know the early signs of hyperglycemia, such as:  Increased thirst.  Hunger.  Feeling very tired.  Needing to urinate more often than usual.  Blurry vision.  What is hypoglycemia? Hypoglycemia, also called low blood glucose, occurswith a blood glucose level at or below 70 mg/dL (3.9 mmol/L). The risk for hypoglycemia increases during or after exercise, during sleep, during illness, and when skipping meals or not eating for a long time (fasting). It is important to know the symptoms of hypoglycemia and treat it right away. Always have a  15-gram rapid-acting carbohydrate snack with you to treat low blood glucose.Family members and close friends should also know the symptoms and should understand how to treat hypoglycemia, in case you are not able to treat yourself. What are the symptoms of hypoglycemia? Hypoglycemia symptoms can include:  Hunger.  Anxiety.  Sweating and feeling clammy.  Confusion.  Dizziness or feeling light-headed.  Sleepiness.  Nausea.  Increased heart rate.  Headache.  Blurry vision.  Seizure.  Nightmares.  Tingling or numbness around the mouth, lips, or tongue.  A change in speech.  Decreased ability to concentrate.  A change in coordination.  Restless sleep.  Tremors or shakes.  Fainting.  Irritability.  How do I treat hypoglycemia?  If you are alert and able to swallow safely, follow the 15:15 rule:  Take 15 grams of a rapid-acting carbohydrate. Rapid-acting options include: ? 1 tube of glucose gel. ? 3 glucose pills. ? 6-8 pieces of hard candy. ? 4 oz (120 mL) of fruit juice. ? 4 oz (120 mL) of regular (not diet) soda.  Check your blood glucose 15 minutes after you take the carbohydrate.  If the repeat blood glucose level is still at or below 70 mg/dL (3.9 mmol/L), take 15 grams of a carbohydrate again.  If your blood glucose level does not increase above 70 mg/dL (3.9 mmol/L) after 3 tries, seek emergency medical care.  After your blood glucose level returns to normal, eat a meal or a snack within 1 hour.  How do I treat severe hypoglycemia? Severe hypoglycemia is when your blood glucose level is at or below 54 mg/dL (  3 mmol/L). Severe hypoglycemia is an emergency. Do not wait to see if the symptoms will go away. Get medical help right away. Call your local emergency services (911 in the [place] Do not drive yourself to the hospital. If you have severe hypoglycemia and you cannot eat or drink, you may need an injection of glucagon. A family member or close  friend should learn how to check your blood glucose and how to give you a glucagon injection. Ask your health care provider if you need to have an emergency glucagon injection kit available. Severe hypoglycemia may need to be treated in a hospital. The treatment may include getting glucose through an IV tube. You may also need treatment for the cause of your hypoglycemia. What else can I do to manage my gestational diabetes? Take your diabetes medicines as told  If your health care provider prescribed insulin or diabetes medicines, take them every day.  Do not run out of insulin or other diabetes medicines that you take. Plan ahead so you always have these available.  If you use insulin, adjust your dosage based on how physically active you are and what foods you eat. Your health care provider will tell you how to adjust your dosage. Make healthy food choices  The things that you eat and drink affect your blood glucose. Making good choices helps to control your diabetes and prevent other health problems. A healthy meal plan includes eating lean proteins, complex carbohydrates, fresh fruits and vegetables, low-fat dairy products, and healthy fats. Make an appointment to see a diet and nutrition specialist (registered dietitian) to help you create an eating plan that is right for you. Make sure that you:  Follow instructions from your health care provider about eating or drinking restrictions.  Drink enough fluid to keep your urine clear or pale yellow.  Eat healthy snacks between nutritious meals.  Track the carbohydrates that you eat. Do this by reading food labels and learning the standard serving sizes of foods.  Follow your sick day plan whenever you cannot eat or drink as usual. Make this plan in advance with your health care provider.  Stay active   Do at least 30 minutes of physical activity a day, or as much physical activity as your health care provider recommends during your  pregnancy. ? Doing 10 minutes of exercise 30 minutes after each meal may help to control postprandial blood glucose levels.  If you start a new exercise or activity, work with your health care provider to adjust your insulin, medicines, or food intake as needed. Make healthy lifestyle choices  Do not drink alcohol.  Do not use any tobacco products, such as cigarettes, chewing tobacco, and e-cigarettes. If you need help quitting, ask your health care provider.  Learn to manage stress. If you need help with this, ask your health care provider. Care for your body  Keep your immunizations up to date.  Brush your teeth and gums two times a day, and floss at least one time a day.  Visit your dentist at least once every 6 months.  Maintain a healthy weight during your pregnancy. General instructions   Take over-the-counter and prescription medicines only as told by your health care provider.  Talk with your health care provider about your risk for high blood pressure during pregnancy (preeclampsia or eclampsia).  Share your diabetes management plan with people in your workplace, school, and household.  Check your urine for ketones during your pregnancy when you are ill and   as told by your health care provider.  Carry a medical alert card or wear medical alert jewelry.  Ask your health care provider: ? Do I need to meet with a diabetes educator? ? Where can I find a support group for people with diabetes?  Keep all follow-up visits during your pregnancy (prenatal) and after delivery (postnatal) as told by your health care provider. This is important. Get the care that you need after delivery  Have your blood glucose level checked 4-12 weeks after delivery. This is done with an oral glucose tolerance test (OGTT).  Get screened for diabetes at least every 3 years, or as often as told by your health care provider. Where to find more information: To learn more about gestational  diabetes, visit:  American Diabetes Association (ADA): www.diabetes.org/diabetes-basics/gestational  Centers for Disease Control and Prevention (CDC): www.cdc.gov/diabetes/pubs/pdf/gestationalDiabetes.pdf  This information is not intended to replace advice given to you by your health care provider. Make sure you discuss any questions you have with your health care provider. Document Released: 07/17/2015 Document Revised: 08/31/2015 Document Reviewed: 04/28/2015 Elsevier Interactive Patient Education  2017 Elsevier Inc.  

## 2016-10-04 NOTE — Progress Notes (Signed)
Has 3 hr gtt scheduled for 7/2. Good fetal movement. No LOF, VB. Requests scheduling of c/s today. I will call her later with information.

## 2016-10-04 NOTE — Telephone Encounter (Signed)
Pt was seen today and forgot to ask for a doctor's note.  Please fax to [phone] and call pt at [phone] after it's faxed.

## 2016-10-07 ENCOUNTER — Observation Stay
Admission: EM | Admit: 2016-10-07 | Discharge: 2016-10-07 | Disposition: A | Discharge status: Home / Self Care | Payer: Medicaid Other | Attending: Certified Nurse Midwife | Admitting: Certified Nurse Midwife

## 2016-10-07 ENCOUNTER — Telehealth: Payer: Self-pay | Admitting: Obstetrics and Gynecology

## 2016-10-07 ENCOUNTER — Other Ambulatory Visit: Payer: Medicaid Other

## 2016-10-07 DIAGNOSIS — O99213 Obesity complicating pregnancy, third trimester: Secondary | ICD-10-CM | POA: Diagnosis not present

## 2016-10-07 DIAGNOSIS — O0993 Supervision of high risk pregnancy, unspecified, third trimester: Secondary | ICD-10-CM

## 2016-10-07 DIAGNOSIS — F329 Major depressive disorder, single episode, unspecified: Secondary | ICD-10-CM | POA: Insufficient documentation

## 2016-10-07 DIAGNOSIS — B373 Candidiasis of vulva and vagina: Secondary | ICD-10-CM | POA: Diagnosis not present

## 2016-10-07 DIAGNOSIS — Z3A29 29 weeks gestation of pregnancy: Secondary | ICD-10-CM | POA: Insufficient documentation

## 2016-10-07 DIAGNOSIS — F419 Anxiety disorder, unspecified: Secondary | ICD-10-CM | POA: Diagnosis not present

## 2016-10-07 DIAGNOSIS — O23593 Infection of other part of genital tract in pregnancy, third trimester: Secondary | ICD-10-CM | POA: Diagnosis not present

## 2016-10-07 DIAGNOSIS — E669 Obesity, unspecified: Secondary | ICD-10-CM | POA: Insufficient documentation

## 2016-10-07 DIAGNOSIS — R109 Unspecified abdominal pain: Secondary | ICD-10-CM

## 2016-10-07 DIAGNOSIS — O2343 Unspecified infection of urinary tract in pregnancy, third trimester: Secondary | ICD-10-CM | POA: Insufficient documentation

## 2016-10-07 DIAGNOSIS — O9989 Other specified diseases and conditions complicating pregnancy, childbirth and the puerperium: Secondary | ICD-10-CM | POA: Diagnosis present

## 2016-10-07 DIAGNOSIS — Z6841 Body Mass Index (BMI) 40.0 and over, adult: Secondary | ICD-10-CM | POA: Insufficient documentation

## 2016-10-07 DIAGNOSIS — O99343 Other mental disorders complicating pregnancy, third trimester: Secondary | ICD-10-CM | POA: Insufficient documentation

## 2016-10-07 DIAGNOSIS — B3731 Acute candidiasis of vulva and vagina: Secondary | ICD-10-CM

## 2016-10-07 DIAGNOSIS — O26899 Other specified pregnancy related conditions, unspecified trimester: Secondary | ICD-10-CM | POA: Diagnosis present

## 2016-10-07 DIAGNOSIS — O34219 Maternal care for unspecified type scar from previous cesarean delivery: Secondary | ICD-10-CM | POA: Insufficient documentation

## 2016-10-07 LAB — URINALYSIS, COMPLETE (UACMP) WITH MICROSCOPIC
BILIRUBIN URINE: NEGATIVE
Glucose, UA: NEGATIVE mg/dL
Hgb urine dipstick: NEGATIVE
KETONES UR: NEGATIVE mg/dL
Nitrite: NEGATIVE
PROTEIN: NEGATIVE mg/dL
SPECIFIC GRAVITY, URINE: 1.025 (ref 1.005–1.030)
pH: 5 (ref 5.0–8.0)

## 2016-10-07 MED ORDER — TERCONAZOLE 80 MG VA SUPP: 80.0000 mg | Freq: Every day | VAGINAL | 0 refills | Status: DC

## 2016-10-07 NOTE — Telephone Encounter (Signed)
Pt is very uncomfortable, lot of pain/pressure, good fm, milky d/c, ctxs q6-287min.  Adv to go to L&D via ED.  Lori notified.

## 2016-10-07 NOTE — Telephone Encounter (Signed)
Pt is calling having some pain and discomfort and would like to speak with an Nurse. Please call patient

## 2016-10-07 NOTE — Discharge Summary (Signed)
Patient discharged with instructions on follow up appointment, new prescription, labor precautions, and when to seek medical attention. Patient verbalized understanding of discharge instructions. Patient ambulatory at discharged with steady gait and no complaints.

## 2016-10-07 NOTE — Final Progress Note (Signed)
Physician Final Progress Note  Patient ID: ROSALIND GUIDO MRN: [id number] DOB/AGE: 1991/02/05 26 y.o.  Admit date: 10/07/2016 Admitting provider: Nadara Mustard, MD Discharge date: 10/07/2016   Admission Diagnoses: IUP at 29wk4d with lower abdominal cramping and vaginal discharge  Discharge Diagnoses:  IUP at 29wk4d Monilial vaginitis Pyuria-R/O UTI Consults: None  Significant Findings/ Diagnostic Studies: 26 year old G5 P25 with EDC=12/19/2016 by LMP and confirmed by a 9 week ultrasound at [facility] who presented at 29.[redacted]weeks gestation with complaints of lower abdominal cramping and a white milky discharge x 1 day. Denies vulvar itching and irritation, dysuria, vaginal bleeding, diarrhea, nausea or vomiting. Baby active. Prenatal care at [facility] remarkable for a history of 3 Cesarean sections, obesity with BMI>40, chronic hypertension (not currently on medication), and depression/anxiety (no meds). Recent 1 hour GTT was 160. Missed 3 hour GTT appointment today.  OB History  Gravida Para Term Preterm AB Living  5 3 3  0 1 3  SAB TAB Ectopic Multiple Live Births        0 3    # Outcome Date GA Lbr Len/2nd Weight Sex Delivery Anes PTL Lv  5 Current           4 AB 10/2015          3 Term 05/16/15 [redacted]w[redacted]d  3.57 kg (7 lb 13.9 oz) M CS-LTranv Spinal  LIV  2 Term 06/29/13   3.515 kg (7 lb 12 oz) M CS-LTranv   LIV  1 Term 02/17/11   3.884 kg (8 lb 9 oz) M CS-LTranv   LIV     Complications: Fetal distress affecting delivery,Meconium in amniotic fluid     Family History  Problem Relation Age of Onset  . Hypertension Father     Social History   Social History  . Marital status: Single    Spouse name: N/A  . Number of children: 3  . Years of education: N/A   Occupational History  . Not on file.   Social History Main Topics  . Smoking status: Never Smoker  . Smokeless tobacco: Never Used  . Alcohol use No  . Drug use: No  . Sexual activity: Yes   Other Topics Concern  . Not on file    Social History Narrative  . No narrative on file    Exam: BP 125/79   Pulse (!) 109   Temp 99 F (37.2 C) (Oral)   Resp 18   Ht 5\' 4"  (1.626 m)   Wt 126.1 kg (278 lb)   LMP 03/14/2016 (Exact Date)   BMI 47.72 kg/m  (initial BP 141/83 but cuff not appropriate size) General: smiling, gravid BF, in NAD. Abdomen: obese, NT fundus and upper abdomen. Mild tenderness in RLQ/right groin. No guarding FHR: 150 baseline with accelerations to 170s to 180, moderate variability Toco: some uterine irritability Pelvic exam: Vulva: white discharge near introitus/ irritation of labia majora Vagina: white mucoepithelial discharge Wet prep: positive for hyphae, negative for clue cells and Trich Cervix: closed/ long/firm/ OOP Results for orders placed or performed during the hospital encounter of 10/07/16 (from the past 24 hour(s))  Urinalysis, Complete w Microscopic     Status: Abnormal   Collection Time: 10/07/16  5:57 PM  Result Value Ref Range   Color, Urine YELLOW (A) YELLOW   APPearance CLOUDY (A) CLEAR   Specific Gravity, Urine 1.025 1.005 - 1.030   pH 5.0 5.0 - 8.0   Glucose, UA NEGATIVE NEGATIVE mg/dL   Hgb urine  dipstick NEGATIVE NEGATIVE   Bilirubin Urine NEGATIVE NEGATIVE   Ketones, ur NEGATIVE NEGATIVE mg/dL   Protein, ur NEGATIVE NEGATIVE mg/dL   Nitrite NEGATIVE NEGATIVE   Leukocytes, UA LARGE (A) NEGATIVE   RBC / HPF 6-30 0 - 5 RBC/hpf   WBC, UA 6-30 0 - 5 WBC/hpf   Bacteria, UA RARE (A) NONE SEEN   Squamous Epithelial / LPF 6-30 (A) NONE SEEN   Mucous PRESENT    Ca Oxalate Crys, UA PRESENT    A: IUP at 29 weeks 4 days with monilial vaginitis No evidence of preterm labor R/O UTI  P: Terazol 3 suppositories-one PV qhs x 3 nights Urine culture [facility] as scheduled or sooner prn Will have front desk call patient tomorrow to reschedule 3 hour GTT.   Procedures: none  Discharge Condition: stable  Disposition: 01-Home or Self Care  Diet: Regular  diet  Discharge Activity: Activity as tolerated  Discharge Instructions    Discharge patient    Complete by:  As directed    Discharge disposition:  01-Home or Self Care   Discharge patient date:  10/07/2016     Allergies as of 10/07/2016   No Known Allergies     Medication List    TAKE these medications   terconazole 80 MG vaginal suppository Commonly known as:  TERAZOL 3 Place 1 suppository (80 mg total) vaginally at bedtime.      Follow-up Information    [facility] Follow up.   Why:  please keep scheduled OB appointment Contact information: [address] [place] [phone]          Total time spent taking care of this patient: 20 minutes  Signed: Farrel Connersolleen Kalyna Paolella 10/07/2016, 10:04 PM

## 2016-10-07 NOTE — Telephone Encounter (Signed)
Tried to call patient and phone does not accept incoming calls.

## 2016-10-07 NOTE — OB Triage Note (Signed)
Ms. Natasha Davidson here with lower abdominal cramping, lower back pain and vaginal pressure, denies bleeding, reports "milky discharge", positive fetal movement. Denies HA, visual changes, states she has GDM, missed appointment today for 3 hour glucose

## 2016-10-08 ENCOUNTER — Telehealth: Payer: Self-pay | Admitting: Obstetrics and Gynecology

## 2016-10-08 ENCOUNTER — Telehealth: Payer: Self-pay | Admitting: Certified Nurse Midwife

## 2016-10-08 NOTE — Telephone Encounter (Signed)
Sent  e-mail attempting to reach patient

## 2016-10-08 NOTE — Telephone Encounter (Signed)
-----   Message from Farrel Connersolleen Gutierrez, [place] sent at 10/07/2016 10:13 PM EDT ----- Regarding: rescheduling 3 hour GTT Please call and reschedule 3 hour GTT. She no showed for 7/2 appointment

## 2016-10-08 NOTE — Telephone Encounter (Signed)
Patient's phone# [phone] does not accept incoming calls. Next appt 7/16 @ 4:30pm, note on appt that patient needs to see me for c/s scheduling.

## 2016-10-08 NOTE — Telephone Encounter (Signed)
Unable to reach patient due to her phone number not allowing incoming call. Please schedule patient when she calls back

## 2016-10-08 NOTE — Telephone Encounter (Signed)
-----   Message from Tresea MallJane Gledhill, CNM sent at 10/04/2016  5:13 PM EDT ----- Surgery Booking Request Patient Full Name:  Natasha CaoKeyonna Davidson MRN: [id number]  DOB: 05/14/1990  Surgeon: Thomasene MohairStephen Jackson Requested Surgery Date and Time: December 13, 2016 Primary Diagnosis AND Code: Repeat c/section Secondary Diagnosis and Code:  Surgical Procedure: Cesarean Section L&D Notification: Yes Admission Status: surgery admit Length of Surgery: 60 m Special Case Needs: On Q pump H&P:  (date) Phone Interview???:  Interpreter: NA Language: English Medical Clearance:  Special Scheduling Instructions: I am not sure if she wants BTL

## 2016-10-09 LAB — URINE CULTURE

## 2016-10-10 NOTE — Telephone Encounter (Signed)
I attempted to contact the patient again, but the phone still does not accept incoming calls. °

## 2016-10-11 NOTE — Telephone Encounter (Signed)
I attempted to contact the patient again, but the phone still does not accept incoming calls.

## 2016-10-14 NOTE — Telephone Encounter (Signed)
I attempted to contact the patient again, but the phone still does not accept incoming calls.

## 2016-10-14 NOTE — Telephone Encounter (Signed)
Pt is schedule 09/23/16

## 2016-10-14 NOTE — Telephone Encounter (Signed)
Pt does not agree c c/d date.  Is calling to be sure we received her earlier msg.  [phone]

## 2016-10-14 NOTE — Telephone Encounter (Signed)
Patient is aware of [facility] on 12/12/16 @ 8:10am with Pre-admit Testing afterwards, and OR on 12/13/16. Patient was given my ext.

## 2016-10-21 ENCOUNTER — Encounter: Payer: Medicaid Other | Admitting: Obstetrics and Gynecology

## 2016-10-23 ENCOUNTER — Other Ambulatory Visit: Payer: Medicaid Other

## 2016-10-24 ENCOUNTER — Other Ambulatory Visit: Payer: Self-pay | Admitting: Certified Nurse Midwife

## 2016-10-28 ENCOUNTER — Telehealth: Payer: Self-pay

## 2016-10-28 NOTE — Telephone Encounter (Signed)
Pt is wanting to change appt to be seen after 2:30 due to work schedule. She states that she called triage due to front desk stating that appt can not be changed and she is concerned regarding missing additional appts because of the number she has already missed. Returned pts call. LVM. Due to type of appt she must be seen in the am(3hr gtt) and must be fasting.

## 2016-10-29 ENCOUNTER — Observation Stay
Admission: EM | Admit: 2016-10-29 | Discharge: 2016-10-29 | Disposition: A | Discharge status: Home / Self Care | Payer: Medicaid Other | Attending: Certified Nurse Midwife | Admitting: Certified Nurse Midwife

## 2016-10-29 DIAGNOSIS — W19XXXA Unspecified fall, initial encounter: Secondary | ICD-10-CM | POA: Diagnosis not present

## 2016-10-29 DIAGNOSIS — O26899 Other specified pregnancy related conditions, unspecified trimester: Secondary | ICD-10-CM

## 2016-10-29 DIAGNOSIS — O368130 Decreased fetal movements, third trimester, not applicable or unspecified: Secondary | ICD-10-CM | POA: Diagnosis not present

## 2016-10-29 DIAGNOSIS — Z3A32 32 weeks gestation of pregnancy: Secondary | ICD-10-CM | POA: Diagnosis not present

## 2016-10-29 DIAGNOSIS — O0993 Supervision of high risk pregnancy, unspecified, third trimester: Secondary | ICD-10-CM

## 2016-10-29 DIAGNOSIS — O9A213 Injury, poisoning and certain other consequences of external causes complicating pregnancy, third trimester: Secondary | ICD-10-CM | POA: Diagnosis not present

## 2016-10-29 DIAGNOSIS — R109 Unspecified abdominal pain: Secondary | ICD-10-CM

## 2016-10-29 MED ORDER — BENZOCAINE-MENTHOL 20-0.5 % EX AERO
1.0000 "application " | INHALATION_SPRAY | Freq: Four times a day (QID) | CUTANEOUS | Status: DC | PRN
  Filled 2016-10-29: qty 56

## 2016-10-29 MED ORDER — LACTATED RINGERS IV SOLN: 500.0000 mL | INTRAVENOUS | Status: DC | PRN

## 2016-10-29 NOTE — OB Triage Note (Addendum)
Pt presents c/o falling at work this morning around 0830. She states she landed on her left side, unsure if she hit her belly at all. Denies Any bleeding or LOF. Pt states "my stomach is cramping a little bit and my leg is hurting really bad." Small skin tear noted on patient's left shin. Vitals WNL. Will continue to monitor.

## 2016-10-29 NOTE — Final Progress Note (Signed)
Physician Final Progress Note  Patient ID: Natasha Davidson MRN: [id number] DOB/AGE: 25/09/1990 25 y.o.  Admit date: 10/29/2016 Admitting provider: Conard NovakStephen D Jackson, MD Discharge date: 10/29/2016   Admission Diagnoses: Fall during pregnancy IUP at 32wk5d  Discharge Diagnoses:  S/p fall at work IUP at 32wk5d Superficial abrasion left lower leg   Consults: None  Significant Findings: 26 year old G5 [id number] with EDC=12/19/2016 presents at 32wk5d following a fall at work around [employer]. 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 Complained of cramping on arrival and decreased fetal movement. Denies vaginal bleeding or leakage of fluid. [facility] complicated by obesity with BMI>40, an elevated 1 hour GTT=160 (has not had her 3hr GTT yet), close interconceptual spacing, and previous Cesarean section x 3.   Exam: BP 138/82   Pulse (!) 104   Temp 98.2 F (36.8 C) (Oral)   Resp 18   Ht 5\' 4"  (1.626 m)   Wt 126.1 kg (278 lb)   LMP 03/14/2016 (Exact Date)   SpO2 100%   BMI 47.72 kg/m    Abdomen: no bruising or lacerations, obese FHR: 145-150 with accelerations to 180s-190, moderate variability, no decelerations Toco: acontractile Extremities: trace edema lower extremities, 3cm superficial abrasion left lower leg. Surrounding area extending down to ankle is sore to touch.  A: IUP at 32wk5d s/p fall Reactive NST No evidence of fetal compromise/abruption Superficial abrasion left lower leg  P: Wash abrasion and treat with Dermaplast Follow up with [facility] at scheduled appt 26 July (3hr GTT and ROB) RTN prn decreased fetal movement, bleeding, contractions.   Procedures: NST  Discharge Condition: stable  Disposition: 01-Home or Self Care  Diet: Regular diet  Discharge Activity: Activity as tolerated  Discharge Instructions    Discharge patient    Complete by:  As directed    Discharge  disposition:  01-Home or Self Care   Discharge patient date:  10/29/2016     Allergies as of 10/29/2016   No Known Allergies     Medication List    STOP taking these medications   terconazole 80 MG vaginal suppository Commonly known as:  TERAZOL 3        Total time spent taking care of this patient: 15 minutes  Signed: Farrel Connersolleen Debbera Wolken 10/29/2016, 11:23 AM

## 2016-10-30 NOTE — Telephone Encounter (Signed)
Thank you for letting me know.  From my knowledge, clt recently acquired a new job which may be the reason why she wants to be seen after 2:30 pm.  Unfortunately, It may be that she will not be able to do the 3 hour gtt due to her work schedule.  I will attempt to contact clt.  Is there any other way to remedy this situation?  Can clt be [facility] if she is unable to complete a 3 hour GTT?

## 2016-10-31 ENCOUNTER — Ambulatory Visit (INDEPENDENT_AMBULATORY_CARE_PROVIDER_SITE_OTHER): Payer: Medicaid Other | Admitting: Advanced Practice Midwife

## 2016-10-31 ENCOUNTER — Other Ambulatory Visit: Payer: Medicaid Other

## 2016-10-31 VITALS — BP 128/84 | Wt 282.0 lb

## 2016-10-31 DIAGNOSIS — Z6841 Body Mass Index (BMI) 40.0 and over, adult: Secondary | ICD-10-CM

## 2016-10-31 DIAGNOSIS — Z3A33 33 weeks gestation of pregnancy: Secondary | ICD-10-CM

## 2016-10-31 DIAGNOSIS — O0993 Supervision of high risk pregnancy, unspecified, third trimester: Secondary | ICD-10-CM

## 2016-10-31 DIAGNOSIS — O99810 Abnormal glucose complicating pregnancy: Secondary | ICD-10-CM

## 2016-10-31 NOTE — Progress Notes (Signed)
Increased emotional and physical concerns today. EPDS score today is:  11. Feeling SOB and pain in center of chest. Reviewed normal for pregnancy with heartburn and shortness of breath. Precautions given if she has serious concerns to go to ER. Good fetal movement. No LOF, VB. Jola BabinskiMarilyn to f/u with patient for resource needs. F/U as needed for GDM.

## 2016-10-31 NOTE — Progress Notes (Signed)
3 hr gtt today, pt has been sick on stomach since last night.

## 2016-11-01 ENCOUNTER — Other Ambulatory Visit: Payer: Self-pay | Admitting: Advanced Practice Midwife

## 2016-11-01 DIAGNOSIS — O24419 Gestational diabetes mellitus in pregnancy, unspecified control: Secondary | ICD-10-CM

## 2016-11-01 DIAGNOSIS — R739 Hyperglycemia, unspecified: Secondary | ICD-10-CM

## 2016-11-01 LAB — GESTATIONAL GLUCOSE TOLERANCE
GLUCOSE 1 HOUR GTT: 196 mg/dL — AB (ref 65–179)
GLUCOSE 2 HOUR GTT: 183 mg/dL — AB (ref 65–154)
Glucose, Fasting: 126 mg/dL — ABNORMAL HIGH (ref 65–94)
Glucose, GTT - 3 Hour: 131 mg/dL (ref 65–139)

## 2016-11-01 NOTE — Progress Notes (Signed)
Referral sent for [facility] due to 3/4 3 hour gtt levels elevated. Attempted to call patient with results. Left message for patient on mobile phone.

## 2016-11-01 NOTE — Telephone Encounter (Signed)
Pt calling to get results from yesterday, please advise

## 2016-11-01 NOTE — Telephone Encounter (Signed)
Task sent to [facility], that is who patient saw yesterday

## 2016-11-04 ENCOUNTER — Other Ambulatory Visit: Payer: Self-pay | Admitting: Obstetrics and Gynecology

## 2016-11-04 ENCOUNTER — Encounter: Payer: Self-pay | Admitting: Obstetrics and Gynecology

## 2016-11-04 DIAGNOSIS — O24419 Gestational diabetes mellitus in pregnancy, unspecified control: Secondary | ICD-10-CM

## 2016-11-04 DIAGNOSIS — O24415 Gestational diabetes mellitus in pregnancy, controlled by oral hypoglycemic drugs: Secondary | ICD-10-CM | POA: Insufficient documentation

## 2016-11-04 MED ORDER — ACCU-CHEK NANO SMARTVIEW W/DEVICE KIT: 1.0000 | PACK | 0 refills | Status: DC

## 2016-11-04 MED ORDER — ACCU-CHEK FASTCLIX LANCETS MISC: 1.0000 [IU] | Freq: Four times a day (QID) | 12 refills | Status: DC

## 2016-11-04 MED ORDER — GLUCOSE BLOOD VI STRP: ORAL_STRIP | 12 refills | Status: DC

## 2016-11-05 ENCOUNTER — Telehealth: Payer: Self-pay | Admitting: Obstetrics and Gynecology

## 2016-11-05 NOTE — Telephone Encounter (Signed)
-----   Message from Vena AustriaAndreas Staebler, MD sent at 11/04/2016  8:21 AM EDT ----- Regarding: US Needs growth US added to her next ob appointment order is in

## 2016-11-05 NOTE — Telephone Encounter (Signed)
Called and left vociemail for patient to call back to schedule appt

## 2016-11-07 ENCOUNTER — Ambulatory Visit: Payer: Medicaid Other | Admitting: *Deleted

## 2016-11-15 ENCOUNTER — Ambulatory Visit (INDEPENDENT_AMBULATORY_CARE_PROVIDER_SITE_OTHER): Payer: Medicaid Other

## 2016-11-15 ENCOUNTER — Encounter: Payer: Medicaid Other | Admitting: Obstetrics and Gynecology

## 2016-11-15 ENCOUNTER — Ambulatory Visit (INDEPENDENT_AMBULATORY_CARE_PROVIDER_SITE_OTHER): Payer: Medicaid Other | Admitting: Obstetrics and Gynecology

## 2016-11-15 VITALS — BP 128/88 | Wt 284.0 lb

## 2016-11-15 DIAGNOSIS — Z6841 Body Mass Index (BMI) 40.0 and over, adult: Secondary | ICD-10-CM

## 2016-11-15 DIAGNOSIS — O34219 Maternal care for unspecified type scar from previous cesarean delivery: Secondary | ICD-10-CM

## 2016-11-15 DIAGNOSIS — O24419 Gestational diabetes mellitus in pregnancy, unspecified control: Secondary | ICD-10-CM | POA: Diagnosis not present

## 2016-11-15 DIAGNOSIS — Z3A35 35 weeks gestation of pregnancy: Secondary | ICD-10-CM

## 2016-11-15 DIAGNOSIS — O0993 Supervision of high risk pregnancy, unspecified, third trimester: Secondary | ICD-10-CM

## 2016-11-15 DIAGNOSIS — O99213 Obesity complicating pregnancy, third trimester: Secondary | ICD-10-CM

## 2016-11-15 DIAGNOSIS — O24415 Gestational diabetes mellitus in pregnancy, controlled by oral hypoglycemic drugs: Secondary | ICD-10-CM

## 2016-11-15 MED ORDER — GLYBURIDE 2.5 MG PO TABS: 2.5000 mg | ORAL_TABLET | Freq: Every day | ORAL | 0 refills | Status: DC

## 2016-11-15 NOTE — Progress Notes (Signed)
Routine Prenatal Care Visit  Subjective  Natasha Davidson is a 26 y.o. [id number] at 5448w3d being seen today for ongoing prenatal care.  She is currently monitored for the following issues for this high-risk pregnancy and has Obesity affecting pregnancy in third trimester, antepartum; BMI 40.0-44.9, adult (HCC); History of cesarean section complicating pregnancy; Supervision of high-risk pregnancy; Abdominal cramping affecting pregnancy, antepartum; Fall; and Oral hypoglycemic controlled White classification A2 gestational diabetes mellitus (GDM) on her problem list.  ----------------------------------------------------------------------------------- Patient reports no complaints.   Contractions: Not present. Vag. Bleeding: None.  Movement: Present. Denies leaking of fluid.  GDM - values at all times of day consistently above range.  Starting glyburide today. Discussed risks associated with glyburide. Discussed insulin as first line and may start, if no quick control of GDM with glyburide. I do not think she would be a good candidate for metformin. Starting glyburide in AM to assess response.  Then will likely need PM glyburide.  Growth u/s today - 78th %ile, AFI 18.6 cm  ----------------------------------------------------------------------------------- The following portions of the patient's history were reviewed and updated as appropriate: allergies, current medications, past family history, past medical history, past social history, past surgical history and problem list. Problem list updated.   Objective  Blood pressure 128/88, weight 284 lb (128.8 kg), last menstrual period 03/14/2016, not currently breastfeeding. Pregravid weight 251 lb (113.9 kg) Total Weight Gain 33 lb (15 kg) Urinalysis: Urine Protein: Negative Urine Glucose: Negative  Fetal Status: Fetal Heart Rate (bpm): 155   Movement: Present     General:  Alert, oriented and cooperative. Patient is in no acute distress.  Skin: Skin  is warm and dry. No rash noted.   Cardiovascular: Normal heart rate noted  Respiratory: Normal respiratory effort, no problems with respiration noted  Abdomen: Soft, gravid, appropriate for gestational age. Pain/Pressure: Absent     Pelvic:  Cervical exam deferred        Extremities: Normal range of motion.     ental Status: Normal mood and affect. Normal behavior. Normal judgment and thought content.     Assessment   26 y.o. [id number] at 4648w3d by  12/19/2016, by Last Menstrual Period presenting for routine prenatal visit  Plan   Pregnancy #5 Problems (from 03/14/16 to present)    Problem Noted Resolved   Oral hypoglycemic controlled White classification A2 gestational diabetes mellitus (GDM) 11/04/2016 by Vena AustriaStaebler, Andreas, MD No   Overview Addendum 11/15/2016  1:18 PM by Conard NovakJackson, Haizlee Henton D, MD    Required Referrals for A1GDM or A2GDM: [x]  Diabetes Education and Testing Supplies [x]  Nutrition Cousult [x]  start glybride 2.5 mg AM on 8/10 for elevated BG values  Baseline and surveillance labs [facility], refresh links as needed)  Lab Results  Component Value Date   CREATININE 0.55 08/21/2016   AST 12 (L) 08/21/2016   ALT 7 (L) 08/21/2016   PROTCRRATIO 0.05 08/21/2016   Lab Results  Component Value Date   HGBA1C 5.7 (H) 07/30/2016    Antenatal Testing Class of DM U/S NST/AFI DELIVERY  Diabetes   A1 - good control - O24.410    A2 - good control - O24.419      A2  - poor control or poor compliance - O24.419, E11.65   (Macrosomia or polyhydramnios) **E11.65 is extra code for poor control**    A2/B - O24.919  and B-C O24.319  Poor control B-C or D-R-F-T - O24.319  or  Type I DM - O24.019  20-38  20-38  20-24-28-32-36   20-24-28-32-35-38//fetal echo  20-24-27-30-33-36-38//fetal echo  40  32//2 x wk  32//2 x wk   32//2 x wk  28//BPP wkly then 32//2 x wk  40  39  PRN   39  PRN          Supervision of high-risk pregnancy 07/30/2016 by  Vena Austria, MD No   Overview Addendum 11/17/2016  2:21 PM by Conard Novak, MD    [facility] Prenatal Labs  Dating LMP c/w 9wk [place] Blood type: A/Positive/-- (04/24 1044)   Quad  negative Antibody:Negative (04/24 1044)  Anatomic [place] Complete 5/29 Rubella: 1.14 (04/24 1044) Varicella: Immune  GTT Early:               Third trimester: 160 - failed 3h = GDM RPR: Non Reactive (04/24 1044)   Rhogam  HBsAg: Negative (04/24 1044)   TDaP vaccine                       Flu Shot: HIV: Non Reactive (04/24 1044)   Baby Food                                GBS:   Contraception  Pap: 05/27/16 NIL  CBB     CS/VBAC    Support Person                  Discussed importance of keeping routine OB appointments. Very inconsistent in last couple of months, especially with GDM.  Start NST/AFI weekly next week.  Monitor BG levels closely. Discussed risk of fetal death and newborn issues if BG values not well controlled.  Preterm labor symptoms and general obstetric precautions including but not limited to vaginal bleeding, contractions, leaking of fluid and fetal movement were reviewed in detail with the patient. Please refer to After Visit Summary for other counseling recommendations.   Return in about 4 days (around 11/19/2016) for NST and routine prenatal.  Thomasene Mohair, MD  11/17/2016 2:22 PM

## 2016-11-20 ENCOUNTER — Observation Stay
Admission: EM | Admit: 2016-11-20 | Discharge: 2016-11-20 | Disposition: A | Discharge status: Home / Self Care | Payer: Medicaid Other | Attending: Certified Nurse Midwife | Admitting: Certified Nurse Midwife

## 2016-11-20 ENCOUNTER — Ambulatory Visit (INDEPENDENT_AMBULATORY_CARE_PROVIDER_SITE_OTHER): Payer: Medicaid Other | Admitting: Obstetrics and Gynecology

## 2016-11-20 VITALS — BP 148/96 | Wt 286.0 lb

## 2016-11-20 DIAGNOSIS — O163 Unspecified maternal hypertension, third trimester: Secondary | ICD-10-CM | POA: Diagnosis not present

## 2016-11-20 DIAGNOSIS — Z9889 Other specified postprocedural states: Secondary | ICD-10-CM | POA: Insufficient documentation

## 2016-11-20 DIAGNOSIS — O0993 Supervision of high risk pregnancy, unspecified, third trimester: Secondary | ICD-10-CM | POA: Diagnosis not present

## 2016-11-20 DIAGNOSIS — O24419 Gestational diabetes mellitus in pregnancy, unspecified control: Secondary | ICD-10-CM | POA: Insufficient documentation

## 2016-11-20 DIAGNOSIS — Z8249 Family history of ischemic heart disease and other diseases of the circulatory system: Secondary | ICD-10-CM | POA: Diagnosis not present

## 2016-11-20 DIAGNOSIS — O24415 Gestational diabetes mellitus in pregnancy, controlled by oral hypoglycemic drugs: Secondary | ICD-10-CM

## 2016-11-20 DIAGNOSIS — O34219 Maternal care for unspecified type scar from previous cesarean delivery: Secondary | ICD-10-CM

## 2016-11-20 DIAGNOSIS — Z6841 Body Mass Index (BMI) 40.0 and over, adult: Secondary | ICD-10-CM

## 2016-11-20 DIAGNOSIS — Z8679 Personal history of other diseases of the circulatory system: Secondary | ICD-10-CM

## 2016-11-20 DIAGNOSIS — O99213 Obesity complicating pregnancy, third trimester: Secondary | ICD-10-CM

## 2016-11-20 DIAGNOSIS — Z3A35 35 weeks gestation of pregnancy: Secondary | ICD-10-CM

## 2016-11-20 LAB — COMPREHENSIVE METABOLIC PANEL
ALT: 11 U/L — ABNORMAL LOW (ref 14–54)
AST: 17 U/L (ref 15–41)
Albumin: 2.6 g/dL — ABNORMAL LOW (ref 3.5–5.0)
Alkaline Phosphatase: 131 U/L — ABNORMAL HIGH (ref 38–126)
Anion gap: 8 (ref 5–15)
BUN: 6 mg/dL (ref 6–20)
CHLORIDE: 107 mmol/L (ref 101–111)
CO2: 22 mmol/L (ref 22–32)
Calcium: 8.7 mg/dL — ABNORMAL LOW (ref 8.9–10.3)
Creatinine, Ser: 0.63 mg/dL (ref 0.44–1.00)
Glucose, Bld: 109 mg/dL — ABNORMAL HIGH (ref 65–99)
POTASSIUM: 3.6 mmol/L (ref 3.5–5.1)
Sodium: 137 mmol/L (ref 135–145)
Total Bilirubin: 0.2 mg/dL — ABNORMAL LOW (ref 0.3–1.2)
Total Protein: 6.4 g/dL — ABNORMAL LOW (ref 6.5–8.1)

## 2016-11-20 LAB — CBC
HCT: 35 % (ref 35.0–47.0)
Hemoglobin: 11.5 g/dL — ABNORMAL LOW (ref 12.0–16.0)
MCH: 26.2 pg (ref 26.0–34.0)
MCHC: 32.8 g/dL (ref 32.0–36.0)
MCV: 80.1 fL (ref 80.0–100.0)
PLATELETS: 248 10*3/uL (ref 150–440)
RBC: 4.37 MIL/uL (ref 3.80–5.20)
RDW: 14 % (ref 11.5–14.5)
WBC: 6.2 10*3/uL (ref 3.6–11.0)

## 2016-11-20 LAB — PROTEIN / CREATININE RATIO, URINE
CREATININE, URINE: 268 mg/dL
Protein Creatinine Ratio: 0.04 mg/mg{Cre} (ref 0.00–0.15)
TOTAL PROTEIN, URINE: 10 mg/dL

## 2016-11-20 MED ORDER — GLYBURIDE 2.5 MG PO TABS: ORAL_TABLET | ORAL | 0 refills | Status: DC

## 2016-11-20 NOTE — Progress Notes (Signed)
Routine Prenatal Care Visit  Subjective  Leotis PainKeyonna S Davidson is a 26 y.o. [id number] at 4040w6d being seen today for ongoing prenatal care.  She is currently monitored for the following issues for this high-risk pregnancy and has Obesity affecting pregnancy in third trimester, antepartum; BMI 40.0-44.9, adult (HCC); History of cesarean section complicating pregnancy; Supervision of high-risk pregnancy; Abdominal cramping affecting pregnancy, antepartum; Fall; and Oral hypoglycemic controlled White classification A2 gestational diabetes mellitus (GDM) on her problem list.  ----------------------------------------------------------------------------------- Patient reports no complaints.   Contractions: Not present. Vag. Bleeding: None.  Movement: Present. Denies leaking of fluid.  Denies HA, visual changes, RUQ pain Brings BG log: nearly all values significantly elevated.  States she is taking medication (except today).  One episode where she felt hypoglycemic (her BG was 85). Resolved with eating.  ----------------------------------------------------------------------------------- The following portions of the patient's history were reviewed and updated as appropriate: allergies, current medications, past family history, past medical history, past social history, past surgical history and problem list. Problem list updated.   Objective  Blood pressure (!) 148/96, weight 286 lb (129.7 kg), last menstrual period 03/14/2016, not currently breastfeeding. Pregravid weight 251 lb (113.9 kg) Total Weight Gain 35 lb (15.9 kg) Urinalysis: Urine Protein: Negative Urine Glucose: Negative Repeat BP 148/90  Fetal Status: Fetal Heart Rate (bpm): 150   Movement: Present     General:  Alert, oriented and cooperative. Patient is in no acute distress.  Skin: Skin is warm and dry. No rash noted.   Cardiovascular: Normal heart rate noted  Respiratory: Normal respiratory effort, no problems with respiration noted   Abdomen: Soft, gravid, appropriate for gestational age. Pain/Pressure: Absent     Pelvic:  Cervical exam deferred        Extremities: Normal range of motion.     ental Status: Normal mood and affect. Normal behavior. Normal judgment and thought content.   Baseline FHR: 150 beats/min Variability: moderate Accelerations: present Decelerations: absent Tocometry: not done  Interpretation:  INDICATIONS: gestational diabetes mellitus RESULTS:  A NST procedure was performed with FHR monitoring and a normal baseline established, appropriate time of 20-40 minutes of evaluation, and accels >2 seen w 15x15 characteristics.  Results show a REACTIVE NST.     Assessment   26 y.o. [id number] at 7640w6d by  12/19/2016, by Last Menstrual Period presenting for routine prenatal visit  Plan   Pregnancy #5 Problems (from 03/14/16 to present)    Problem Noted Resolved   Oral hypoglycemic controlled White classification A2 gestational diabetes mellitus (GDM) 11/04/2016 by Vena AustriaStaebler, Andreas, MD No   Overview Addendum 11/20/2016  2:52 PM by Conard NovakJackson, Emmanual Gauthreaux D, MD    Required Referrals for A1GDM or A2GDM: [x]  Diabetes Education and Testing Supplies [x]  Nutrition Cousult [x]  start glybride 2.5 mg AM on 8/10 for elevated BG values [x]  increase glyburide 5 mg AM, 2.5mg  PM on 8/15.   Baseline and surveillance labs ([facility], refresh links as needed)  Lab Results  Component Value Date   CREATININE 0.55 08/21/2016   AST 12 (L) 08/21/2016   ALT 7 (L) 08/21/2016   PROTCRRATIO 0.05 08/21/2016   Lab Results  Component Value Date   HGBA1C 5.7 (H) 07/30/2016    Antenatal Testing Class of DM U/S NST/AFI DELIVERY  Diabetes   A1 - good control - O24.410    A2 - good control - O24.419      A2  - poor control or poor compliance - O24.419, E11.65   (Macrosomia or polyhydramnios) **E11.65  is extra code for poor control**    A2/B - O24.919  and B-C O24.319  Poor control B-C or D-R-F-T - O24.319  or   Type I DM - O24.019  20-38  20-38  20-24-28-32-36   20-24-28-32-35-38//fetal echo  20-24-27-30-33-36-38//fetal echo  40  32//2 x wk  32//2 x wk   32//2 x wk  28//BPP wkly then 32//2 x wk  40  39  PRN   39  PRN          Supervision of high-risk pregnancy 07/30/2016 by Vena Austria, MD No   Overview Addendum 11/17/2016  2:21 PM by Conard Novak, MD    [facility] Prenatal Labs  Dating LMP c/w 9wk [place] Blood type: A/Positive/-- (04/24 1044)   Quad  negative Antibody:Negative (04/24 1044)  Anatomic [place] Complete 5/29 Rubella: 1.14 (04/24 1044) Varicella: Immune  GTT Early:               Third trimester: 160 - failed 3h = GDM RPR: Non Reactive (04/24 1044)   Rhogam  HBsAg: Negative (04/24 1044)   TDaP vaccine                       Flu Shot: HIV: Non Reactive (04/24 1044)   Baby Food                                GBS:   Contraception  Pap: 05/27/16 NIL  CBB     CS/VBAC    Support Person               Obesity affecting pregnancy in third trimester, antepartum 05/16/2015 by Conard Novak, MD No   Overview Addendum 11/17/2016  2:21 PM by Conard Novak, MD    BMI >=40 [x]  early 1h gtt -  [x]  u/s for dating [ ]   [x]  nutritional goals [x]  folic acid 1mg  [ ]  bASA (>12 weeks) [x]  consider maternal EKG 1st trimester [ ]  Growth u/s 28 [ ] , 32 [ ] , 36 weeks [x - 78th%ile] [ ]  NST/AFI weekly 36+ weeks (36[] , 37[] , 38[] , 39[] , 40[] )       History of cesarean section complicating pregnancy 05/16/2015 by Conard Novak, MD No   Overview Signed 11/17/2016  2:20 PM by Conard Novak, MD    Repeat scheduled 12/13/16, unless indicated prior to this date.         Elevated Blood pressure today: to L&D for further workup.   Preterm labor symptoms and general obstetric precautions including but not limited to vaginal bleeding, contractions, leaking of fluid and fetal movement were reviewed in detail with the patient. Please refer to After Visit  Summary for other counseling recommendations.   Return in about 5 days (around 11/25/2016) for ultrasound for AFI, HROB with NST .  Thomasene Mohair, MD  11/20/2016 2:53 PM

## 2016-11-20 NOTE — Final Progress Note (Signed)
Physician Final Progress Note  Patient ID: Natasha Davidson MRN: [id number] DOB/AGE: [birth year] 26 y.o.  Admit date: 11/20/2016 Admitting provider: Will Bonnet, MD Discharge date: 11/20/2016   Admission Diagnoses: Elevated blood pressure in third trimester. IUP at 35wk6d Hx of chronic hypertension  Discharge Diagnoses:  Same as above  Consults: None  Significant Findings/ Diagnostic Studies: 26 year old G5 P3013 with EDC 12/19/2016 by LMP and confirmed with a 9 week ultrasound presents at 35wk6d from office for blood pressure evaluation. Her blood pressure in office was 148/96. Has a history of chronic hypertension. Prenatal care is also remarkable for obesity (current weight 286 lb), GDMA2 not well controlled (her glyburide was increased to 5 mgm in Am and 2.5 mgm in PM today), close interconceptual spacing, and previous Cesarean section x3 ROS negative for chest pain, headache,visual changes, vaginal bleeding or RUQ pain. Baby active Social History: single; nonsmoker, no alcohol with pregnancy; no drug use  OB History  Gravida Para Term Preterm AB Living  '5 3 3 ' 0 1 3  SAB TAB Ectopic Multiple Live Births        0 3    # Outcome Date GA Lbr Len/2nd Weight Sex Delivery Anes PTL Lv  5 Current           4 AB 10/2015          3 Term 05/16/15 [redacted]w[redacted]d 3.57 kg (7 lb 13.9 oz) M CS-LTranv Spinal  LIV  2 Term 06/29/13   3.515 kg (7 lb 12 oz) M CS-LTranv   LIV  1 Term 02/17/11   3.884 kg (8 lb 9 oz) M CS-LTranv   LIV     Complications: Fetal distress affecting delivery,Meconium in amniotic fluid     Past Medical History:  Diagnosis Date  . Anxiety   . Depression   . Hypertension    pih  . Obesity affecting pregnancy    Past Surgical History:  Procedure Laterality Date  . CESAREAN SECTION  02/17/2011 and 06/29/2013  . CESAREAN SECTION N/A 05/16/2015   Procedure: CESAREAN SECTION;  Surgeon: SWill Bonnet MD;  Location: [facility] ORS;  Service: Obstetrics;  Laterality: N/A;  .  INDUCED ABORTION    . TONSILLECTOMY     Family History  Problem Relation Age of Onset  . Hypertension Father     Exam: General: gravid BF in NAD Patient Vitals for the past 24 hrs:  BP Temp Temp src Pulse Resp SpO2 Height Weight  11/20/16 1717 123/72 - - (!) 102 - - - -  11/20/16 1702 117/70 - - (!) 103 - - - -  11/20/16 1647 123/72 - - (!) 110 - - - -  11/20/16 1637 121/77 98.7 F (37.1 C) Oral (!) 110 18 100 % '5\' 4"'  (1.626 m) 129.7 kg (286 lb)   Heart: RRR without murmur Lungs:CTAB FHR: 145-150 baseline with accelerations to 170, moderate variability, no decelerations Toco: rare contractions Extremities: +1 to +2 nonpitting edema of LE Neuro: alert, oriented x3, normal speech Psych: normal affect and mood  Results for orders placed or performed during the hospital encounter of 11/20/16 (from the past 24 hour(s))  Protein / creatinine ratio, urine     Status: None   Collection Time: 11/20/16  4:37 PM  Result Value Ref Range   Creatinine, Urine 268 mg/dL   Total Protein, Urine 10 mg/dL   Protein Creatinine Ratio 0.04 0.00 - 0.15 mg/mg[Cre]  CBC     Status: Abnormal  Collection Time: 11/20/16  4:47 PM  Result Value Ref Range   WBC 6.2 3.6 - 11.0 K/uL   RBC 4.37 3.80 - 5.20 MIL/uL   Hemoglobin 11.5 (L) 12.0 - 16.0 g/dL   HCT 35.0 35.0 - 47.0 %   MCV 80.1 80.0 - 100.0 fL   MCH 26.2 26.0 - 34.0 pg   MCHC 32.8 32.0 - 36.0 g/dL   RDW 14.0 11.5 - 14.5 %   Platelets 248 150 - 440 K/uL  Comprehensive metabolic panel     Status: Abnormal   Collection Time: 11/20/16  4:47 PM  Result Value Ref Range   Sodium 137 135 - 145 mmol/L   Potassium 3.6 3.5 - 5.1 mmol/L   Chloride 107 101 - 111 mmol/L   CO2 22 22 - 32 mmol/L   Glucose, Bld 109 (H) 65 - 99 mg/dL   BUN 6 6 - 20 mg/dL   Creatinine, Ser 0.63 0.44 - 1.00 mg/dL   Calcium 8.7 (L) 8.9 - 10.3 mg/dL   Total Protein 6.4 (L) 6.5 - 8.1 g/dL   Albumin 2.6 (L) 3.5 - 5.0 g/dL   AST 17 15 - 41 U/L   ALT 11 (L) 14 - 54 U/L    Alkaline Phosphatase 131 (H) 38 - 126 U/L   Total Bilirubin 0.2 (L) 0.3 - 1.2 mg/dL   GFR calc non Af Amer >60 >60 mL/min   GFR calc Af Amer >60 >60 mL/min   Anion gap 8 5 - 15   A: IUP at 35wk 6days with no evidence of preeclampsia History of chronic hypertension  Normotensive in L&D Cat 1 tracing P: Discharge home with preeclampsia precautions FU for NST, [place], BP check and blood sugar on Monday 20 August as scheduled  Procedures: none  Discharge Condition: stable  Disposition: 01-Home or Self Care  Diet: Calorie restricted diabetic diet  Discharge Activity: Activity as tolerated  Discharge Instructions    Discharge patient    Complete by:  As directed    Discharge disposition:  01-Home or Self Care   Discharge patient date:  11/20/2016     Allergies as of 11/20/2016   No Known Allergies     Medication List    TAKE these medications   ACCU-CHEK FASTCLIX LANCETS Misc 1 Units by Percutaneous route 4 (four) times daily.   ACCU-CHEK NANO SMARTVIEW w/Device Kit 1 kit by Subdermal route as directed. Check blood sugars for fasting, and two hours after breakfast, lunch and dinner (4 checks daily)   glucose blood test strip Commonly known as:  ACCU-CHEK SMARTVIEW Use as instructed to check blood sugars   glyBURIDE 2.5 MG tablet Commonly known as:  DIABETA Take 5 mgm (2 tablets) in AM and 2.5 mgm (1 tablet in PM) What changed:  how much to take  how to take this  when to take this  additional instructions        Total time spent taking care of this patient:15-20 minutes  Signed: Dalia Heading 11/20/2016, 5:52 PM

## 2016-11-20 NOTE — Patient Instructions (Signed)
Glyburide Take 5 mg (2 tablets) in the morning Take 2.5 mg (1 tablet) in the evening

## 2016-11-20 NOTE — OB Triage Note (Signed)
Pt presents from dr office for pih workup. Pt says she has had "epigastric pain, headaches and seeing spots, but not happening right at this moment.". Denies any NVD, bleeding or LOF. Reports positive fetal movement. 1+edema, 2+reflexes, lungs clear, no clonus. Vitals WNL. BP cycling. Will continue to monitor.

## 2016-11-21 ENCOUNTER — Telehealth: Payer: Self-pay

## 2016-11-21 NOTE — Telephone Encounter (Signed)
Patient seen in office yesterday and sent to L&D for monitoring due to elevated BP. Pt c/o legs being more swollen today and feeling very uncomfortable, difficulty walking and pelvic pressure. Pt has to work at 2:30 and doesn't feel as if she can complete an 8 hour shift on her feet. Pt wanted to be seen but advised no availability in office today. Pt states she tried to elevate her feet but that did not help with swelling and BP earlier today was 140/86. Pt wants to know if she can be taken out of work due to discomfort. Please advise. Cb# [phone]

## 2016-11-21 NOTE — Telephone Encounter (Signed)
Please advise if it is okay to go ahead and take pt out of work

## 2016-11-21 NOTE — Telephone Encounter (Signed)
Writing letter for pt. Leaving it up front for her. Pt aware via vm

## 2016-11-25 ENCOUNTER — Ambulatory Visit (INDEPENDENT_AMBULATORY_CARE_PROVIDER_SITE_OTHER): Payer: Medicaid Other

## 2016-11-25 ENCOUNTER — Telehealth: Payer: Self-pay

## 2016-11-25 ENCOUNTER — Encounter: Payer: Self-pay | Admitting: Obstetrics and Gynecology

## 2016-11-25 ENCOUNTER — Ambulatory Visit (INDEPENDENT_AMBULATORY_CARE_PROVIDER_SITE_OTHER): Payer: Medicaid Other | Admitting: Obstetrics and Gynecology

## 2016-11-25 VITALS — BP 142/92 | Wt 288.0 lb

## 2016-11-25 DIAGNOSIS — O99213 Obesity complicating pregnancy, third trimester: Secondary | ICD-10-CM

## 2016-11-25 DIAGNOSIS — O24415 Gestational diabetes mellitus in pregnancy, controlled by oral hypoglycemic drugs: Secondary | ICD-10-CM

## 2016-11-25 DIAGNOSIS — O0993 Supervision of high risk pregnancy, unspecified, third trimester: Secondary | ICD-10-CM | POA: Diagnosis not present

## 2016-11-25 DIAGNOSIS — O403XX0 Polyhydramnios, third trimester, not applicable or unspecified: Secondary | ICD-10-CM

## 2016-11-25 DIAGNOSIS — Z6841 Body Mass Index (BMI) 40.0 and over, adult: Secondary | ICD-10-CM

## 2016-11-25 DIAGNOSIS — O34219 Maternal care for unspecified type scar from previous cesarean delivery: Secondary | ICD-10-CM

## 2016-11-25 DIAGNOSIS — Z8679 Personal history of other diseases of the circulatory system: Secondary | ICD-10-CM

## 2016-11-25 DIAGNOSIS — Z3685 Encounter for antenatal screening for Streptococcus B: Secondary | ICD-10-CM

## 2016-11-25 DIAGNOSIS — O163 Unspecified maternal hypertension, third trimester: Secondary | ICD-10-CM

## 2016-11-25 DIAGNOSIS — Z113 Encounter for screening for infections with a predominantly sexual mode of transmission: Secondary | ICD-10-CM

## 2016-11-25 NOTE — Telephone Encounter (Signed)
Pt called after hour nurse c/o cramping, ctxs, abd/back pain or pressure.  Was seen in ER last wk but cx was not dilated.   C/o lower abd and vag area hurts when she sits or gets up.  Was adv by after hour nurse to go to L&D but there are no notes in Epic.  Pt does however have appt in office today.

## 2016-11-25 NOTE — Telephone Encounter (Signed)
Appointment w/[facility] today

## 2016-11-25 NOTE — Progress Notes (Signed)
Routine Prenatal Care Visit  Subjective  Natasha Davidson is a 25 y.o. [phone] at [redacted]w[redacted]d being seen today for ongoing prenatal care.  She is currently monitored for the following issues for this high-risk pregnancy and has Obesity affecting pregnancy in third trimester, antepartum; BMI 40.0-44.9, adult (HCC); History of cesarean section complicating pregnancy; Supervision of high-risk pregnancy; Abdominal cramping affecting pregnancy, antepartum; Fall; Oral hypoglycemic controlled White classification A2 gestational diabetes mellitus (GDM); Elevated blood pressure affecting pregnancy in third trimester, antepartum; History of chronic hypertension; and Polyhydramnios in third trimester on her problem list.  ----------------------------------------------------------------------------------- Patient reports no complaints.  Specifically denies HA, vision changes, RUQ or epigastric pain, increased edema  Contractions: Not present. Vag. Bleeding: None.  Movement: Present. Denies leaking of fluid.  Fasting improved, most post meals around 120 two 140 and one post lunch 178 ----------------------------------------------------------------------------------- The following portions of the patient's history were reviewed and updated as appropriate: allergies, current medications, past family history, past medical history, past social history, past surgical history and problem list. Problem list updated.   Objective  Blood pressure (!) 142/92, weight 288 lb (130.6 kg), last menstrual period 03/14/2016, not currently breastfeeding. Pregravid weight 251 lb (113.9 kg) Total Weight Gain 37 lb (16.8 kg) 2lbs weight gain in last 5 days Urinalysis:      Fetal Status: Fetal Heart Rate (bpm): 150   Movement: Present     General:  Alert, oriented and cooperative. Patient is in no acute distress.  Skin: Skin is warm and dry. No rash noted.   Cardiovascular: Normal heart rate noted  Respiratory: Normal respiratory  effort, no problems with respiration noted  Abdomen: Soft, gravid, appropriate for gestational age. Pain/Pressure: Absent     Pelvic:  Cervical exam performed        Extremities: Normal range of motion.     ental Status: Normal mood and affect. Normal behavior. Normal judgment and thought content.    Baseline: 150 Variability: moderate Accelerations: present Decelerations: absent Tocometry: N/A The patient was monitored for 30 minutes, fetal heart rate tracing was deemed reactive, category I tracing,   AFI 30cm Assessment   26 y.o. [id number] at [redacted]w[redacted]d by  12/19/2016, by Last Menstrual Period presenting for routine prenatal visit  Plan   Pregnancy #5 Problems (from 03/14/16 to present)    Problem Noted Resolved   Elevated blood pressure affecting pregnancy in third trimester, antepartum 11/20/2016 by Conard Novak, MD No   Oral hypoglycemic controlled White classification A2 gestational diabetes mellitus (GDM) 11/04/2016 by Vena Austria, MD No   Overview Addendum 11/20/2016  2:52 PM by Conard Novak, MD    Required Referrals for A1GDM or A2GDM: [x]  Diabetes Education and Testing Supplies [x]  Nutrition Cousult [x]  start glybride 2.5 mg AM on 8/10 for elevated BG values [x]  increase glyburide 5 mg AM, 2.5mg  PM on 8/15.   Baseline and surveillance labs (pulled in from [facility], refresh links as needed)  Lab Results  Component Value Date   CREATININE 0.55 08/21/2016   AST 12 (L) 08/21/2016   ALT 7 (L) 08/21/2016   PROTCRRATIO 0.05 08/21/2016   Lab Results  Component Value Date   HGBA1C 5.7 (H) 07/30/2016    Antenatal Testing Class of DM U/S NST/AFI DELIVERY  Diabetes   A1 - good control - O24.410    A2 - good control - O24.419      A2  - poor control or poor compliance - O24.419, E11.65   (Macrosomia or polyhydramnios) **E11.65  is extra code for poor control**    A2/B - O24.919  and B-C O24.319  Poor control B-C or D-R-F-T - O24.319  or  Type I DM - O24.019    20-38  20-38  20-24-28-32-36   20-24-28-32-35-38//fetal echo  20-24-27-30-33-36-38//fetal echo  40  32//2 x wk  32//2 x wk   32//2 x wk  28//BPP wkly then 32//2 x wk  40  39  PRN   39  PRN          Supervision of high-risk pregnancy 07/30/2016 by Vena Austria, MD No   Overview Addendum 11/17/2016  2:21 PM by Conard Novak, MD    [facility] Prenatal Labs  Dating LMP c/w 9wk [place] Blood type: A/Positive/-- (04/24 1044)   Quad  negative Antibody:Negative (04/24 1044)  Anatomic [place] Complete 5/29 Rubella: 1.14 (04/24 1044) Varicella: Immune  GTT Early:               Third trimester: 160 - failed 3h = GDM RPR: Non Reactive (04/24 1044)   Rhogam  HBsAg: Negative (04/24 1044)   TDaP vaccine                       Flu Shot: HIV: Non Reactive (04/24 1044)   Baby Food                                GBS:   Contraception  Pap: 05/27/16 NIL  CBB     CS/VBAC    Support Person               Obesity affecting pregnancy in third trimester, antepartum 05/16/2015 by Conard Novak, MD No   Overview Addendum 11/17/2016  2:21 PM by Conard Novak, MD    BMI >=40 [x]  early 1h gtt -  [x]  u/s for dating [ ]   [x]  nutritional goals [x]  folic acid 1mg  [ ]  bASA (>12 weeks) [x]  consider maternal EKG 1st trimester [ ]  Growth u/s 28 [ ] , 32 [ ] , 36 weeks [x - 78th%ile] [ ]  NST/AFI weekly 36+ weeks (36[] , 37[] , 38[] , 39[] , 40[] )       History of cesarean section complicating pregnancy 05/16/2015 by Conard Novak, MD No   Overview Signed 11/17/2016  2:20 PM by Conard Novak, MD    Repeat scheduled 12/13/16, unless indicated prior to this date.          Term labor symptoms and general obstetric precautions including but not limited to vaginal bleeding, contractions, leaking of fluid and fetal movement were reviewed in detail with the patient. Please refer to After Visit Summary for other counseling recommendations.   - Currently on glyburide 5mg  qAM  2.5mg  qPM only been taking for about 3 days, some improvement but if still elevated consider increasing AM dose to 7.5mg  - Polyhydramnios labor precautions reviewed, likely secondary to GDM but TEF/EA in differential - BP remain mild range, son currently in hospital was taken from day care via EMS for asthma exacerbation just prior to visit - continue twice weekly testing given constellation of GDMA2, polyhydramnios, and CHTN  Return in about 4 days (around 11/29/2016) for 3-4 NST, 7 days NST/AFI.

## 2016-11-25 NOTE — Progress Notes (Signed)
AFI/NST 

## 2016-11-27 LAB — GC/CHLAMYDIA PROBE AMP
CHLAMYDIA, DNA PROBE: NEGATIVE
NEISSERIA GONORRHOEAE BY PCR: NEGATIVE

## 2016-11-27 LAB — STREP GP B NAA: Strep Gp B NAA: NEGATIVE

## 2016-11-29 ENCOUNTER — Ambulatory Visit (INDEPENDENT_AMBULATORY_CARE_PROVIDER_SITE_OTHER): Payer: Medicaid Other | Admitting: Maternal Newborn

## 2016-11-29 ENCOUNTER — Ambulatory Visit (INDEPENDENT_AMBULATORY_CARE_PROVIDER_SITE_OTHER): Payer: Medicaid Other

## 2016-11-29 VITALS — BP 140/80 | Wt 285.0 lb

## 2016-11-29 DIAGNOSIS — O99213 Obesity complicating pregnancy, third trimester: Secondary | ICD-10-CM | POA: Diagnosis not present

## 2016-11-29 DIAGNOSIS — O403XX0 Polyhydramnios, third trimester, not applicable or unspecified: Secondary | ICD-10-CM

## 2016-11-29 DIAGNOSIS — O24415 Gestational diabetes mellitus in pregnancy, controlled by oral hypoglycemic drugs: Secondary | ICD-10-CM

## 2016-11-29 DIAGNOSIS — Z3A37 37 weeks gestation of pregnancy: Secondary | ICD-10-CM | POA: Diagnosis not present

## 2016-11-29 DIAGNOSIS — O0993 Supervision of high risk pregnancy, unspecified, third trimester: Secondary | ICD-10-CM

## 2016-11-29 DIAGNOSIS — Z6841 Body Mass Index (BMI) 40.0 and over, adult: Secondary | ICD-10-CM | POA: Diagnosis not present

## 2016-11-29 DIAGNOSIS — Z362 Encounter for other antenatal screening follow-up: Secondary | ICD-10-CM | POA: Diagnosis not present

## 2016-11-29 DIAGNOSIS — Z8679 Personal history of other diseases of the circulatory system: Secondary | ICD-10-CM

## 2016-11-29 DIAGNOSIS — O34219 Maternal care for unspecified type scar from previous cesarean delivery: Secondary | ICD-10-CM

## 2016-11-29 MED ORDER — DOCUSATE SODIUM 100 MG PO CAPS: 100.0000 mg | ORAL_CAPSULE | Freq: Two times a day (BID) | ORAL | 2 refills | Status: DC | PRN

## 2016-11-29 NOTE — Progress Notes (Signed)
Routine Prenatal Care Visit  Subjective  Natasha Davidson is a 26 y.o. [phone] at [redacted]w[redacted]d being seen today for ongoing prenatal care.  She is currently monitored for the following issues for this high-risk pregnancy and has Obesity affecting pregnancy in third trimester, antepartum; BMI 40.0-44.9, adult (HCC); History of cesarean section complicating pregnancy; Supervision of high-risk pregnancy; Oral hypoglycemic controlled White classification A2 gestational diabetes mellitus (GDM); History of chronic hypertension; and Polyhydramnios in third trimester on her problem list.  ----------------------------------------------------------------------------------- Patient reports cervical pain/twinges. Denies headache, increased edema, vision changes, epigastric pain. Contractions: Irritability. Vag. Bleeding: None.  Movement: Present. Denies leaking of fluid. Glucose log: Some missed readings, but several fasting and post-prandial values elevated (one at 211). ----------------------------------------------------------------------------------- The following portions of the patient's history were reviewed and updated as appropriate: allergies, current medications, past family history, past medical history, past social history, past surgical history and problem list. Problem list updated.   Objective  Blood pressure 140/80, weight 285 lb (129.3 kg), last menstrual period 03/14/2016, unknown if currently breastfeeding. Pregravid weight 251 lb (113.9 kg) Total Weight Gain 34 lb (15.4 kg) Urinalysis: Urine Protein: Negative Urine Glucose: Negative  Fetal Status: Fetal Heart Rate (bpm): 150   Movement: Present     General:  Alert, oriented and cooperative. Patient is in no acute distress.  Skin: Skin is warm and dry. No rash noted.   Cardiovascular: Normal heart rate noted  Respiratory: Normal respiratory effort, no problems with respiration noted  Abdomen: Soft, gravid, appropriate for gestational age.  Pain/Pressure: Present     Pelvic:  Cervical exam performed       Closed/thick/high  Extremities: Normal range of motion.     Mental Status: Normal mood and affect. Normal behavior. Normal judgment and thought content.   Baseline: 140 Variability: moderate Accelerations: present Decelerations: absent Tocometry: N/A The patient was monitored for 30 minutes, fetal heart rate tracing was deemed reactive, category I tracing.  AFI = 25 today  Assessment   26 y.o. [id number] at [redacted]w[redacted]d by  12/19/2016, by Last Menstrual Period presenting for routine prenatal visit  Plan   Pregnancy #5 Problems (from 03/14/16 to 11/25/16)    Problem Noted Resolved   Polyhydramnios in third trimester 11/25/2016 by Vena Austria, MD No   Overview Signed 11/25/2016  1:56 PM by Vena Austria, MD    AFI 30 on 11/25/16      Oral hypoglycemic controlled White classification A2 gestational diabetes mellitus (GDM) 11/04/2016 by Vena Austria, MD No   Overview Addendum 11/20/2016  2:52 PM by Conard Novak, MD    Required Referrals for A1GDM or A2GDM: [x]  Diabetes Education and Testing Supplies [x]  Nutrition Cousult [x]  start glybride 2.5 mg AM on 8/10 for elevated BG values [x]  increase glyburide 5 mg AM, 2.5mg  PM on 8/15.   Baseline and surveillance labs (pulled in from [facility], refresh links as needed)  Lab Results  Component Value Date   CREATININE 0.55 08/21/2016   AST 12 (L) 08/21/2016   ALT 7 (L) 08/21/2016   PROTCRRATIO 0.05 08/21/2016   Lab Results  Component Value Date   HGBA1C 5.7 (H) 07/30/2016    Antenatal Testing Class of DM U/S NST/AFI DELIVERY  Diabetes   A1 - good control - O24.410    A2 - good control - O24.419      A2  - poor control or poor compliance - O24.419, E11.65   (Macrosomia or polyhydramnios) **E11.65 is extra code for poor control**  A2/B - O24.919  and B-C O24.319  Poor control B-C or D-R-F-T - O24.319  or  Type I DM - O24.019   20-38  20-38  20-24-28-32-36   20-24-28-32-35-38//fetal echo  20-24-27-30-33-36-38//fetal echo  40  32//2 x wk  32//2 x wk   32//2 x wk  28//BPP wkly then 32//2 x wk  40  39  PRN   39  PRN          Supervision of high-risk pregnancy 07/30/2016 by Vena Austria, MD No   Overview Addendum 11/27/2016 11:43 AM by Vena Austria, MD    [facility] Prenatal Labs  Dating LMP c/w 9wk [place] Blood type: A/Positive/-- (04/24 1044)   Quad  negative Antibody:Negative (04/24 1044)  Anatomic [place] Complete 5/29 Rubella: 1.14 (04/24 1044) Varicella: Immune  GTT Early:               Third trimester: 160 - failed 3h = GDM RPR: Non Reactive (04/24 1044)   Rhogam N/A HBsAg: Negative (04/24 1044)   TDaP vaccine NEEDS               Flu Shot: HIV: Non Reactive (04/24 1044)   Baby Food                                [id number] 11/25/16  Contraception  Pap: 05/27/16 NIL  CBB     CS/VBAC    Support Person               Obesity affecting pregnancy in third trimester, antepartum 05/16/2015 by Conard Novak, MD No   Overview Addendum 11/25/2016  9:04 PM by Vena Austria, MD    BMI >=40 [x]  early 1h gtt -  [x]  u/s for dating [ ]   [x]  nutritional goals [x]  folic acid 1mg  [ ]  bASA (>12 weeks) [x]  consider maternal EKG 1st trimester [ ]  Growth u/s 28 [X] , 32 [X] , 36 weeks [x - 78th%ile] [ ]  NST/AFI weekly 36+ weeks (36[X] , 37[X] , 38[] , 39[] , 40[] )       History of cesarean section complicating pregnancy 05/16/2015 by Conard Novak, MD No   Overview Signed 11/17/2016  2:20 PM by Conard Novak, MD    Repeat scheduled 12/13/16, unless indicated prior to this date.      Elevated blood pressure affecting pregnancy in third trimester, antepartum 11/20/2016 by Conard Novak, MD 11/25/2016 by Vena Austria, MD       Term labor symptoms and general obstetric precautions including but not limited to vaginal bleeding, contractions, leaking of fluid and fetal  movement were reviewed in detail with the patient. Please refer to After Visit Summary for other counseling recommendations.   Increased morning dose of glyburide to 7.5 mg per Dr. Bonney Aid due to elevated values in log. Continue evening dose at 2.5 mg.  Continue twice weeky testing in setting of A2GDM, cHTN, obesity, and polyhydramnios.  Return in about 3 days (around 12/02/2016) for NST and ROB.   Marcelyn Bruins, CNM

## 2016-12-02 ENCOUNTER — Ambulatory Visit (INDEPENDENT_AMBULATORY_CARE_PROVIDER_SITE_OTHER): Payer: Medicaid Other | Admitting: Obstetrics & Gynecology

## 2016-12-02 ENCOUNTER — Other Ambulatory Visit: Payer: Self-pay

## 2016-12-02 ENCOUNTER — Other Ambulatory Visit: Payer: Medicaid Other

## 2016-12-02 ENCOUNTER — Encounter: Payer: Self-pay | Admitting: Obstetrics & Gynecology

## 2016-12-02 ENCOUNTER — Inpatient Hospital Stay
Admission: EM | Admit: 2016-12-02 | Discharge: 2016-12-04 | DRG: 765 | Disposition: A | Discharge status: Home / Self Care | Payer: Medicaid Other | Attending: Advanced Practice Midwife | Admitting: Advanced Practice Midwife

## 2016-12-02 ENCOUNTER — Encounter: Payer: Medicaid Other | Admitting: Maternal Newborn

## 2016-12-02 ENCOUNTER — Encounter: Payer: Medicaid Other | Admitting: Obstetrics & Gynecology

## 2016-12-02 VITALS — BP 132/84 | HR 107 | Wt 285.0 lb

## 2016-12-02 DIAGNOSIS — O0993 Supervision of high risk pregnancy, unspecified, third trimester: Secondary | ICD-10-CM

## 2016-12-02 DIAGNOSIS — Z6841 Body Mass Index (BMI) 40.0 and over, adult: Secondary | ICD-10-CM | POA: Diagnosis not present

## 2016-12-02 DIAGNOSIS — Z302 Encounter for sterilization: Secondary | ICD-10-CM | POA: Diagnosis not present

## 2016-12-02 DIAGNOSIS — O24425 Gestational diabetes mellitus in childbirth, controlled by oral hypoglycemic drugs: Secondary | ICD-10-CM | POA: Diagnosis present

## 2016-12-02 DIAGNOSIS — Z3A37 37 weeks gestation of pregnancy: Secondary | ICD-10-CM

## 2016-12-02 DIAGNOSIS — O34219 Maternal care for unspecified type scar from previous cesarean delivery: Secondary | ICD-10-CM | POA: Diagnosis not present

## 2016-12-02 DIAGNOSIS — E669 Obesity, unspecified: Secondary | ICD-10-CM | POA: Diagnosis present

## 2016-12-02 DIAGNOSIS — O24415 Gestational diabetes mellitus in pregnancy, controlled by oral hypoglycemic drugs: Secondary | ICD-10-CM

## 2016-12-02 DIAGNOSIS — O24419 Gestational diabetes mellitus in pregnancy, unspecified control: Secondary | ICD-10-CM | POA: Diagnosis present

## 2016-12-02 DIAGNOSIS — O1002 Pre-existing essential hypertension complicating childbirth: Secondary | ICD-10-CM | POA: Diagnosis present

## 2016-12-02 DIAGNOSIS — O403XX0 Polyhydramnios, third trimester, not applicable or unspecified: Secondary | ICD-10-CM

## 2016-12-02 DIAGNOSIS — O34211 Maternal care for low transverse scar from previous cesarean delivery: Secondary | ICD-10-CM | POA: Diagnosis present

## 2016-12-02 DIAGNOSIS — O288 Other abnormal findings on antenatal screening of mother: Secondary | ICD-10-CM | POA: Diagnosis present

## 2016-12-02 DIAGNOSIS — O99214 Obesity complicating childbirth: Secondary | ICD-10-CM | POA: Diagnosis present

## 2016-12-02 DIAGNOSIS — O99213 Obesity complicating pregnancy, third trimester: Secondary | ICD-10-CM

## 2016-12-02 DIAGNOSIS — O0990 Supervision of high risk pregnancy, unspecified, unspecified trimester: Secondary | ICD-10-CM

## 2016-12-02 DIAGNOSIS — Z8679 Personal history of other diseases of the circulatory system: Secondary | ICD-10-CM

## 2016-12-02 HISTORY — DX: Type 2 diabetes mellitus without complications: E11.9

## 2016-12-02 HISTORY — DX: Gestational diabetes mellitus in pregnancy, unspecified control: O24.419

## 2016-12-02 LAB — COMPREHENSIVE METABOLIC PANEL
ALK PHOS: 155 U/L — AB (ref 38–126)
ALT: 11 U/L — AB (ref 14–54)
AST: 17 U/L (ref 15–41)
Albumin: 2.6 g/dL — ABNORMAL LOW (ref 3.5–5.0)
Anion gap: 8 (ref 5–15)
BILIRUBIN TOTAL: 0.2 mg/dL — AB (ref 0.3–1.2)
BUN: 7 mg/dL (ref 6–20)
CALCIUM: 9 mg/dL (ref 8.9–10.3)
CO2: 19 mmol/L — AB (ref 22–32)
CREATININE: 0.59 mg/dL (ref 0.44–1.00)
Chloride: 111 mmol/L (ref 101–111)
Glucose, Bld: 132 mg/dL — ABNORMAL HIGH (ref 65–99)
Potassium: 3.6 mmol/L (ref 3.5–5.1)
Sodium: 138 mmol/L (ref 135–145)
TOTAL PROTEIN: 6.5 g/dL (ref 6.5–8.1)

## 2016-12-02 LAB — URINALYSIS, ROUTINE W REFLEX MICROSCOPIC
Bilirubin Urine: NEGATIVE
Glucose, UA: >=500 mg/dL — AB
Hgb urine dipstick: NEGATIVE
KETONES UR: NEGATIVE mg/dL
NITRITE: NEGATIVE
PROTEIN: NEGATIVE mg/dL
RBC / HPF: NONE SEEN RBC/hpf (ref 0–5)
Specific Gravity, Urine: 1.025 (ref 1.005–1.030)
pH: 5 (ref 5.0–8.0)

## 2016-12-02 LAB — GLUCOSE, CAPILLARY
GLUCOSE-CAPILLARY: 163 mg/dL — AB (ref 65–99)
GLUCOSE-CAPILLARY: 80 mg/dL (ref 65–99)
GLUCOSE-CAPILLARY: 108 mg/dL — AB (ref 65–99)
Glucose-Capillary: 129 mg/dL — ABNORMAL HIGH (ref 65–99)

## 2016-12-02 LAB — CBC
HEMATOCRIT: 34.9 % — AB (ref 35.0–47.0)
HEMOGLOBIN: 11.8 g/dL — AB (ref 12.0–16.0)
MCH: 26.7 pg (ref 26.0–34.0)
MCHC: 33.8 g/dL (ref 32.0–36.0)
MCV: 79.1 fL — ABNORMAL LOW (ref 80.0–100.0)
Platelets: 262 10*3/uL (ref 150–440)
RBC: 4.41 MIL/uL (ref 3.80–5.20)
RDW: 14.6 % — ABNORMAL HIGH (ref 11.5–14.5)
WBC: 6.8 10*3/uL (ref 3.6–11.0)

## 2016-12-02 LAB — TYPE AND SCREEN
ABO/RH(D): A POS
Antibody Screen: NEGATIVE

## 2016-12-02 MED ORDER — SODIUM CHLORIDE 0.9 % IV SOLN: INTRAVENOUS | Status: DC

## 2016-12-02 MED ORDER — INSULIN ASPART 100 UNIT/ML ~~LOC~~ SOLN
0.0000 [IU] | Freq: Four times a day (QID) | SUBCUTANEOUS | Status: DC
  Administered 2016-12-04 (×2): 3 [IU] via SUBCUTANEOUS
  Filled 2016-12-02 (×2): qty 1

## 2016-12-02 MED ORDER — INSULIN ASPART 100 UNIT/ML ~~LOC~~ SOLN: 0.0000 [IU] | Freq: Four times a day (QID) | SUBCUTANEOUS | Status: DC

## 2016-12-02 MED ORDER — LIDOCAINE HCL (PF) 1 % IJ SOLN: 30.0000 mL | INTRAMUSCULAR | Status: DC | PRN

## 2016-12-02 MED ORDER — METFORMIN HCL 500 MG PO TABS: 500.0000 mg | ORAL_TABLET | Freq: Two times a day (BID) | ORAL | 1 refills | Status: DC

## 2016-12-02 MED ORDER — LACTATED RINGERS IV SOLN
500.0000 mL | INTRAVENOUS | Status: DC | PRN
  Administered 2016-12-02: 1000 mL via INTRAVENOUS
  Administered 2016-12-03: 13:00:00 via INTRAVENOUS

## 2016-12-02 MED ORDER — INSULIN ASPART 100 UNIT/ML ~~LOC~~ SOLN
0.0000 [IU] | SUBCUTANEOUS | Status: DC
  Administered 2016-12-02: 2 [IU] via SUBCUTANEOUS
  Filled 2016-12-02: qty 1

## 2016-12-02 MED ORDER — ONDANSETRON HCL 4 MG/2ML IJ SOLN
4.0000 mg | Freq: Four times a day (QID) | INTRAMUSCULAR | Status: DC | PRN
Start: 2016-12-02 — End: 2016-12-03
  Administered 2016-12-03: 4 mg via INTRAVENOUS

## 2016-12-02 MED ORDER — SOD CITRATE-CITRIC ACID 500-334 MG/5ML PO SOLN
30.0000 mL | ORAL | Status: AC
  Administered 2016-12-03: 30 mL via ORAL
  Filled 2016-12-02: qty 15

## 2016-12-02 MED ORDER — ZOLPIDEM TARTRATE 5 MG PO TABS
5.0000 mg | ORAL_TABLET | Freq: Every evening | ORAL | Status: DC | PRN
  Administered 2016-12-02: 5 mg via ORAL
  Filled 2016-12-02: qty 1

## 2016-12-02 NOTE — OB Triage Note (Signed)
Pt sent from office for non-reactive stress test. Denies any pain, bleeding, or LOF. Reports positive fetal movement. Vitals WNL. Will continue to monitor

## 2016-12-02 NOTE — H&P (Signed)
History and Physicial   Chief Complaint: Sent from office for extended monitoring due to non reactive NST.   History of Present Illness: 26 year old G5 P3013 with EDC 12/19/2016 by LMP and confirmed with a 9 week ultrasound presents at 37wk4d from office for extended monitoring due to a non reactive non stress test in office. In L&D the tracing was reactive, but baby mildly tachycardic with baseline in the 160s, with acceerations to 180s with moderate variability.  Prenatal care is  remarkable for obesity (current weight 285 lb), uncontrolled GDMA2 (her glyburide 5 mgm in Am and 2.5 mgm in PM -could not tolerate further increases and was given a RX to also start metformin today), close interconceptual spacing, and previous Cesarean section x3. Patient is not compliant with diet and this AM had biscuit, pancake and bacon for breakfast. Her FSBS 3 hours pp is 163 now. EFW on 8/10 was in the 78th% with AC>97th%. She also has polyhydraminos with the last two AFIs=30 and 26 cm. Repeat CS currently scheduled for 12/13/2016 at 39 weeks ROS negative for chest pain, headache,visual changes, vaginal bleeding or RUQ pain. Baby active Social History: single; nonsmoker, no alcohol with pregnancy; no drug use                  OB History  Gravida Para Term Preterm AB Living  5 3 3  0 1 3  SAB TAB Ectopic Multiple Live Births        0 3    # Outcome Date GA Lbr Len/2nd Weight Sex Delivery Anes PTL Lv  5 Current           4 AB 10/2015          3 Term 05/16/15 [redacted]w[redacted]d  3.57 kg (7 lb 13.9 oz) M CS-LTranv Spinal  LIV  2 Term 06/29/13   3.515 kg (7 lb 12 oz) M CS-LTranv   LIV  1 Term 02/17/11   3.884 kg (8 lb 9 oz) M CS-LTranv   LIV     Complications: Fetal distress affecting delivery,Meconium in amniotic fluid         Past Medical History:  Diagnosis Date  . Anxiety   . Depression   . Hypertension    pih  . Obesity affecting pregnancy         Past Surgical History:   Procedure Laterality Date  . CESAREAN SECTION  02/17/2011 and 06/29/2013  . CESAREAN SECTION N/A 05/16/2015   Procedure: CESAREAN SECTION;  Surgeon: Conard Novak, MD;  Location: [facility] ORS;  Service: Obstetrics;  Laterality: N/A;  . INDUCED ABORTION    . TONSILLECTOMY          Family History  Problem Relation Age of Onset  . Hypertension Father     Exam: General: gravid BF in NAD Patient Vitals for the past 24 hrs: BP 128/79 (BP Location: Left Arm)   Pulse (!) 116   Temp 98.9 F (37.2 C) (Oral)   Resp 18   Ht 5\' 4"  (1.626 m)   Wt 129.3 kg (285 lb)   LMP 03/14/2016 (Exact Date)   SpO2 100%   BMI 48.92 kg/m   Thyroid: not enlarged Heart: RRR without murmur Lungs:CTAB Abdomen: gravid, NT, obese FHR: 160s with accelerations and moderate variability, no repetitive decelerations Toco: irregular mild contractions/ irritability Extremities: +1 to +2 nonpitting edema of LE, DTRs +2 Neuro: alert, oriented x3, normal speech Psych: normal affect and mood, was tearful when discussing stress she is  under and her non compliant diet.   A: IUP at 37wk4days with uncontrolled GDMA2 and fetal tachycardia Hyperglycemia, R/O DKA  P: Consulted Dr Jean Rosenthal who desires consult from MFM regarding POM: early delivery vs starting insulin. Urine sent for ketones-ketones negative  Farrel Conners, CNM

## 2016-12-02 NOTE — Progress Notes (Signed)
L&D Progress Note   S: I felt shaky and sweaty there for a little while, but I am OK now.  O: BP 125/84   Pulse (!) 113   Temp 99 F (37.2 C) (Oral)   Resp 18   Ht 5\' 4"  (1.626 m)   Wt 129.3 kg (285 lb)   LMP 03/14/2016 (Exact Date)   SpO2 100%   BMI 48.92 kg/m    General: in NAD, resting comfortably now. Had some hypoglycemic symptoms after eating lunch. Blood sugar dropped from 132 to 80 after receiving 4 U of regular insulin for BS elevation. Dr Leone Brand from [facility] up to evaluate patient and he recommended preceding with delivery due to fetopathy in the next few days if FHR strip remains reassuring.  FHR: 150s to 160 baseline with accelerations to 170s to 180, occasional variable deceleration with quick return to baseline Toco: irregular contractions  Results for orders placed or performed during the hospital encounter of 12/02/16 (from the past 24 hour(s))  Glucose, capillary     Status: Abnormal   Collection Time: 12/02/16 11:29 AM  Result Value Ref Range   Glucose-Capillary 163 (H) 65 - 99 mg/dL   Comment 1 Notify RN   Urinalysis, Routine w reflex microscopic     Status: Abnormal   Collection Time: 12/02/16 11:53 AM  Result Value Ref Range   Color, Urine YELLOW (A) YELLOW   APPearance HAZY (A) CLEAR   Specific Gravity, Urine 1.025 1.005 - 1.030   pH 5.0 5.0 - 8.0   Glucose, UA >=500 (A) NEGATIVE mg/dL   Hgb urine dipstick NEGATIVE NEGATIVE   Bilirubin Urine NEGATIVE NEGATIVE   Ketones, ur NEGATIVE NEGATIVE mg/dL   Protein, ur NEGATIVE NEGATIVE mg/dL   Nitrite NEGATIVE NEGATIVE   Leukocytes, UA TRACE (A) NEGATIVE   RBC / HPF NONE SEEN 0 - 5 RBC/hpf   WBC, UA 0-5 0 - 5 WBC/hpf   Bacteria, UA RARE (A) NONE SEEN   Squamous Epithelial / LPF 0-5 (A) NONE SEEN   Mucus PRESENT    Ca Oxalate Crys, UA PRESENT   CBC     Status: Abnormal   Collection Time: 12/02/16 12:54 PM  Result Value Ref Range   WBC 6.8 3.6 - 11.0 K/uL   RBC 4.41 3.80 - 5.20 MIL/uL   Hemoglobin 11.8 (L) 12.0 - 16.0 g/dL   HCT 16.1 (L) 09.6 - 04.5 %   MCV 79.1 (L) 80.0 - 100.0 fL   MCH 26.7 26.0 - 34.0 pg   MCHC 33.8 32.0 - 36.0 g/dL   RDW 40.9 (H) 81.1 - 91.4 %   Platelets 262 150 - 440 K/uL  Comprehensive metabolic panel     Status: Abnormal   Collection Time: 12/02/16 12:54 PM  Result Value Ref Range   Sodium 138 135 - 145 mmol/L   Potassium 3.6 3.5 - 5.1 mmol/L   Chloride 111 101 - 111 mmol/L   CO2 19 (L) 22 - 32 mmol/L   Glucose, Bld 132 (H) 65 - 99 mg/dL   BUN 7 6 - 20 mg/dL   Creatinine, Ser 7.82 0.44 - 1.00 mg/dL   Calcium 9.0 8.9 - 95.6 mg/dL   Total Protein 6.5 6.5 - 8.1 g/dL   Albumin 2.6 (L) 3.5 - 5.0 g/dL   AST 17 15 - 41 U/L   ALT 11 (L) 14 - 54 U/L   Alkaline Phosphatase 155 (H) 38 - 126 U/L   Total Bilirubin 0.2 (L) 0.3 -  1.2 mg/dL   GFR calc non Af Amer >60 >60 mL/min   GFR calc Af Amer >60 >60 mL/min   Anion gap 8 5 - 15  Type and screen     Status: None   Collection Time: 12/02/16  2:07 PM  Result Value Ref Range   ABO/RH(D) A POS    Antibody Screen NEG    Sample Expiration 12/05/2016   Glucose, capillary     Status: None   Collection Time: 12/02/16  2:59 PM  Result Value Ref Range   Glucose-Capillary 80 65 - 99 mg/dL   A: IUP at [id number] with uncontrolled gestational diabetes/ fetopathy FHR tracing currently reassuring, reactive Prior CS x 3  P: Plans made for a repeat Cesarean section and BTL for noon tomorrow Preop labs drawn CBG fasting and 2 hr pp Sliding scale insulin coverage NS IV 2000 cal ADA diet, NPO after midnight Continuous fetal monitoring  Farrel Conners, CNM

## 2016-12-02 NOTE — Progress Notes (Signed)
Maternal-Fetal Medicine Asked to see patient to discuss delivery timing. Natasha Davidson is Davidson 26 year-old G5 P3013 at 21 4/7 weeks followed for A2GDM.  She was started on glyburide on August 10th and has required increasing in her dosing.   Today, she was seen at [facility] for fetal testing. Her NST was non-reactive and her blood sugar value was 163 (three hours after eating). Her sugar log is not currently available but is was noted at the clinic that her blood glucose levels have not been controlled and are increased across the board.  Plans were made to add metformin today.  Pregnancy complicated by polyhydramnios, chronic HTN (no medications), three prior c-sections and eleaved BMI.  Natasha Davidson has no complaints. She reports good fetal movement in triage and denies vaginal bleeding or leakage of fluid.  Exam: Vitals:   12/02/16 1023  BP: 128/79  Pulse: (!) 116  Resp: 18  Temp: 98.9 F (37.2 C)  SpO2: 100%   Fetal monitoring: 150's, moderate variability, reactive, no decels Toco: occasional contractions  Urine dip: No ketones, elevated glucose BS: 163   CMP, CBC, T&S pending  [place] (11/15/16, [facility]): EFW 3084 (78%) at 35 wks, AC three weeks ahead, AFI 18.6 [place] (11/25/16, [facility]): AFI 30.7, MVP 9.9 [place] (11/29/16, [facility]): AFI 25.3, MVP 8.2  Assessment and Recommendations 26 year-old G5 P3013 at 9 4/7 weeks sent to triage for prolonged monitoring secondary to non-reactive NST in clinic, where fetal testing being conducted for poorly controlled A2GDM (on glyburide) and chronic HTN.  Fetal testing is currently overall reassuring but agree to continue prolonged monitoring.  As her gestational diabetes is poorly controlled with signs of fetopathy (polyhydramnios and large AC), recommend to proceed with delivery in the late term period (i.e. during the 37th week). Should her fetal testing be reassuring today, then OK to proceed with delivery over the next few days.   Natasha Davidson, Natasha A,  MD

## 2016-12-02 NOTE — Progress Notes (Signed)
Pt was given 2 units of Novolog at 1417 for a CBG of 132 and patient become symptomatic of hypoglycemia. CBG was rechecked at 1459 and reading was 80. Pt had to eat crackers and peanut butter to raise her CBG and feel better. Last CBG at 1700 was 108 and according to sliding scale on MAR the pt required 3 units. After talking to the provider it was determined to hold the Novolog due to the pt reaction to the last dose. Provider was reviewing the order in the [facility].

## 2016-12-02 NOTE — Patient Instructions (Signed)
Gestational Diabetes Mellitus, Self Care When you have gestational diabetes (gestational diabetes mellitus), you must keep your blood sugar (glucose) under control. You can do this with:  Nutrition.  Exercise.  Lifestyle changes.  Medicines or insulin, if needed.  Support from your doctors and others.  How do I manage my blood sugar?  Check your blood sugar every day during pregnancy. Check it as often as told.  Call your doctor if your blood sugar is above your goal numbers for 2 tests in a row. Your doctor will set treatment goals for you. Try to have these blood sugars:  After not eating for a long time (after fasting): at or below 95 mg/dL (5.3 mmol/L).  After meals (postprandial): ? One hour after a meal: at or below 140 mg/dL (7.8 mmol/L). ? Two hours after a meal: at or below 120 mg/dL (6.7 mmol/L).  A1c (hemoglobin A1c) level: 6-6.5%.  What do I need to know about high blood sugar? High blood sugar is called hyperglycemia. Know the early signs of high blood sugar. Signs include:  Feeling: ? Thirsty. ? Hungry. ? Very tired.  Needing to pee (urinate) more than usual.  Blurry vision.  What do I need to know about low blood sugar? Low blood sugar is called hypoglycemia. This is when blood sugar is at or below 70 mg/dL (3.9 mmol/L). Symptoms may include:  Feeling: ? Hungry. ? Worried or nervous (anxious). ? Sweaty or clammy. ? Confused. ? Dizzy. ? Sleepy. ? Sick to your stomach (nauseous).  Having: ? A fast heartbeat. ? A headache. ? A change in vision. ? Jerky movements that you cannot control (seizure). ? Nightmares. ? Tingling or no feeling (numbness) around the mouth, lips, or tongue.  Having trouble with: ? Talking. ? Paying attention (concentrating). ? Moving (coordination). ? Sleeping.  Shaking.  Passing out (fainting).  Getting upset easily (irritability).  Treating low blood sugar  To treat low blood sugar, eat or drink something  sugary right away. If you can think clearly and swallow safely, follow the 15:15 rule:  Take 15 grams of a fast-acting carb (carbohydrate). Some fast-acting carbs are: ? 1 tube of glucose gel. ? 3 sugar tablets (glucose pills). ? 6-8 pieces of hard candy. ? 4 oz (120 mL) of fruit juice. ? 4 oz (120 mL) regular (not diet) soda.  Check your blood sugar 15 minutes after you take the carb.  If your blood sugar is still at or below 70 mg/dL (3.9 mmol/L), take 15 grams of a carb again.  If your blood sugar does not go above 70 mg/dL (3.9 mmol/L) after 3 tries, get help right away.  After your blood sugar goes back to normal, eat a meal or a snack within 1 hour.  Treating very low blood sugar If your blood sugar is at or below 54 mg/dL (3 mmol/L), you have very low blood sugar (severe hypoglycemia). This is an emergency. Do not wait to see if the symptoms will go away. Get medical help right away. Call your local emergency services (911 in the [place] Do not drive yourself to the hospital. If you have very low blood sugar and you cannot eat or drink, you may need a glucagon shot (injection). A family member or friend should learn:  How to check your blood sugar.  How to give you a glucagon shot.  Ask your doctor if you need a glucagon shot kit at home. What else is important to manage my diabetes? Medicine  Take your insulin and diabetes medicines as told.  Do not run out of insulin or medicines.  Adjust your insulin and diabetes medicines as told. Food   Make healthy food choices. These include: ? Chicken, fish, egg whites, and beans. ? Oats, whole wheat, bulgur, brown rice, quinoa, and millet. ? Fresh fruits and vegetables. ? Low-fat dairy products. ? Nuts, avocado, olive oil, and canola oil.  Make an eating plan. A food specialist (dietitian) can help you.  Follow instructions from your doctor about what you cannot eat or drink.  Drink enough fluid to keep your pee (urine)  clear or pale yellow.  Eat healthy snacks between healthy meals.  Keep track of carbs you eat. Read food labels. Learn about food serving sizes.  Follow your sick day plan when you cannot eat or drink normally. Make this plan with your doctor. Activity  Exercise 30 minutes or more a day or as much as told by your doctor.  Talk with your doctor if you start a new exercise. Your doctor may need to adjust your insulin, medicines, or food. Lifestyle  Do not drink alcohol.  Do not use any tobacco products. If you need help quitting, ask your doctor.  Learn how to deal with stress. If you need help with this, ask your doctor. Body care   Stay up to date with your shots (immunizations).  Brush your teeth and gums two times a day. Floss at least one time a day.  Go to the dentist least one time every 6 months.  Stay at a healthy weight while you are pregnant. General instructions  Take over-the-counter and prescription medicines only as told by your doctor.  Ask your doctor about risks of high blood pressure in pregnancy. These are called preeclampsia and eclampsia.  Share your diabetes care plan with: ? Your work or school. ? People you live with.  Check your pee for ketones: ? When you are sick. ? As told by your doctor.  Ask your doctor: ? Do I need to meet with a diabetes educator? ? Where can I find a support group for people with diabetes?  Carry a card or wear jewelry that says that you have diabetes.  Keep all follow-up visits with your doctor. This is important. Care after giving birth   Have your blood sugar checked 4-12 weeks after you give birth.  Get checked for diabetes at least every 3 years. Where to find more information: To learn more about diabetes, visit:  American Diabetes Association: www.diabetes.org/diabetes-basics/gestational  Centers for Disease Control and Prevention (CDC): http://sanchez-watson.com/.pdf  This  information is not intended to replace advice given to you by your health care provider. Make sure you discuss any questions you have with your health care provider. Document Released: 07/17/2015 Document Revised: 08/31/2015 Document Reviewed: 04/28/2015 Elsevier Interactive Patient Education  [employer].

## 2016-12-02 NOTE — Progress Notes (Signed)
GDMA2- Still porr control and does not tolerate higher dose of Glyburide    Will cont Glyburide 5/2.5 mg BID and ad Metformin 500 BID; discussed pros and cons of this approach vs Insulin.  With delivery in 10 days, will try this approach now.  Sx's to watch for related to high blood sugars discussed. CS- Sept 7. NST and AFI Thurs Today- A NST procedure was performed with FHR monitoring and a normal baseline established, but no appropriate accels >2 seen w 15x15 characteristics.  Results show a NON- REACTIVE NST.  Due to NST not yet reassuring, pt to go to L&D for extended monitoring  Annamarie Major, MD, [facility], [facility] 12/02/2016  9:33 AM

## 2016-12-03 ENCOUNTER — Inpatient Hospital Stay: Admission: RE | Admit: 2016-12-03 | Payer: Self-pay | Source: Ambulatory Visit | Admitting: Obstetrics and Gynecology

## 2016-12-03 ENCOUNTER — Encounter
Admission: EM | Disposition: A | Discharge status: Home / Self Care | Payer: Self-pay | Source: Home / Self Care | Attending: Advanced Practice Midwife

## 2016-12-03 ENCOUNTER — Inpatient Hospital Stay: Payer: Medicaid Other | Admitting: Anesthesiology

## 2016-12-03 DIAGNOSIS — Z3A37 37 weeks gestation of pregnancy: Secondary | ICD-10-CM

## 2016-12-03 DIAGNOSIS — O24425 Gestational diabetes mellitus in childbirth, controlled by oral hypoglycemic drugs: Secondary | ICD-10-CM

## 2016-12-03 DIAGNOSIS — Z302 Encounter for sterilization: Secondary | ICD-10-CM

## 2016-12-03 LAB — CREATININE, SERUM
Creatinine, Ser: 0.75 mg/dL (ref 0.44–1.00)
GFR calc non Af Amer: >60 mL/min (ref >60)

## 2016-12-03 LAB — KETONES, URINE: Ketones, ur: 5 mg/dL — AB

## 2016-12-03 LAB — GLUCOSE, CAPILLARY
GLUCOSE-CAPILLARY: 111 mg/dL — AB (ref 65–99)
GLUCOSE-CAPILLARY: 86 mg/dL (ref 65–99)
GLUCOSE-CAPILLARY: 71 mg/dL (ref 65–99)
Glucose-Capillary: 100 mg/dL — ABNORMAL HIGH (ref 65–99)

## 2016-12-03 LAB — RPR: RPR: NONREACTIVE

## 2016-12-03 SURGERY — Surgical Case
Anesthesia: Spinal | Wound class: Clean Contaminated

## 2016-12-03 MED ORDER — MENTHOL 3 MG MT LOZG
1.0000 | LOZENGE | OROMUCOSAL | Status: DC | PRN
  Filled 2016-12-03: qty 9

## 2016-12-03 MED ORDER — MORPHINE SULFATE (PF) 0.5 MG/ML IJ SOLN
INTRAMUSCULAR | Status: AC
Start: 2016-12-03 — End: 2016-12-03
  Filled 2016-12-03: qty 10

## 2016-12-03 MED ORDER — OXYTOCIN 40 UNITS IN LACTATED RINGERS INFUSION - SIMPLE MED
INTRAVENOUS | Status: AC
  Filled 2016-12-03: qty 1000

## 2016-12-03 MED ORDER — SODIUM CHLORIDE 0.9% FLUSH
3.0000 mL | INTRAVENOUS | Status: DC | PRN
Start: 2016-12-03 — End: 2016-12-03

## 2016-12-03 MED ORDER — SIMETHICONE 80 MG PO CHEW
80.0000 mg | CHEWABLE_TABLET | ORAL | Status: DC | PRN
  Administered 2016-12-04: 80 mg via ORAL
  Filled 2016-12-03: qty 1

## 2016-12-03 MED ORDER — ONDANSETRON HCL 4 MG/2ML IJ SOLN: 4.0000 mg | Freq: Four times a day (QID) | INTRAMUSCULAR | Status: DC | PRN

## 2016-12-03 MED ORDER — KETOROLAC TROMETHAMINE 30 MG/ML IJ SOLN
30.0000 mg | Freq: Four times a day (QID) | INTRAMUSCULAR | Status: DC | PRN
  Administered 2016-12-03: 30 mg via INTRAVENOUS
  Filled 2016-12-03: qty 1

## 2016-12-03 MED ORDER — BUPIVACAINE HCL (PF) 0.5 % IJ SOLN
INTRAMUSCULAR | Status: AC
  Filled 2016-12-03: qty 30

## 2016-12-03 MED ORDER — SENNOSIDES-DOCUSATE SODIUM 8.6-50 MG PO TABS: 2.0000 | ORAL_TABLET | ORAL | Status: DC

## 2016-12-03 MED ORDER — MORPHINE SULFATE (PF) 2 MG/ML IV SOLN: 1.0000 mg | INTRAVENOUS | Status: DC | PRN

## 2016-12-03 MED ORDER — OXYTOCIN 10 UNIT/ML IJ SOLN
INTRAVENOUS | Status: DC | PRN
  Administered 2016-12-03: 40 [IU] via INTRAVENOUS

## 2016-12-03 MED ORDER — DIPHENHYDRAMINE HCL 50 MG/ML IJ SOLN: 12.5000 mg | INTRAMUSCULAR | Status: DC | PRN

## 2016-12-03 MED ORDER — NALBUPHINE HCL 10 MG/ML IJ SOLN: 5.0000 mg | Freq: Once | INTRAMUSCULAR | Status: DC | PRN

## 2016-12-03 MED ORDER — FENTANYL CITRATE (PF) 100 MCG/2ML IJ SOLN
INTRAMUSCULAR | Status: AC
  Filled 2016-12-03: qty 2

## 2016-12-03 MED ORDER — ENOXAPARIN SODIUM 40 MG/0.4ML ~~LOC~~ SOLN
40.0000 mg | SUBCUTANEOUS | Status: DC
  Administered 2016-12-04: 40 mg via SUBCUTANEOUS
  Filled 2016-12-03: qty 0.4

## 2016-12-03 MED ORDER — MORPHINE SULFATE (PF) 0.5 MG/ML IJ SOLN
INTRAMUSCULAR | Status: DC | PRN
  Administered 2016-12-03: .2 mg via EPIDURAL

## 2016-12-03 MED ORDER — BUPIVACAINE IN DEXTROSE 0.75-8.25 % IT SOLN
INTRATHECAL | Status: AC
  Filled 2016-12-03: qty 2

## 2016-12-03 MED ORDER — ZOLPIDEM TARTRATE 5 MG PO TABS: 5.0000 mg | ORAL_TABLET | Freq: Every evening | ORAL | Status: DC | PRN

## 2016-12-03 MED ORDER — BUPIVACAINE HCL 0.5 % IJ SOLN
10.0000 mL | Freq: Once | INTRAMUSCULAR | Status: DC
  Filled 2016-12-03: qty 10

## 2016-12-03 MED ORDER — NALBUPHINE HCL 10 MG/ML IJ SOLN: 5.0000 mg | INTRAMUSCULAR | Status: DC | PRN

## 2016-12-03 MED ORDER — DIPHENHYDRAMINE HCL 25 MG PO CAPS: 25.0000 mg | ORAL_CAPSULE | ORAL | Status: DC | PRN

## 2016-12-03 MED ORDER — NALOXONE HCL 0.4 MG/ML IJ SOLN: 0.4000 mg | INTRAMUSCULAR | Status: DC | PRN

## 2016-12-03 MED ORDER — WITCH HAZEL-GLYCERIN EX PADS: 1.0000 "application " | MEDICATED_PAD | CUTANEOUS | Status: DC | PRN

## 2016-12-03 MED ORDER — DIPHENHYDRAMINE HCL 25 MG PO CAPS: 25.0000 mg | ORAL_CAPSULE | Freq: Four times a day (QID) | ORAL | Status: DC | PRN

## 2016-12-03 MED ORDER — KETOROLAC TROMETHAMINE 30 MG/ML IJ SOLN: 30.0000 mg | Freq: Four times a day (QID) | INTRAMUSCULAR | Status: DC | PRN

## 2016-12-03 MED ORDER — SIMETHICONE 80 MG PO CHEW: 80.0000 mg | CHEWABLE_TABLET | ORAL | Status: DC

## 2016-12-03 MED ORDER — CEFOXITIN SODIUM-DEXTROSE 2-2.2 GM-% IV SOLR (PREMIX)
2.0000 g | INTRAVENOUS | Status: AC
  Administered 2016-12-03: 2 g via INTRAVENOUS
  Filled 2016-12-03: qty 50

## 2016-12-03 MED ORDER — ACETAMINOPHEN 500 MG PO TABS: 1000.0000 mg | ORAL_TABLET | Freq: Four times a day (QID) | ORAL | Status: DC

## 2016-12-03 MED ORDER — IBUPROFEN 600 MG PO TABS: 600.0000 mg | ORAL_TABLET | Freq: Four times a day (QID) | ORAL | Status: DC | PRN

## 2016-12-03 MED ORDER — PRENATAL MULTIVITAMIN CH: 1.0000 | ORAL_TABLET | Freq: Every day | ORAL | Status: DC

## 2016-12-03 MED ORDER — MEPERIDINE HCL 25 MG/ML IJ SOLN: 6.2500 mg | INTRAMUSCULAR | Status: DC | PRN

## 2016-12-03 MED ORDER — ACETAMINOPHEN 325 MG PO TABS: 650.0000 mg | ORAL_TABLET | ORAL | Status: DC | PRN

## 2016-12-03 MED ORDER — DEXTROSE-NACL 5-0.45 % IV SOLN
INTRAVENOUS | Status: DC
  Administered 2016-12-03: 08:00:00 via INTRAVENOUS

## 2016-12-03 MED ORDER — COCONUT OIL OIL: 1.0000 "application " | TOPICAL_OIL | Status: DC | PRN

## 2016-12-03 MED ORDER — OXYCODONE-ACETAMINOPHEN 5-325 MG PO TABS
2.0000 | ORAL_TABLET | ORAL | Status: DC | PRN
  Administered 2016-12-03 – 2016-12-04 (×3): 2 via ORAL
  Filled 2016-12-03 (×3): qty 2

## 2016-12-03 MED ORDER — NALOXONE HCL 2 MG/2ML IJ SOSY
1.0000 ug/kg/h | PREFILLED_SYRINGE | INTRAVENOUS | Status: DC | PRN
  Filled 2016-12-03: qty 2

## 2016-12-03 MED ORDER — PHENYLEPHRINE 40 MCG/ML (10ML) SYRINGE FOR IV PUSH (FOR BLOOD PRESSURE SUPPORT)
PREFILLED_SYRINGE | INTRAVENOUS | Status: DC | PRN
  Administered 2016-12-03 (×4): 100 ug via INTRAVENOUS

## 2016-12-03 MED ORDER — PROPOFOL 10 MG/ML IV BOLUS
INTRAVENOUS | Status: AC
  Filled 2016-12-03: qty 20

## 2016-12-03 MED ORDER — BUPIVACAINE HCL (PF) 0.5 % IJ SOLN
INTRAMUSCULAR | Status: DC | PRN
  Administered 2016-12-03: 6 mL

## 2016-12-03 MED ORDER — BUPIVACAINE IN DEXTROSE 0.75-8.25 % IT SOLN
INTRATHECAL | Status: DC | PRN
  Administered 2016-12-03: 1.8 mL via INTRATHECAL

## 2016-12-03 MED ORDER — LACTATED RINGERS IV SOLN
INTRAVENOUS | Status: DC
  Administered 2016-12-03: 18:00:00 via INTRAVENOUS

## 2016-12-03 MED ORDER — OXYTOCIN 40 UNITS IN LACTATED RINGERS INFUSION - SIMPLE MED
2.5000 [IU]/h | INTRAVENOUS | Status: DC
  Administered 2016-12-03: 2.5 [IU]/h via INTRAVENOUS

## 2016-12-03 MED ORDER — DIBUCAINE 1 % RE OINT: 1.0000 "application " | TOPICAL_OINTMENT | RECTAL | Status: DC | PRN

## 2016-12-03 MED ORDER — BUPIVACAINE 0.25 % ON-Q PUMP DUAL CATH 400 ML
400.0000 mL | INJECTION | Status: DC
  Filled 2016-12-03: qty 400

## 2016-12-03 MED ORDER — OXYCODONE-ACETAMINOPHEN 5-325 MG PO TABS
1.0000 | ORAL_TABLET | ORAL | Status: DC | PRN
  Filled 2016-12-03: qty 1

## 2016-12-03 MED ORDER — SIMETHICONE 80 MG PO CHEW
80.0000 mg | CHEWABLE_TABLET | Freq: Three times a day (TID) | ORAL | Status: DC
  Administered 2016-12-04: 80 mg via ORAL
  Filled 2016-12-03: qty 1

## 2016-12-03 SURGICAL SUPPLY — 35 items
ADH SKN CLS APL DERMABOND .7 (GAUZE/BANDAGES/DRESSINGS) ×1
BARRIER ADHS 3X4 INTERCEED (GAUZE/BANDAGES/DRESSINGS) ×1 IMPLANT
BRR ADH 4X3 ABS CNTRL BYND (GAUZE/BANDAGES/DRESSINGS) ×1
CANISTER SUCT 3000ML PPV (MISCELLANEOUS) ×2 IMPLANT
CATH KIT ON-Q SILVERSOAK 5 (CATHETERS) ×2 IMPLANT
CATH KIT ON-Q SILVERSOAK 5IN (CATHETERS) ×4 IMPLANT
CHLORAPREP W/TINT 26ML (MISCELLANEOUS) ×4 IMPLANT
DERMABOND ADVANCED (GAUZE/BANDAGES/DRESSINGS) ×1
DERMABOND ADVANCED .7 DNX12 (GAUZE/BANDAGES/DRESSINGS) ×1 IMPLANT
DRSG OPSITE POSTOP 4X10 (GAUZE/BANDAGES/DRESSINGS) ×2 IMPLANT
ELECT CAUTERY BLADE 6.4 (BLADE) ×2 IMPLANT
ELECT REM PT RETURN 9FT ADLT (ELECTROSURGICAL) ×2
ELECTRODE REM PT RTRN 9FT ADLT (ELECTROSURGICAL) ×1 IMPLANT
GLOVE BIOGEL PI IND STRL 7.0 (GLOVE) IMPLANT
GLOVE BIOGEL PI IND STRL 7.5 (GLOVE) IMPLANT
GLOVE BIOGEL PI INDICATOR 7.0 (GLOVE) ×2
GLOVE BIOGEL PI INDICATOR 7.5 (GLOVE) ×2
GLOVE SKINSENSE NS SZ8.0 LF (GLOVE) ×1
GLOVE SKINSENSE STRL SZ8.0 LF (GLOVE) ×1 IMPLANT
GLOVE SURG SYN 7.0 (GLOVE) ×4 IMPLANT
GLOVE SURG SYN 7.0 PF PI (GLOVE) IMPLANT
GOWN STRL REUS W/ TWL LRG LVL3 (GOWN DISPOSABLE) ×1 IMPLANT
GOWN STRL REUS W/ TWL XL LVL3 (GOWN DISPOSABLE) ×2 IMPLANT
GOWN STRL REUS W/TWL LRG LVL3 (GOWN DISPOSABLE) ×4
GOWN STRL REUS W/TWL XL LVL3 (GOWN DISPOSABLE) ×4
KIT PREVENA INCISION MGT20CM45 (CANNISTER) ×1 IMPLANT
NS IRRIG 1000ML POUR BTL (IV SOLUTION) ×2 IMPLANT
PACK C SECTION AR (MISCELLANEOUS) ×2 IMPLANT
PAD OB MATERNITY 4.3X12.25 (PERSONAL CARE ITEMS) ×2 IMPLANT
PAD PREP 24X41 OB/GYN DISP (PERSONAL CARE ITEMS) ×2 IMPLANT
SUT MAXON ABS #0 GS21 30IN (SUTURE) ×4 IMPLANT
SUT PLAIN 2 0 XLH (SUTURE) ×1 IMPLANT
SUT VIC AB 1 CT1 36 (SUTURE) ×7 IMPLANT
SUT VIC AB 2-0 CT1 36 (SUTURE) ×3 IMPLANT
SUT VIC AB 4-0 FS2 27 (SUTURE) ×1 IMPLANT

## 2016-12-03 NOTE — Transfer of Care (Signed)
Immediate Anesthesia Transfer of Care Note  Patient: Natasha Davidson  Procedure(s) Performed: Procedure(s) with comments: CESAREAN SECTION (N/A) - Time-13:04, Gender-Girl Wgt-9lbs 15oz, Apgar- 9/9   Patient Location: PACU  Anesthesia Type:spinal  Level of Consciousness: awake  Airway & Oxygen Therapy: Patient Spontanous Breathing  Post-op Assessment: Report given to RN and Post -op Vital signs reviewed and stable  Post vital signs: Reviewed and stable  Last Vitals: 93 pulse 98.2 temp 99%O2 Sat. 18 Resp 137/71 @1355  Vitals:   12/03/16 0741 12/03/16 0742  BP:  119/63  Pulse:  94  Resp:  18  Temp: 36.8 C   SpO2:      Last Pain:  Vitals:   12/03/16 0741  TempSrc: Oral  PainSc: 8          Complications: No apparent anesthesia complications

## 2016-12-03 NOTE — Op Note (Signed)
Cesarean Section Procedure Note Indications: Gestational Diabetes (poor glycemic control), Polyhydramnios, Obesity, Term, and prior cesarean section, Desire for permanent sterility  Pre-operative Diagnosis: Intrauterine pregnancy 110w5d ;  Gestational Diabetes (poor glycemic control), Polyhydramnios, Obesity, prior cesarean section and term intrauterine pregnancy, Desire for permanent sterility Post-operative Diagnosis: same, delivered. Procedure: Low Transverse Cesarean Section, Bilateral Tubal Ligation Surgeon: Annamarie Major, MD Assistant(s): Channing Mutters Anesthesia: Spinal anesthesia Estimated Blood Loss:1000 Complications: None; patient tolerated the procedure well. Disposition: PACU - hemodynamically stable. Condition: stable  Findings: A female infant in the cephalic presentation. Amniotic fluid - Clear  Birth weight 9-15 lbs.  Apgars of 9 and 9.  Intact placenta with a three-vessel cord. Grossly normal uterus, tubes and ovaries bilaterally. Significant intraabdominal adhesions were noted especially in the anterior abdominal wall to lower uterine segment.  Procedure Details   The patient was taken to Operating Room, identified as the correct patient and the procedure verified as C-Section Delivery. A Time Out was held and the above information confirmed. After induction of anesthesia, the patient was draped and prepped in the usual sterile manner. A Pfannenstiel incision was made and carried down through the subcutaneous tissue to the fascia.  Extensive dissection of adhesions performed. Bladder examined to be out of harms way. Fascial incision was made and extended transversely with the Mayo scissors. The fascia was separated from the underlying rectus tissue superiorly and inferiorly. The peritoneum was identified and entered bluntly. Peritoneal incision was extended longitudinally. The utero-vesical peritoneal reflection was incised transversely and a bladder flap was created digitally.   A low transverse hysterotomy was made. The fetus was delivered atraumatically. The umbilical cord was clamped x2 and cut and the infant was handed to the awaiting pediatricians. The placenta was removed intact and appeared normal with a 3-vessel cord.  The uterus was exteriorized and cleared of all clot and debris. The hysterotomy was closed with running sutures of 0 Vicryl suture. A second imbricating layer was placed with the same suture. Excellent hemostasis was observed.   The left Fallopian tube was identified, grasped with the Babcock clamps, lifted to the skin incision and followed out distally to the fimbriae. An avascular midsection of the tube approximately 3-4cm from the cornua was grasped with the babcock clamps and brought into a knuckle at the skin incision. The tube was double ligated with 2-0 Vicryl suture and the intervening portion of tube was transected and removed. Excellent hemostasis was noted and the tube was returned to the abdomen. Attention was then turned to the right fallopian tube after confirmation of identification by tracing the tube out to the fimbriae. The same procedure was then performed on the right Fallopian tube. Again, excellent hemostasis was noted at the end of the procedure.  The uterus was returned to the abdomen. The pelvis was irrigated and again, excellent hemostasis was noted.  The On Q Pain pump System was then placed.  Trocars were placed through the abdominal wall into the subfascial space and these were used to thread the silver soaker cathaters into place.The rectus fascia was then reapproximated with running sutures of Maxon, with careful placement not to incorporate the cathaters. Subcutaneous tissues are then irrigated with saline and hemostasis assured.  Skin is then closed with staples followed by the Prevena dressing. The cathaters are flushed each with 5 mL of Bupivicaine and stabilized into place with dressing. Instrument, sponge, and needle  counts were correct prior to the abdominal closure and at the conclusion of the case.  The  patient tolerated the procedure well and was transferred to the recovery room in stable condition.  All sponge instrument and needle counts correct.  Foley with clear urine.  Annamarie Major, MD, [facility], [facility] 12/03/2016  1:55 PM

## 2016-12-03 NOTE — Lactation Note (Signed)
This note was copied from a baby's chart. Lactation Consultation Note  Patient Name: Natasha Davidson FAOZH'Y Date: 12/03/2016  RN told me that this Mom has baby in [facility] and wants to pump milk for her. I brought pump to room and set it up and started to educate when Neo came in to report to Mom about her baby. The news was upsetting to mom, so I stayed to comfort her and to learn that she was calling family to come keep her company. Mom cannot focus on pumping now, so I reported to RN who plans to help her with pump as soon as mom is ready. I plan to F/U in the morning.    Maternal Data    Feeding Feeding Type: Bottle Fed - Formula Nipple Type: Slow - flow  LATCH Score                   Interventions    Lactation Tools Discussed/Used     Consult Status      Sunday Corn 12/03/2016, 5:42 PM

## 2016-12-03 NOTE — Anesthesia Procedure Notes (Signed)
Spinal  Start time: 12/03/2016 1:04 PM End time: 12/03/2016 1:48 PM Staffing Anesthesiologist: Naomie Dean Resident/CRNA: Henrietta Hoover Performed: resident/CRNA and anesthesiologist  Preanesthetic Checklist Completed: patient identified, site marked, surgical consent, pre-op evaluation, timeout performed, IV checked, risks and benefits discussed and monitors and equipment checked Spinal Block Patient position: sitting Prep: ChloraPrep Patient monitoring: heart rate, continuous pulse ox and blood pressure Approach: midline Location: L3-4 Injection technique: single-shot Needle Needle type: Pencan  Needle gauge: 25 G Needle length: 12.7 cm Needle insertion depth: 10 cm Assessment Sensory level: T4

## 2016-12-03 NOTE — Anesthesia Post-op Follow-up Note (Signed)
Anesthesia QCDR form completed.        

## 2016-12-03 NOTE — Anesthesia Preprocedure Evaluation (Signed)
Anesthesia Evaluation  Patient identified by MRN, date of birth, ID band Patient awake    Reviewed: Allergy & Precautions, NPO status , Patient's Chart, lab work & pertinent test results  History of Anesthesia Complications Negative for: history of anesthetic complications  Airway Mallampati: III       Dental   Pulmonary neg sleep apnea, neg COPD,           Cardiovascular hypertension (as a child),      Neuro/Psych neg Seizures Anxiety Depression    GI/Hepatic Neg liver ROS, neg GERD  ,  Endo/Other  diabetes, Type 2, Oral Hypoglycemic Agents  Renal/GU negative Renal ROS     Musculoskeletal   Abdominal   Peds  Hematology   Anesthesia Other Findings   Reproductive/Obstetrics                             Anesthesia Physical Anesthesia Plan  ASA: III  Anesthesia Plan: Spinal   Post-op Pain Management:    Induction:   PONV Risk Score and Plan:   Airway Management Planned:   Additional Equipment:   Intra-op Plan:   Post-operative Plan:   Informed Consent: I have reviewed the patients History and Physical, chart, labs and discussed the procedure including the risks, benefits and alternatives for the proposed anesthesia with the patient or authorized representative who has indicated his/her understanding and acceptance.     Plan Discussed with:   Anesthesia Plan Comments:         Anesthesia Quick Evaluation

## 2016-12-03 NOTE — Progress Notes (Signed)
  Labor Progress Note   26 y.o. [id number] @ [redacted]w[redacted]d , admitted for Gest Diabetes, Prior CS.  Subjective:  Some cramps and pains this am. BS have been 100s mostly, currently NPO Plan is for CS BTL today  Objective:  BP 138/79   Pulse 97   Temp 98.4 F (36.9 C) (Oral)   Resp 20   Ht 5\' 4"  (1.626 m)   Wt 285 lb (129.3 kg)   LMP 03/14/2016 (Exact Date)   SpO2 100%   BMI 48.92 kg/m  Abd: mild T to touch, gravid, obese, scar noted Extr: trace to 1+ bilateral pedal edema SVE: deferred  EFM: FHR: 140 bpm, variability: minimal ,  accelerations:  Abscent,  decelerations:  Absent    Earlier Accels and reassuring FHT pattern Toco: Frequency: Every 5-9 minutes Labs: I have reviewed the patient's lab results.   Assessment & Plan:  [id number] @ [redacted]w[redacted]d, admitted for  Pregnancy and Labor/Delivery Management  1. Pain management: none. 2. FWB: FHT category 1.  3. ID: GBS negative 4. Labor management: Plan CS BTL today The risks of cesarean section discussed with the patient included but were not limited to: bleeding which may require transfusion or reoperation; infection which may require antibiotics; injury to bowel, bladder, ureters or other surrounding organs; injury to the fetus; need for additional procedures including hysterectomy in the event of a life-threatening hemorrhage; placental abnormalities wth subsequent pregnancies, incisional problems, thromboembolic phenomenon and other postoperative/anesthesia complications. The patient concurred with the proposed plan, giving informed written consent for the procedure.  The patient has been fully informed about all methods of contraception, both temporary and permanent. She understands that tubal ligation is meant to be permanent, absolute and irreversible. She was told that there is an approximately 1 in 400 chance of a pregnancy in the future after tubal ligation. She was told the short and long term complications of tubal ligation. She  understands the risks from this surgery include, but are not limited to, the risks of anesthesia, hemorrhage, infection, perforation, and injury to adjacent structures, bowel, bladder and blood vessels.   All discussed with patient, see orders  Annamarie Major, MD, [facility], [facility] 12/03/2016  7:28 AM

## 2016-12-03 NOTE — Discharge Instructions (Signed)

## 2016-12-03 NOTE — Discharge Summary (Signed)
OB Discharge Summary     Patient Name: Natasha Davidson DOB: 1990/10/22 MRN: [id number]  Date of admission: 12/02/2016 Delivering MD: Letitia Libra, MD  Date of Delivery: 12/02/2016  Date of discharge: 12/04/2016  Admitting diagnosis: repeat cesarean Intrauterine pregnancy: [redacted]w[redacted]d     Secondary diagnosis: Gestational Diabetes medication controlled (A2), Obesity with BMI 48, Polyhydramnios, chronic hypertension affective pregnancy     Discharge diagnosis: Term Pregnancy Delivered, GDM A2 and Polyhydramnios and Obesity, Reasons for cesarean section  Elective repeat, chronic hypertension affecting pregnancy                         Hospital course:  Sceduled C/S   26 y.o. yo [id number] at [redacted]w[redacted]d was admitted to the hospital 12/02/2016 for fetal monitoring with an non-reactive NST in clinic.  She was seen by [facility] while in-house with the recommendation for delivery soon with the following indication:Elective Repeat and Diabetes. Also a desire for permanent sterilization.   Membrane Rupture Time/Date: 1:03 PM ,12/03/2016   Patient delivered a Viable infant.12/03/2016  Details of operation can be found in separate operative note.  Pateint had an uncomplicated postpartum course.  She is ambulating, tolerating a regular diet, passing flatus, and urinating well. She does not quite meet discharge requirements due to being post-op day #1.  Her newborn infant is being transferred to [facility] for cardiopulmonary issues and desires transfer to be with her infant.  She has been accepted in transfer by Dr. Maryclare Bean from [place].                                                                   Post partum procedures:none  Complications: None  Physical exam on 12/04/2016: Vitals:   12/03/16 1944 12/04/16 0009 12/04/16 0350 12/04/16 0714  BP: 128/79 (!) 127/59 125/72 124/72  Pulse: (!) 114 (!) 108 (!) 104 100  Resp: 20 20  18   Temp: 97.9 F (36.6 C) 98.5 F (36.9 C) 98.5 F (36.9 C) 98.4 F (36.9 C)  TempSrc: Oral  Oral Oral Oral  SpO2: 100%  96% 100%  Weight:      Height:       General: alert, cooperative and no distress Lochia: appropriate Uterine Fundus: firm Incision: Healing well with no significant drainage, On Q pump and Provena wound vac in place and functioning well.  DVT Evaluation: No evidence of DVT seen on physical exam. No cords or calf tenderness. No significant calf/ankle edema.  Labs: Lab Results  Component Value Date   WBC 7.9 12/04/2016   HGB 10.1 (L) 12/04/2016   HCT 30.4 (L) 12/04/2016   MCV 80.2 12/04/2016   PLT 178 12/04/2016   CMP Latest Ref Rng & Units 12/03/2016  Glucose 65 - 99 mg/dL -  BUN 6 - 20 mg/dL -  Creatinine 6.96 - 2.95 mg/dL 2.84  Sodium 132 - 440 mmol/L -  Potassium 3.5 - 5.1 mmol/L -  Chloride 101 - 111 mmol/L -  CO2 22 - 32 mmol/L -  Calcium 8.9 - 10.3 mg/dL -  Total Protein 6.5 - 8.1 g/dL -  Total Bilirubin 0.3 - 1.2 mg/dL -  Alkaline Phos 38 - 102 U/L -  AST 15 - 41 U/L -  ALT  14 - 54 U/L -    Discharge instruction: per After Visit Summary.  Medications:  Continue current medications per orders until arrives at receiving institution.  Diet: carb modified diet  Activity: Advance as tolerated. Pelvic rest for 6 weeks.   Outpatient follow up: Follow-up Information    Nadara Mustard, MD. Schedule an appointment as soon as possible for a visit in 1 week(s).   Specialty:  Obstetrics and Gynecology Why:  Needs Incision check next Tues for dressing removal and staple removal (any MD) Contact information: [address] [place] [address] [phone]             Postpartum contraception: Tubal Ligation Rhogam Given postpartum: no Rubella vaccine given postpartum: no Varicella vaccine given postpartum: no TDaP given antepartum or postpartum: will need to be given postpartum  Newborn Data: Live born female  Birth Weight: 9 lb 15.1 oz (4510 g) APGAR: 9, 9   Baby Feeding: per NICU  Disposition:transfer to [facility], accepted by Dr. Maryclare Bean at [facility].   SIGNED: Thomasene Mohair, MD 12/04/2016 10:44 AM

## 2016-12-04 ENCOUNTER — Ambulatory Visit (HOSPITAL_COMMUNITY)
Admission: AD | Admit: 2016-12-04 | Discharge: 2016-12-04 | Disposition: A | Discharge status: Home / Self Care | Payer: Medicaid Other | Source: Other Acute Inpatient Hospital | Attending: Advanced Practice Midwife | Admitting: Advanced Practice Midwife

## 2016-12-04 ENCOUNTER — Encounter: Payer: Self-pay | Admitting: Obstetrics & Gynecology

## 2016-12-04 LAB — GLUCOSE, CAPILLARY
GLUCOSE-CAPILLARY: 123 mg/dL — AB (ref 65–99)
Glucose-Capillary: 126 mg/dL — ABNORMAL HIGH (ref 65–99)

## 2016-12-04 LAB — CBC
HEMATOCRIT: 30.4 % — AB (ref 35.0–47.0)
Hemoglobin: 10.1 g/dL — ABNORMAL LOW (ref 12.0–16.0)
MCH: 26.7 pg (ref 26.0–34.0)
MCHC: 33.3 g/dL (ref 32.0–36.0)
MCV: 80.2 fL (ref 80.0–100.0)
Platelets: 178 10*3/uL (ref 150–440)
RBC: 3.79 MIL/uL — ABNORMAL LOW (ref 3.80–5.20)
RDW: 14.4 % (ref 11.5–14.5)
WBC: 7.9 10*3/uL (ref 3.6–11.0)

## 2016-12-04 LAB — SURGICAL PATHOLOGY

## 2016-12-04 MED ORDER — KETOROLAC TROMETHAMINE 30 MG/ML IJ SOLN: 30.0000 mg | Freq: Four times a day (QID) | INTRAMUSCULAR | Status: DC

## 2016-12-04 NOTE — Progress Notes (Signed)
NB brought in to room for mom to see prior to transfer

## 2016-12-04 NOTE — Anesthesia Postprocedure Evaluation (Signed)
Anesthesia Post Note  Patient: Natasha Davidson  Procedure(s) Performed: Procedure(s) (LRB): CESAREAN SECTION (N/A)  Patient location during evaluation: Mother Baby Anesthesia Type: Spinal Level of consciousness: awake, awake and alert and oriented Pain management: pain level controlled Vital Signs Assessment: post-procedure vital signs reviewed and stable Respiratory status: spontaneous breathing Cardiovascular status: blood pressure returned to baseline Postop Assessment: no headache, no backache, no signs of nausea or vomiting and adequate PO intake Anesthetic complications: no     Last Vitals:  Vitals:   12/04/16 0350 12/04/16 0714  BP: 125/72 124/72  Pulse: (!) 104 100  Resp:  18  Temp: 36.9 C 36.9 C  SpO2: 96% 100%    Last Pain:  Vitals:   12/04/16 0714  TempSrc: Oral  PainSc:                  Natasha Davidson

## 2016-12-04 NOTE — Progress Notes (Signed)
Pt transferred to [facility] via [facility] on stretcher.  Pt had 1 carry bag, purse, and a breast pump kit when leaving.  Family at bedside.

## 2016-12-04 NOTE — Anesthesia Post-op Follow-up Note (Signed)
  Anesthesia Pain Follow-up Note  Patient: Natasha Davidson  Day #: 1  Date of Follow-up: 12/04/2016 Time: 8:28 AM  Last Vitals:  Vitals:   12/04/16 0350 12/04/16 0714  BP: 125/72 124/72  Pulse: (!) 104 100  Resp:  18  Temp: 36.9 C 36.9 C  SpO2: 96% 100%    Level of Consciousness: alert  Pain: none   Side Effects:None  Catheter Site Exam:clean, dry, no drainage  Anti-Coag Meds    Start     Dose/Rate Route Frequency Ordered Stop   12/04/16 0800  enoxaparin (LOVENOX) injection 40 mg     40 mg Subcutaneous Every 24 hours 12/03/16 1410         Plan: D/C from anesthesia care at surgeon's request  Eain Mullendore Lawerance Cruel

## 2016-12-06 ENCOUNTER — Encounter: Payer: Medicaid Other | Admitting: Advanced Practice Midwife

## 2016-12-06 ENCOUNTER — Other Ambulatory Visit: Payer: Medicaid Other

## 2016-12-10 ENCOUNTER — Ambulatory Visit: Payer: Self-pay | Admitting: Obstetrics & Gynecology

## 2016-12-10 NOTE — Telephone Encounter (Signed)
Letter given  

## 2016-12-12 ENCOUNTER — Encounter: Payer: Self-pay | Admitting: Obstetrics and Gynecology

## 2016-12-12 ENCOUNTER — Ambulatory Visit (INDEPENDENT_AMBULATORY_CARE_PROVIDER_SITE_OTHER): Payer: Medicaid Other | Admitting: Obstetrics and Gynecology

## 2016-12-12 ENCOUNTER — Other Ambulatory Visit: Payer: Medicaid Other

## 2016-12-12 ENCOUNTER — Encounter: Payer: Medicaid Other | Admitting: Obstetrics and Gynecology

## 2016-12-12 VITALS — BP 140/82 | Ht 64.0 in | Wt 276.0 lb

## 2016-12-12 DIAGNOSIS — Z98891 History of uterine scar from previous surgery: Secondary | ICD-10-CM

## 2016-12-12 NOTE — Progress Notes (Signed)
   Postoperative Follow-up Patient presents post op from cesarean section with tubal ligation  1weeks ago.  Subjective: She denies fever, chills, nausea and vomiting. Eating a regular diet without difficulty. The patient is not having any pain.  Activity: increasing slowly. She denies issues with her incision.   Some lower back pain with radiation to her lower legs. Swelling in bilateral legs which improves at the end of the day.    Objective: BP 140/82 (BP Location: Left Arm, Patient Position: Sitting, Cuff Size: Large)   Ht 5\' 4"  (1.626 m)   Wt 276 lb (125.2 kg)   LMP 03/14/2016 (Exact Date)   BMI 47.38 kg/m   Constitutional: Well nourished, well developed female in no acute distress.  HEENT: normal Skin: Warm and dry.  Abdomen: obese, +BS, no rebound or guarding clean, dry, intact and Provena wound vac removed, staples removed, benzoin and steri-strips placed without difficulyt. the incision is clean, dry, and intact Extremity: no edema   Assessment: 26 y.o. s/p cesarean section with tubal ligation progressing well  Plan: Patient has done well after surgery with no apparent complications.  I have discussed the post-operative course to date, and the expected progress moving forward.  The patient understands what complications to be concerned about.    Activity plan: increase activity slowly  Follow up 1 week for incision check  Thomasene MohairStephen Derwood Becraft, MD 12/12/2016 3:05 PM

## 2016-12-18 ENCOUNTER — Ambulatory Visit (INDEPENDENT_AMBULATORY_CARE_PROVIDER_SITE_OTHER): Payer: Medicaid Other | Admitting: Obstetrics and Gynecology

## 2016-12-18 ENCOUNTER — Encounter: Payer: Self-pay | Admitting: Obstetrics and Gynecology

## 2016-12-18 VITALS — BP 130/82 | Wt 258.0 lb

## 2016-12-18 DIAGNOSIS — T814XXA Infection following a procedure, initial encounter: Secondary | ICD-10-CM

## 2016-12-18 DIAGNOSIS — L0390 Cellulitis, unspecified: Secondary | ICD-10-CM

## 2016-12-18 DIAGNOSIS — IMO0001 Reserved for inherently not codable concepts without codable children: Secondary | ICD-10-CM

## 2016-12-18 MED ORDER — CEPHALEXIN 500 MG PO CAPS: 500.0000 mg | ORAL_CAPSULE | Freq: Four times a day (QID) | ORAL | 0 refills | Status: DC

## 2016-12-18 NOTE — Progress Notes (Signed)
   Postoperative Follow-up Patient presents post op from cesarean section with tubal ligation  2weeks ago.  Subjective: She denies fever, chills, nausea and vomiting. Eating a regular diet without difficulty. The patient is not having any pain.  Activity: increasing slowly. She denies issues with her incision.   tenderness and pain on right aspect of incision. No drainage from incision.   Objective: BP 130/82 (BP Location: Left Arm, Patient Position: Sitting, Cuff Size: Large)   Wt 258 lb (117 kg)   LMP 03/14/2016 (Exact Date)   BMI 44.29 kg/m   Constitutional: Well nourished, well developed female in no acute distress.  HEENT: normal Skin: Warm and dry.  Abdomen: obese, +BS, no rebound or guarding clean, dry, intact and on the right third of the incision line there is erythema extending about 5 cm on both sides of the incision, there is warmth, tenderness and mild induration Extremity: no edema   Assessment: 26 y.o. s/p cesarean section with tubal ligation with wound cellulitis on right aspect  Plan: Keflex 500 mg four times daily x 1 week.   Follow up 1 week for incision check  Thomasene MohairStephen Jong Rickman, MD 12/18/2016 11:42 AM

## 2016-12-24 ENCOUNTER — Ambulatory Visit: Payer: Medicaid Other | Admitting: Obstetrics and Gynecology

## 2017-01-03 ENCOUNTER — Encounter: Payer: Self-pay | Admitting: Emergency Medicine

## 2017-01-03 ENCOUNTER — Emergency Department
Admission: EM | Admit: 2017-01-03 | Discharge: 2017-01-03 | Disposition: A | Discharge status: Home / Self Care | Payer: Medicaid Other | Attending: Emergency Medicine | Admitting: Emergency Medicine

## 2017-01-03 DIAGNOSIS — I10 Essential (primary) hypertension: Secondary | ICD-10-CM | POA: Insufficient documentation

## 2017-01-03 DIAGNOSIS — R21 Rash and other nonspecific skin eruption: Secondary | ICD-10-CM | POA: Diagnosis not present

## 2017-01-03 DIAGNOSIS — E119 Type 2 diabetes mellitus without complications: Secondary | ICD-10-CM | POA: Insufficient documentation

## 2017-01-03 DIAGNOSIS — L0100 Impetigo, unspecified: Secondary | ICD-10-CM | POA: Insufficient documentation

## 2017-01-03 DIAGNOSIS — J029 Acute pharyngitis, unspecified: Secondary | ICD-10-CM | POA: Insufficient documentation

## 2017-01-03 DIAGNOSIS — R509 Fever, unspecified: Secondary | ICD-10-CM | POA: Diagnosis present

## 2017-01-03 LAB — POCT RAPID STREP A: Streptococcus, Group A Screen (Direct): NEGATIVE

## 2017-01-03 MED ORDER — MAGIC MOUTHWASH W/LIDOCAINE: 5.0000 mL | Freq: Four times a day (QID) | ORAL | 0 refills | Status: DC

## 2017-01-03 MED ORDER — MUPIROCIN CALCIUM 2 % EX CREA: TOPICAL_CREAM | CUTANEOUS | 0 refills | Status: DC

## 2017-01-03 MED ORDER — CEPHALEXIN 500 MG PO CAPS: 500.0000 mg | ORAL_CAPSULE | Freq: Four times a day (QID) | ORAL | 0 refills | Status: DC

## 2017-01-03 NOTE — ED Triage Notes (Signed)
Sore throat and fever for 3 days.

## 2017-01-03 NOTE — ED Provider Notes (Signed)
[facility] Emergency Department Provider Note  ____________________________________________   First MD Initiated Contact with Patient 01/03/17 1556     (approximate)  I have reviewed the triage vital signs and the nursing notes.   HISTORY  Chief Complaint Fever and Sore Throat    HPI Natasha Davidson is a 26 y.o. female patient complaining of sore throat and fever for 3 days. Patient states she broke out in a facial rash today. Patient stated pain increases swallowing. Patient rates the pain as a 10 over 10. No palliative measures for complaint.   Past Medical History:  Diagnosis Date  . Anxiety   . Depression   . Diabetes mellitus without complication (HCC)   . Gestational diabetes   . Hypertension    pih  . Obesity affecting pregnancy     Patient Active Problem List   Diagnosis Date Noted  . Non-reactive NST (non-stress test) 12/02/2016  . Gestational diabetes mellitus (GDM) in third trimester 12/02/2016  . Polyhydramnios in third trimester 11/25/2016  . History of chronic hypertension 11/20/2016  . Oral hypoglycemic controlled White classification A2 gestational diabetes mellitus (GDM) 11/04/2016  . Supervision of high-risk pregnancy 07/30/2016  . Obesity affecting pregnancy in third trimester, antepartum 05/16/2015  . BMI 40.0-44.9, adult (HCC) 05/16/2015  . History of cesarean section complicating pregnancy 05/16/2015    Past Surgical History:  Procedure Laterality Date  . CESAREAN SECTION  02/17/2011 and 06/29/2013  . CESAREAN SECTION N/A 05/16/2015   Procedure: CESAREAN SECTION;  Surgeon: Conard Novak, MD;  Location: [facility] ORS;  Service: Obstetrics;  Laterality: N/A;  . CESAREAN SECTION N/A 12/03/2016   Procedure: CESAREAN SECTION;  Surgeon: Nadara Mustard, MD;  Location: [facility] ORS;  Service: Obstetrics;  Laterality: N/A;  Time-13:04, Gender-Girl Wgt-9lbs 15oz, Apgar- 9/9   . INDUCED ABORTION    . TONSILLECTOMY      Prior to  Admission medications   Medication Sig Start Date End Date Taking? Authorizing Provider  cephALEXin (KEFLEX) 500 MG capsule Take 1 capsule (500 mg total) by mouth 4 (four) times daily. 12/18/16   Conard Novak, MD  cephALEXin (KEFLEX) 500 MG capsule Take 1 capsule (500 mg total) by mouth 4 (four) times daily. 01/03/17   Joni Reining, PA-C  magic mouthwash w/lidocaine SOLN Take 5 mLs by mouth 4 (four) times daily. Oral solution slow swallow 4 times a day. 01/03/17   Joni Reining, PA-C  mupirocin cream Idelle Jo) 2 % Apply to affected area 3 times daily 01/03/17 01/03/18  Joni Reining, PA-C    Allergies Patient has no known allergies.  Family History  Problem Relation Age of Onset  . Hypertension Father     Social History Social History  Substance Use Topics  . Smoking status: Never Smoker  . Smokeless tobacco: Never Used  . Alcohol use No    Review of Systems  Constitutional: Fever  Eyes: No visual changes. ENT: Sore throat  Cardiovascular: Denies chest pain. Respiratory: Denies shortness of breath. Gastrointestinal: No abdominal pain.  No nausea, no vomiting.  No diarrhea.  No constipation. Genitourinary: Negative for dysuria. Musculoskeletal: Negative for back pain. Skin: Right facial rash. Neurological: Negative for headaches, focal weakness or numbness. Psychiatric:Anxiety and depression Endocrine:Hypertension and diabetes  ____________________________________________   PHYSICAL EXAM:  VITAL SIGNS: ED Triage Vitals  Enc Vitals Group     BP 01/03/17 1517 (!) 153/98     Pulse Rate 01/03/17 1517 (!) 124     Resp 01/03/17  1517 20     Temp 01/03/17 1517 99.7 F (37.6 C)     Temp src --      SpO2 01/03/17 1517 98 %     Weight --      Height 01/03/17 1518  (1.626 m)     Head Circumference --      Peak Flow --      Pain Score 01/03/17 1517 10     Pain Loc --      Pain Edu? --      Excl. in GC? --     Constitutional: Alert and oriented. Well  appearing and in no acute distress. Mouth/Throat: Mucous membranes are moist.  Oropharynx erythematous. Status post tonsillectomy Hematological/Lymphatic/Immunilogical: No cervical lymphadenopathy. Cardiovascular: Normal rate, regular rhythm. Grossly normal heart sounds.  Good peripheral circulation. Respiratory: Normal respiratory effort.  No retractions. Lungs CTAB. Neurologic:  Normal speech and language. No gross focal neurologic deficits are appreciated. No gait instability. Skin:  crusted lesion right facial area on erythematous base. Psychiatric: Mood and affect are normal. Speech and behavior are normal.  ____________________________________________   LABS (all labs ordered are listed, but only abnormal results are displayed)  Labs Reviewed  POCT RAPID STREP A   ____________________________________________  EKG   ____________________________________________  RADIOLOGY  No results found.  ____________________________________________   PROCEDURES  Procedure(s) performed: None  Procedures  Critical Care performed: No  ____________________________________________   INITIAL IMPRESSION / ASSESSMENT AND PLAN / ED COURSE  Pertinent labs & imaging results that were available during my care of the patient were reviewed by me and considered in my medical decision making (see chart for details).  Viral pharyngitis and bacterial skin infection. Patient advises strep culture is pending. Patient given discharge Instructions and advised take medication as directed.      ____________________________________________   FINAL CLINICAL IMPRESSION(S) / ED DIAGNOSES  Final diagnoses:  Viral pharyngitis  Impetigo      NEW MEDICATIONS STARTED DURING THIS VISIT:  New Prescriptions   CEPHALEXIN (KEFLEX) 500 MG CAPSULE    Take 1 capsule (500 mg total) by mouth 4 (four) times daily.   MAGIC MOUTHWASH W/LIDOCAINE SOLN    Take 5 mLs by mouth 4 (four) times daily. Oral  solution slow swallow 4 times a day.   MUPIROCIN CREAM (BACTROBAN) 2 %    Apply to affected area 3 times daily     Note:  This document was prepared using Dragon voice recognition software and may include unintentional dictation errors.    Joni Reining, PA-C 01/03/17 1634    Governor Rooks, MD 01/04/17 [phone]

## 2017-01-14 ENCOUNTER — Encounter: Payer: Self-pay | Admitting: Obstetrics & Gynecology

## 2017-01-14 ENCOUNTER — Ambulatory Visit (INDEPENDENT_AMBULATORY_CARE_PROVIDER_SITE_OTHER): Payer: Medicaid Other | Admitting: Obstetrics & Gynecology

## 2017-01-14 DIAGNOSIS — O24415 Gestational diabetes mellitus in pregnancy, controlled by oral hypoglycemic drugs: Secondary | ICD-10-CM

## 2017-01-14 NOTE — Progress Notes (Signed)
  OBSTETRICS POSTPARTUM CLINIC PROGRESS NOTE  Subjective:     Natasha Davidson is a 26 y.o. [id number] female who presents for a postpartum visit. She is 6 weeks postpartum following a Term pregnancy or Pregnancy complicated by: prior CS, Obesity, Diabetes, Polyhydramnios, Desire for sterility and delivery by C-section.  I have fully reviewed the prenatal and intrapartum course. Anesthesia: spinal.  Postpartum course has been complicated by uncomplicated.  Baby is feeding by Bottle.  Bleeding: patient has not  resumed menses.  Bowel function is normal. Bladder function is normal.  Patient is not sexually active. Contraception method desired is tubal ligation.  Postpartum depression screening: negative. Edinburgh 8. PAP 05/2016 normal The following portions of the patient's history were reviewed and updated as appropriate: allergies, current medications, past family history, past medical history, past social history, past surgical history and problem list.  Review of Systems Pertinent items are noted in HPI.  Objective:    BP 120/80   Pulse 95   Ht  (1.651 m)   Wt 257 lb (116.6 kg)   BMI 42.77 kg/m   General:  alert and no distress   Breasts:  inspection negative, no nipple discharge or bleeding, no masses or nodularity palpable  Lungs: clear to auscultation bilaterally  Heart:  regular rate and rhythm, S1, S2 normal, no murmur, click, rub or gallop  Abdomen: soft, non-tender; bowel sounds normal; no masses,  no organomegaly.  Well healed Pfannenstiel incision   Vulva:  normal  Vagina: normal vagina, no discharge, exudate, lesion, or erythema  Cervix:  no cervical motion tenderness and no lesions  Corpus: normal size, contour, position, consistency, mobility, non-tender  Adnexa:  normal adnexa and no mass, fullness, tenderness  Rectal Exam: Not performed.          Assessment:  Post Partum Care visit 1. Postpartum care following cesarean delivery  2. Oral hypoglycemic  controlled White classification A2 gestational diabetes mellitus (GDM) - Sch f/u DM testing - Glucose tolerance, 2 hours; Future   Plan:  See orders and Patient Instructions Resume all normal activities Follow up in: 5 months or as needed.   Annamarie Major, MD, [facility], [facility] 01/14/2017  2:52 PM

## 2017-02-19 ENCOUNTER — Ambulatory Visit: Payer: Medicaid Other | Admitting: Obstetrics and Gynecology

## 2017-04-18 ENCOUNTER — Ambulatory Visit: Payer: Medicaid Other | Admitting: Obstetrics and Gynecology

## 2017-06-06 ENCOUNTER — Encounter: Payer: Self-pay | Admitting: Emergency Medicine

## 2017-06-06 ENCOUNTER — Emergency Department
Admission: EM | Admit: 2017-06-06 | Discharge: 2017-06-06 | Disposition: A | Discharge status: Home / Self Care | Payer: Medicaid Other | Attending: Emergency Medicine | Admitting: Emergency Medicine

## 2017-06-06 ENCOUNTER — Other Ambulatory Visit: Payer: Self-pay

## 2017-06-06 DIAGNOSIS — I10 Essential (primary) hypertension: Secondary | ICD-10-CM | POA: Insufficient documentation

## 2017-06-06 DIAGNOSIS — J069 Acute upper respiratory infection, unspecified: Secondary | ICD-10-CM

## 2017-06-06 DIAGNOSIS — E119 Type 2 diabetes mellitus without complications: Secondary | ICD-10-CM | POA: Insufficient documentation

## 2017-06-06 MED ORDER — GUAIFENESIN-CODEINE 100-10 MG/5ML PO SOLN: 10.0000 mL | Freq: Three times a day (TID) | ORAL | 0 refills | Status: DC | PRN

## 2017-06-06 NOTE — ED Notes (Signed)
Pt c/o body aches, chills and sweats. Denies fevers that she knows of. Ambulatory in room with no problem. Took otc robitussin with no relief per patient

## 2017-06-06 NOTE — ED Triage Notes (Signed)
Presents with body aches,cough and chills   sxs' started yesterday

## 2017-06-06 NOTE — ED Provider Notes (Signed)
[facility] Emergency Department Provider Note  ____________________________________________  Time seen: Approximately 11:05 AM  I have reviewed the triage vital signs and the nursing notes.   HISTORY  Chief Complaint Cough and Generalized Body Aches   HPI Natasha Davidson is a 27 y.o. female who presents to the emergency department for evaluation and treatment of body aches, chills, and sweats that started last night.  No relief with Robitussin.  Patient denies fever.  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  She did have her flu shot this year.  Past Medical History:  Diagnosis Date  . Anxiety   . Depression   . Diabetes mellitus without complication (HCC)   . Gestational diabetes   . Hypertension    pih  . Obesity affecting pregnancy     Patient Active Problem List   Diagnosis Date Noted  . Non-reactive NST (non-stress test) 12/02/2016  . History of chronic hypertension 11/20/2016  . Oral hypoglycemic controlled White classification A2 gestational diabetes mellitus (GDM) 11/04/2016  . BMI 40.0-44.9, adult (HCC) 05/16/2015    Past Surgical History:  Procedure Laterality Date  . CESAREAN SECTION  02/17/2011 and 06/29/2013  . CESAREAN SECTION N/A 05/16/2015   Procedure: CESAREAN SECTION;  Surgeon: Conard Novak, MD;  Location: [facility] ORS;  Service: Obstetrics;  Laterality: N/A;  . CESAREAN SECTION N/A 12/03/2016   Procedure: CESAREAN SECTION;  Surgeon: Nadara Mustard, MD;  Location: [facility] ORS;  Service: Obstetrics;  Laterality: N/A;  Time-13:04, Gender-Girl Wgt-9lbs 15oz, Apgar- 9/9   . INDUCED ABORTION    . TONSILLECTOMY      Prior to Admission medications   Medication Sig Start Date End Date Taking? Authorizing Provider  cephALEXin (KEFLEX) 500 MG capsule Take 1 capsule (500 mg total) by mouth 4 (four) times daily. 12/18/16   Conard Novak, MD  cephALEXin (KEFLEX) 500 MG capsule Take 1  capsule (500 mg total) by mouth 4 (four) times daily. Patient not taking: Reported on 01/14/2017 01/03/17   Joni Reining, PA-C  guaiFENesin-codeine 100-10 MG/5ML syrup Take 10 mLs by mouth 3 (three) times daily as needed. 06/06/17   Zaira Iacovelli, Rulon Eisenmenger B, FNP  magic mouthwash w/lidocaine SOLN Take 5 mLs by mouth 4 (four) times daily. Oral solution slow swallow 4 times a day. Patient not taking: Reported on 01/14/2017 01/03/17   Joni Reining, PA-C  mupirocin cream Idelle Jo) 2 % Apply to affected area 3 times daily Patient not taking: Reported on 01/14/2017 01/03/17 01/03/18  Joni Reining, PA-C    Allergies Patient has no known allergies.  Family History  Problem Relation Age of Onset  . Hypertension Father     Social History Social History   Tobacco Use  . Smoking status: Never Smoker  . Smokeless tobacco: Never Used  Substance Use Topics  . Alcohol use: No  . Drug use: No    Review of Systems Constitutional: Negative for fever/chills ENT: Negative for sore throat. Cardiovascular: Denies chest pain. Respiratory: Negative for shortness of breath.  Positive for cough. Gastrointestinal: Negative for nausea, no vomiting.  Negative for diarrhea.  Musculoskeletal: Negative for body aches Skin: Negative for rash. Neurological: Negative for headaches ____________________________________________   PHYSICAL EXAM:  VITAL SIGNS: ED Triage Vitals  Enc Vitals Group     BP 06/06/17 1002 112/71     Pulse Rate 06/06/17 1002 95     Resp 06/06/17 1002 20  Temp 06/06/17 1002 98.2 F (36.8 C)     Temp Source 06/06/17 1002 Oral     SpO2 06/06/17 1002 99 %     Weight 06/06/17 1001 262 lb (118.8 kg)     Height 06/06/17 1001 5\' 4"  (1.626 m)     Head Circumference --      Peak Flow --      Pain Score 06/06/17 1000 8     Pain Loc --      Pain Edu? --      Excl. in GC? --     Constitutional: Alert and oriented.  Acutely ill appearing and in no acute distress. Eyes: Conjunctivae are  normal. EOMI. Ears: Bilateral tympanic membranes are normal Nose: Sinus congestion noted; clear rhinnorhea. Mouth/Throat: Mucous membranes are moist.  Oropharynx mildly erythematous. Tonsils 1+ without exudate. Neck: No stridor.  Lymphatic: Bilateral anterior cervical lymphadenopathy. Cardiovascular: Normal rate, regular rhythm. Good peripheral circulation. Respiratory: Normal respiratory effort.  No retractions.  Breath sounds are clear throughout. Gastrointestinal: Soft and nontender.  Musculoskeletal: FROM x 4 extremities.  Neurologic:  Normal speech and language.  Skin:  Skin is warm, dry and intact. No rash noted. Psychiatric: Mood and affect are normal. Speech and behavior are normal.  ____________________________________________   LABS (all labs ordered are listed, but only abnormal results are displayed)  Labs Reviewed - No data to display ____________________________________________  EKG  Not indicated ____________________________________________  RADIOLOGY  Not indicated ____________________________________________   PROCEDURES  Procedure(s) performed: None  Critical Care performed: No ____________________________________________   INITIAL IMPRESSION / ASSESSMENT AND PLAN / ED COURSE  27 year old female presenting to the emergency department for evaluation and treatment of symptoms and exam most consistent with a viral URI.  She will be given a prescription for guaifenesin with codeine and encouraged to rest and stay hydrated.  She was encouraged to follow-up with her primary care provider for symptoms that last for more than 5 days.  She was advised to return to the emergency department for symptoms of change or worsen if she is unable to schedule an appointment.  A work excuse was provided for today and tomorrow.  Medications - No data to display  ED Discharge Orders        Ordered    guaiFENesin-codeine 100-10 MG/5ML syrup  3 times daily PRN     06/06/17  1117      Pertinent labs & imaging results that were available during my care of the patient were reviewed by me and considered in my medical decision making (see chart for details).    If controlled substance prescribed during this visit, 12 month history viewed on the [facility] prior to issuing an initial prescription for Schedule II or III opiod. ____________________________________________   FINAL CLINICAL IMPRESSION(S) / ED DIAGNOSES  Final diagnoses:  Viral upper respiratory tract infection    Note:  This document was prepared using Dragon voice recognition software and may include unintentional dictation errors.     Chinita Pesterriplett, Prakash Kimberling B, FNP 06/06/17 1608    Minna AntisPaduchowski, Kevin, MD 06/10/17 1512

## 2017-06-18 ENCOUNTER — Ambulatory Visit (INDEPENDENT_AMBULATORY_CARE_PROVIDER_SITE_OTHER): Payer: Medicaid Other | Admitting: Obstetrics and Gynecology

## 2017-06-18 ENCOUNTER — Encounter: Payer: Self-pay | Admitting: Obstetrics and Gynecology

## 2017-06-18 VITALS — BP 138/94 | Ht 64.0 in

## 2017-06-18 DIAGNOSIS — B372 Candidiasis of skin and nail: Secondary | ICD-10-CM

## 2017-06-18 MED ORDER — NYSTATIN 100000 UNIT/GM EX POWD
Freq: Three times a day (TID) | CUTANEOUS | 1 refills | Status: DC
Start: 2017-06-18 — End: 2018-08-25

## 2017-06-18 NOTE — Progress Notes (Signed)
Obstetrics & Gynecology Office Visit    Chief Complaint  Patient presents with  . Wound Check    History of Present Illness: 27 y.o. [id number] female who is status post c-section on 12/03/17.  She note that her incision frequently gets irritated. It get red and splotchy and becomes itchy.  It also gets warm. There is sometimes an odor.  She has not seen pus coming from the incision.  She notes no opening in the incision. She denies bleeding from the incision. She is tolerating PO, denies fevers, chills. She is having regular bowel movement and has no difficulty voiding.  She starting having this issue at about 6 weeks postpartum.  Denies nausea and vomiting.    Past Medical History:  Diagnosis Date  . Anxiety   . Depression   . Diabetes mellitus without complication (HCC)   . Gestational diabetes   . Hypertension    pih  . Obesity affecting pregnancy     Past Surgical History:  Procedure Laterality Date  . CESAREAN SECTION  02/17/2011 and 06/29/2013  . CESAREAN SECTION N/A 05/16/2015   Procedure: CESAREAN SECTION;  Surgeon: Conard NovakStephen D Ladell Lea, MD;  Location: [facility] ORS;  Service: Obstetrics;  Laterality: N/A;  . CESAREAN SECTION N/A 12/03/2016   Procedure: CESAREAN SECTION;  Surgeon: Nadara MustardHarris, Robert P, MD;  Location: [facility] ORS;  Service: Obstetrics;  Laterality: N/A;  Time-13:04, Gender-Girl Wgt-9lbs 15oz, Apgar- 9/9   . INDUCED ABORTION    . TONSILLECTOMY      Gynecologic History: Patient's last menstrual period was 05/18/2017.  Obstetric History: [id number]  Family History  Problem Relation Age of Onset  . Hypertension Father     Social History   Socioeconomic History  . Marital status: Single    Spouse name: Not on file  . Number of children: 3  . Years of education: Not on file  . Highest education level: Not on file  Social Needs  . Financial resource strain: Not on file  . Food insecurity - worry: Not on file  . Food insecurity - inability: Not on file  .  Transportation needs - medical: Not on file  . Transportation needs - non-medical: Not on file  Occupational History  . Not on file  Tobacco Use  . Smoking status: Never Smoker  . Smokeless tobacco: Never Used  Substance and Sexual Activity  . Alcohol use: No  . Drug use: No  . Sexual activity: Yes    Birth control/protection: Surgical  Other Topics Concern  . Not on file  Social History Narrative  . Not on file   Allergies: No Known Allergies  Medications: denies    Review of Systems  Constitutional: Negative.   HENT: Negative.   Eyes: Negative.   Respiratory: Negative.   Cardiovascular: Negative.   Gastrointestinal: Negative.   Genitourinary: Negative.   Musculoskeletal: Negative.   Skin:       See HPI  Neurological: Negative.   Psychiatric/Behavioral: Negative.      Physical Exam BP (!) 138/94   Ht 5\' 4"  (1.626 m)   LMP 05/18/2017   BMI 44.97 kg/m  Patient's last menstrual period was 05/18/2017. Physical Exam  Constitutional: She is oriented to person, place, and time. She appears well-developed and well-nourished. No distress.  HENT:  Head: Normocephalic and atraumatic.  Eyes: Conjunctivae are normal. No scleral icterus.  Cardiovascular: Normal rate and regular rhythm.  Pulmonary/Chest: Effort normal and breath sounds normal.  Abdominal: Soft. Bowel sounds are normal. She exhibits no distension.  There is no tenderness. There is no guarding.  Incision is clean, intact. There is mild erythema along the right aspect (about 4cm) without induration, there is mild warmth, there is no tenderness. There left aspect is clean, dry, and intact, without erythema, induration, warmth, and tenderness.  Musculoskeletal: Normal range of motion. She exhibits no edema.  Neurological: She is alert and oriented to person, place, and time. No cranial nerve deficit.  Psychiatric: She has a normal mood and affect. Her behavior is normal. Judgment normal.    Female chaperone  present for pelvic and breast  portions of the physical exam  Assessment: 27 y.o. [id number] female here for  1. Cutaneous candidiasis      Plan: Problem List Items Addressed This Visit    None    Visit Diagnoses    Cutaneous candidiasis    -  Primary   Relevant Medications   nystatin (MYCOSTATIN/NYSTOP) powder    Appears to be a cutaneous candidiasis. Will treat with nystatin. If symptom do not improve by early next week, patient to let me know. She was given instructions on how to keep the area clean and dry during treatment and long-term.   Thomasene Mohair, MD 06/19/2017 4:23 PM

## 2017-06-19 ENCOUNTER — Encounter: Payer: Self-pay | Admitting: Obstetrics and Gynecology

## 2017-07-03 IMAGING — US US TRANSVAGINAL NON-OB
1 series · 14 of 25 positions shown · non-contrast
Comparison: None

CLINICAL DATA: Lower abdominal pain and cramping.

EXAM:
TRANSABDOMINAL AND TRANSVAGINAL ULTRASOUND OF PELVIS
TECHNIQUE: Both transabdominal and transvaginal ultrasound examinations of the
pelvis were performed. Transabdominal technique was performed for
global imaging of the pelvis including uterus, ovaries, adnexal
regions, and pelvic cul-de-sac. It was necessary to proceed with
endovaginal exam following the transabdominal exam to visualize the
endometrium and ovaries.

[Series 1: us transvaginal non-ob · 0.22mm/px · 14 of 82 slices shown]
[im 1/82]
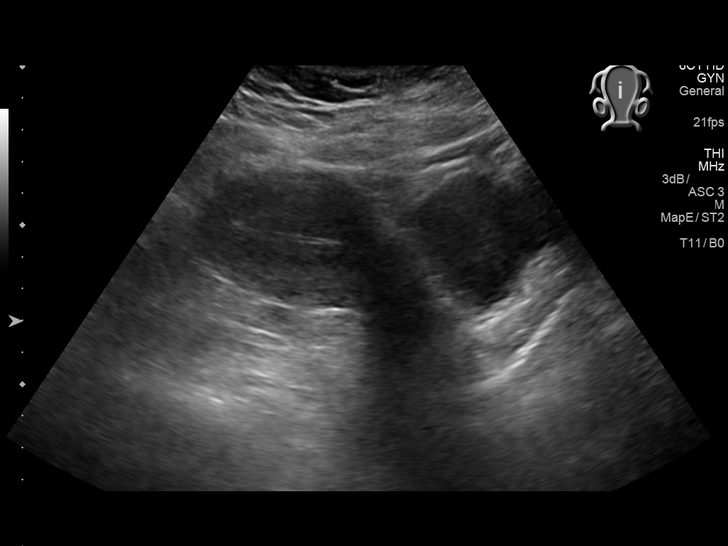
[im 7/82]
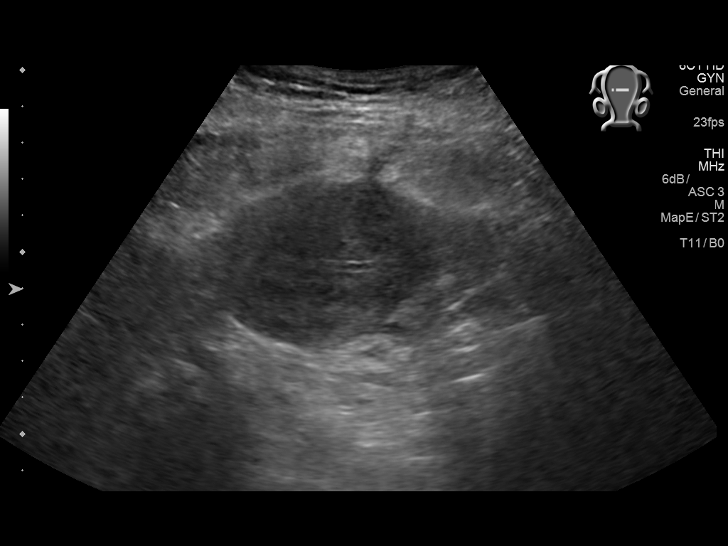
[im 14/82]
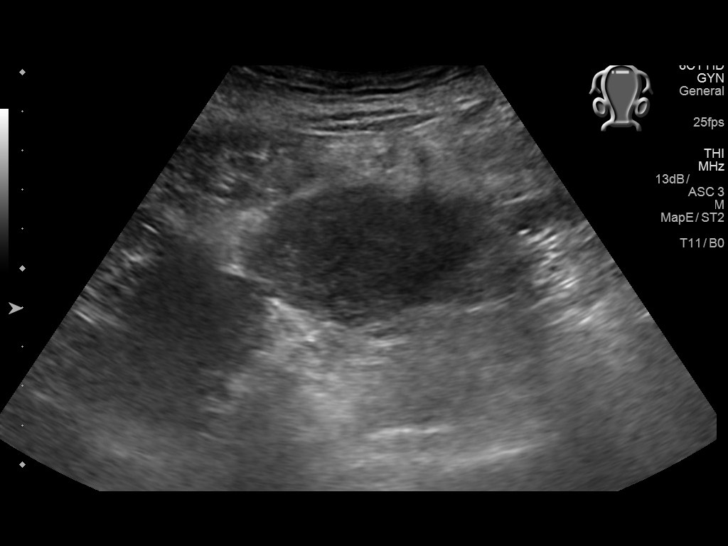
[im 21/82]
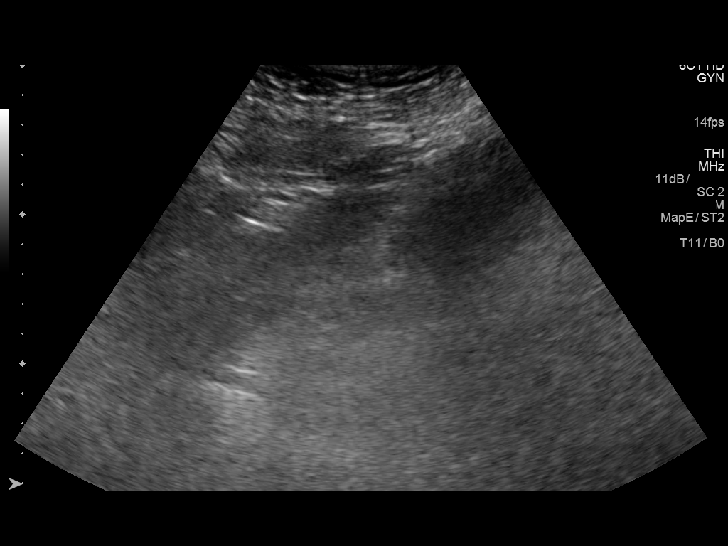
[im 28/82]
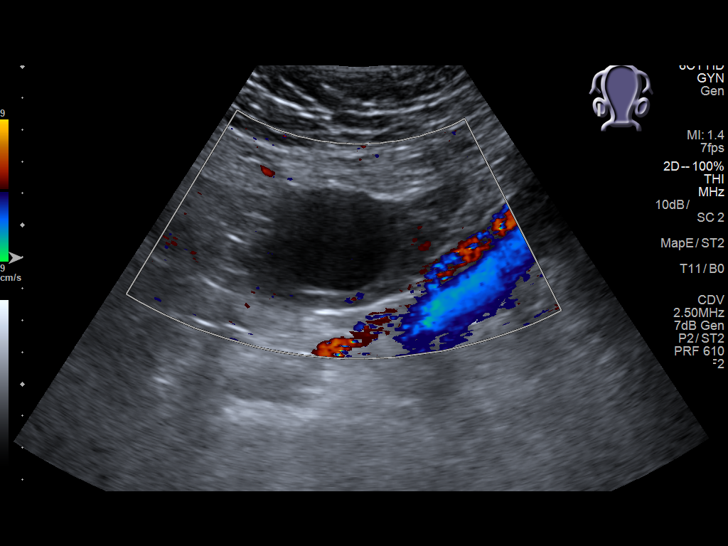
[im 31/82]
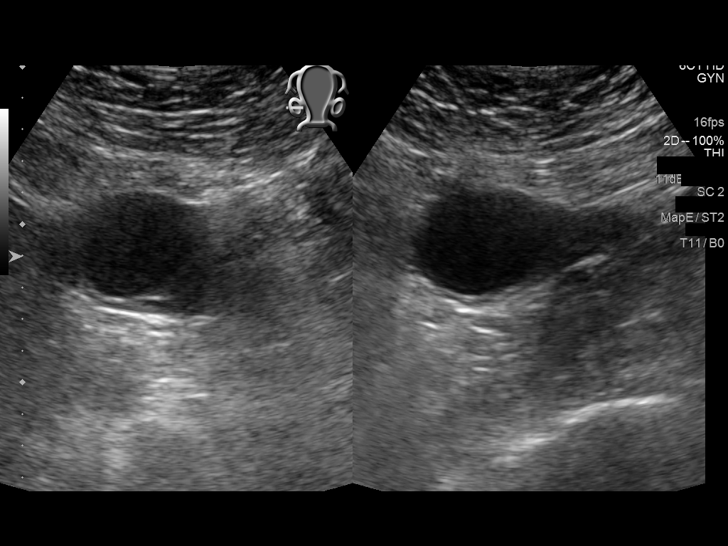
[im 38/82]
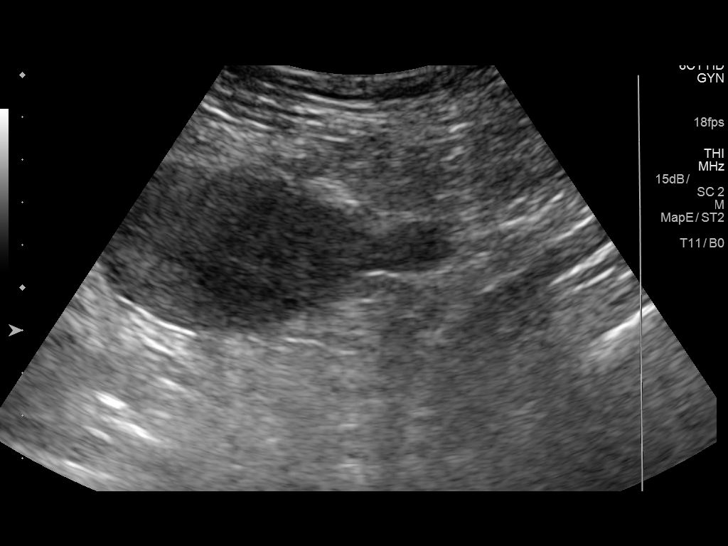
[im 44/82]
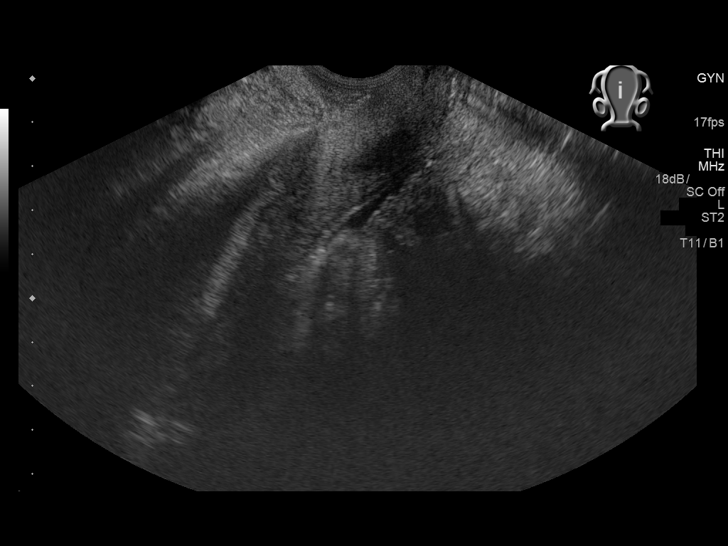
[im 51/82]
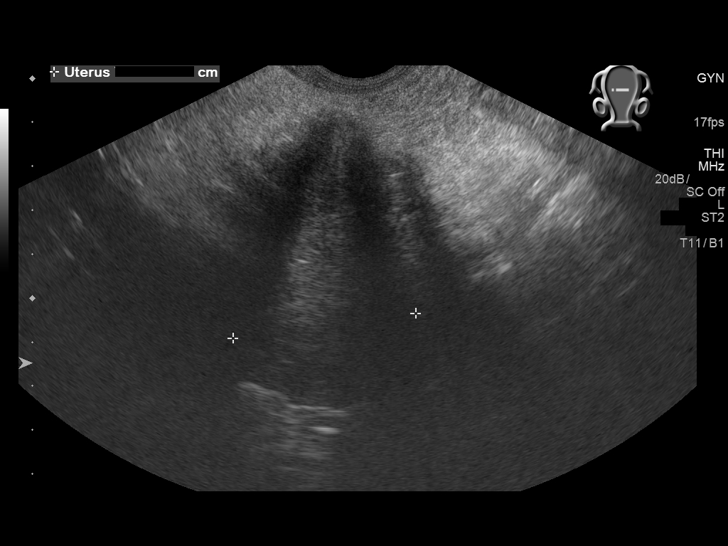
[im 55/82]
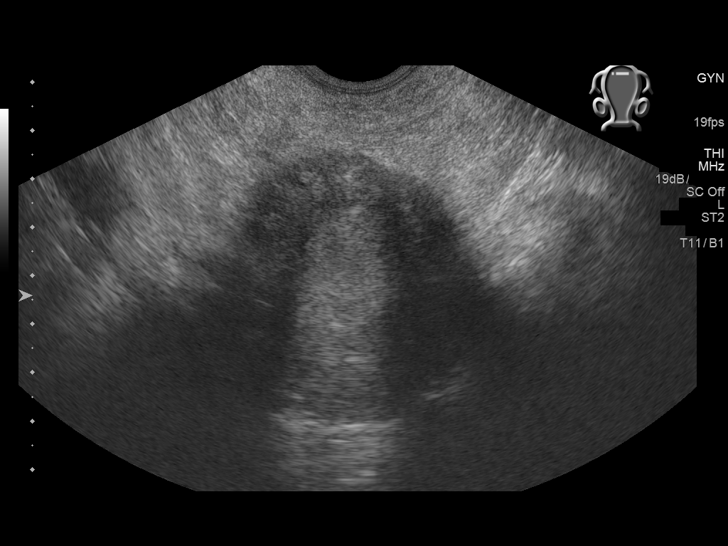
[im 61/82]
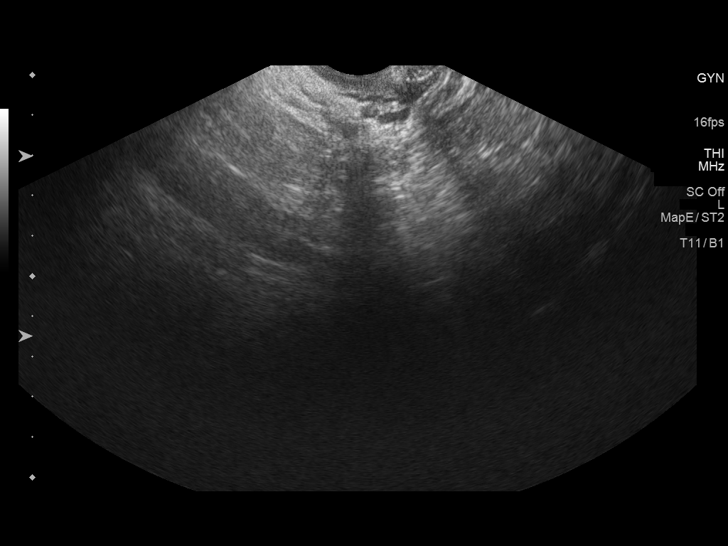
[im 68/82]
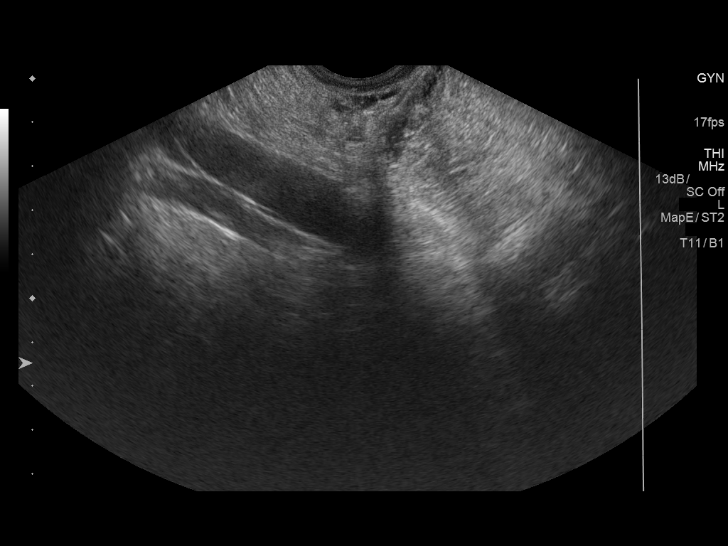
[im 75/82]
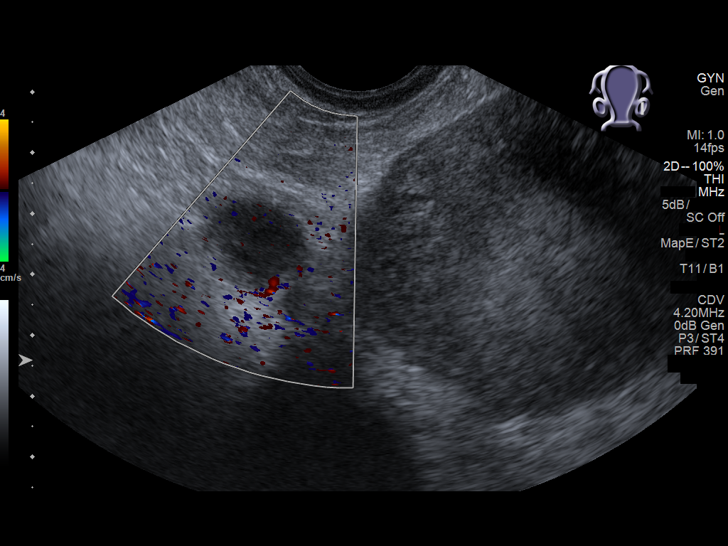
[im 82/82]
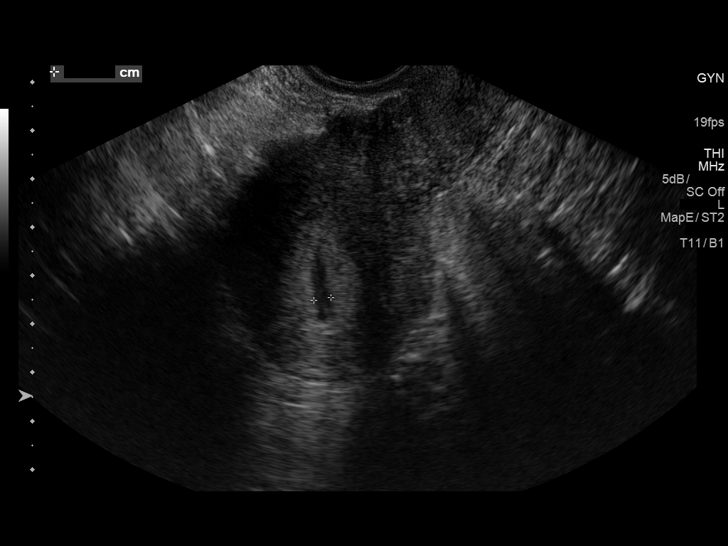

[14 of 25 positions shown; findings below may reference images not displayed]

FINDINGS: Uterus

Measurements: 10.6 x 4.3 x 6.2 cm. There is a small amount of fluid
in the endometrial canal.

Endometrium

Thickness: 14 mm.  There is fluid in the endometrial canal.

Right ovary

Measurements: 6.4 x 3.7 x 4.5 cm. There is a dominant follicle in
the right ovary measuring 4.4 x 3.4 x 3.5 cm.

Left ovary

Measurements: 2.5 x 1.6 x 2.3 cm. There is a 1.9 cm follicle in the
left ovary.

Other findings

No abnormal free fluid.
IMPRESSION: 1. There is a small amount of fluid in the endometrial canal which
is nonspecific.
2. Dominant follicles in both ovaries. Given their simple
appearance, no follow-up is necessary.

## 2017-09-08 ENCOUNTER — Ambulatory Visit: Payer: Medicaid Other | Admitting: Obstetrics and Gynecology

## 2017-09-10 ENCOUNTER — Ambulatory Visit: Payer: Medicaid Other | Admitting: Obstetrics & Gynecology

## 2017-10-14 ENCOUNTER — Ambulatory Visit: Payer: Medicaid Other | Admitting: Obstetrics and Gynecology

## 2017-10-23 ENCOUNTER — Encounter: Payer: Self-pay | Admitting: Emergency Medicine

## 2017-10-23 ENCOUNTER — Emergency Department
Admission: EM | Admit: 2017-10-23 | Discharge: 2017-10-23 | Disposition: A | Discharge status: Home / Self Care | Payer: Self-pay | Attending: Emergency Medicine | Admitting: Emergency Medicine

## 2017-10-23 ENCOUNTER — Other Ambulatory Visit: Payer: Self-pay

## 2017-10-23 ENCOUNTER — Emergency Department: Payer: Self-pay

## 2017-10-23 DIAGNOSIS — F329 Major depressive disorder, single episode, unspecified: Secondary | ICD-10-CM | POA: Insufficient documentation

## 2017-10-23 DIAGNOSIS — E119 Type 2 diabetes mellitus without complications: Secondary | ICD-10-CM | POA: Insufficient documentation

## 2017-10-23 DIAGNOSIS — F419 Anxiety disorder, unspecified: Secondary | ICD-10-CM | POA: Insufficient documentation

## 2017-10-23 DIAGNOSIS — J40 Bronchitis, not specified as acute or chronic: Secondary | ICD-10-CM | POA: Insufficient documentation

## 2017-10-23 MED ORDER — PSEUDOEPH-BROMPHEN-DM 30-2-10 MG/5ML PO SYRP: 5.0000 mL | ORAL_SOLUTION | Freq: Four times a day (QID) | ORAL | 0 refills | Status: DC | PRN

## 2017-10-23 MED ORDER — METHYLPREDNISOLONE 4 MG PO TBPK: ORAL_TABLET | ORAL | 0 refills | Status: DC

## 2017-10-23 NOTE — ED Provider Notes (Signed)
ED ECG REPORT I, Rockne MenghiniNorman, Anne-Caroline, the attending physician, personally viewed and interpreted this ECG.   Date: 10/23/2017  EKG Time: 1025  Rate: 119  Rhythm: sinus tachycardia  Axis: normal  Intervals:none  ST&T Change: No STEMI    Rockne MenghiniNorman, Anne-Caroline, MD 10/23/17 1028

## 2017-10-23 NOTE — ED Notes (Signed)
See triage note.  Presents with prod cough for couple of days  Unsure of fever  But afebrile on arrival

## 2017-10-23 NOTE — ED Triage Notes (Signed)
Pt reports yesterday started with productive cough with green sputum, sore throat, fever and chest pain with cough.

## 2017-10-23 NOTE — ED Provider Notes (Signed)
[facility] Emergency Department Provider Note   ____________________________________________   First MD Initiated Contact with Patient 10/23/17 1015     (approximate)  I have reviewed the triage vital signs and the nursing notes.   HISTORY  Chief Complaint Cough; Fever; and Chest Pain    HPI Natasha Davidson is a 27 y.o. female patient complaining o sore throat, productive cough, fever, and chest pain secondary to cough.  Patient denies dyspnea.  Patient is able to tolerate food and fluids.  Patient was tachycardic at triage.  Past Medical History:  Diagnosis Date  . Anxiety   . Depression   . Diabetes mellitus without complication (HCC)   . Gestational diabetes   . Hypertension    pih  . Obesity affecting pregnancy     Patient Active Problem List   Diagnosis Date Noted  . Non-reactive NST (non-stress test) 12/02/2016  . History of chronic hypertension 11/20/2016  . Oral hypoglycemic controlled White classification A2 gestational diabetes mellitus (GDM) 11/04/2016  . BMI 40.0-44.9, adult (HCC) 05/16/2015    Past Surgical History:  Procedure Laterality Date  . CESAREAN SECTION  02/17/2011 and 06/29/2013  . CESAREAN SECTION N/A 05/16/2015   Procedure: CESAREAN SECTION;  Surgeon: Conard Novak, MD;  Location: [facility] ORS;  Service: Obstetrics;  Laterality: N/A;  . CESAREAN SECTION N/A 12/03/2016   Procedure: CESAREAN SECTION;  Surgeon: Nadara Mustard, MD;  Location: [facility] ORS;  Service: Obstetrics;  Laterality: N/A;  Time-13:04, Gender-Girl Wgt-9lbs 15oz, Apgar- 9/9   . INDUCED ABORTION    . TONSILLECTOMY      Prior to Admission medications   Medication Sig Start Date End Date Taking? Authorizing Provider  brompheniramine-pseudoephedrine-DM 30-2-10 MG/5ML syrup Take 5 mLs by mouth 4 (four) times daily as needed. 10/23/17   Joni Reining, PA-C  methylPREDNISolone (MEDROL DOSEPAK) 4 MG TBPK tablet Take Tapered dose as directed 10/23/17   Joni Reining, PA-C  nystatin (MYCOSTATIN/NYSTOP) powder Apply topically 3 (three) times daily. 06/18/17   Conard Novak, MD    Allergies Patient has no known allergies.  Family History  Problem Relation Age of Onset  . Hypertension Father     Social History Social History   Tobacco Use  . Smoking status: Never Smoker  . Smokeless tobacco: Never Used  Substance Use Topics  . Alcohol use: No  . Drug use: No    Review of Systems  Constitutional: No fever/chills Eyes: No visual changes. ENT: Sore throat and nasal congestion. Cardiovascular: Denies chest pain. Respiratory: Denies shortness of breath.  Productive cough. Gastrointestinal: No abdominal pain.  No nausea, no vomiting.  No diarrhea.  No constipation. Genitourinary: Negative for dysuria. Musculoskeletal: Negative for back pain. Skin: Negative for rash. Neurological: Negative for headaches, focal weakness or numbness. Psychiatric:Anxiety and depression. Endocrine:Diabetes.  ____________________________________________   PHYSICAL EXAM:  VITAL SIGNS: ED Triage Vitals  Enc Vitals Group     BP 10/23/17 1035 128/72     Pulse Rate 10/23/17 1035 (!) 121     Resp 10/23/17 1035 18     Temp 10/23/17 1035 98.3 F (36.8 C)     Temp Source 10/23/17 1035 Oral     SpO2 10/23/17 1035 96 %     Weight 10/23/17 1013 260 lb (117.9 kg)     Height 10/23/17 1013 5\' 4"  (1.626 m)     Head Circumference --      Peak Flow --      Pain Score 10/23/17  1012 9     Pain Loc --      Pain Edu? --      Excl. in GC? --    Constitutional: Alert and oriented. Well appearing and in no acute distress. Eyes: Conjunctivae are normal. PERRL. EOMI. Nose: No congestion/rhinnorhea.  Edematous nasal turbinates. Mouth/Throat: Mucous membranes are moist.  Oropharynx erythematous. Neck: No stridor. Hematological/Lymphatic/Immunilogical: No cervical lymphadenopathy. Cardiovascular: Tachycardic, regular rhythm. Grossly normal heart sounds.  Good  peripheral circulation. Respiratory: Normal respiratory effort.  No retractions. Lungs CTAB. Gastrointestinal: Soft and nontender. No distention. No abdominal bruits. No CVA tenderness. Musculoskeletal: No lower extremity tenderness nor edema.  No joint effusions. Neurologic:  Normal speech and language. No gross focal neurologic deficits are appreciated. No gait instability. Skin:  Skin is warm, dry and intact. No rash noted. Psychiatric: Mood and affect are normal. Speech and behavior are normal.  ____________________________________________   LABS (all labs ordered are listed, but only abnormal results are displayed)  Labs Reviewed - No data to display ____________________________________________  EKG  Read by Dr. Sharma CovertNorman with no acute findings. ____________________________________________  RADIOLOGY  ED MD interpretation:   Official radiology report(s): Dg Chest 2 View  Result Date: 10/23/2017 CLINICAL DATA:  Productive cough, fever EXAM: CHEST - 2 VIEW COMPARISON:  02/04/2008 chest radiograph. FINDINGS: Stable cardiomediastinal silhouette with normal heart size. No pneumothorax. No pleural effusion. No pulmonary edema. No acute consolidative airspace disease. Peribronchial cuffing. IMPRESSION: Peribronchial cuffing, nonspecific, which can be seen with bronchitis or reactive airways disease. No acute consolidative airspace disease to suggest a pneumonia. Electronically Signed   By: Delbert PhenixJason A Poff M.D.   On: 10/23/2017 11:21    ____________________________________________   PROCEDURES  Procedure(s) performed:   Procedures  Critical Care performed:   ____________________________________________   INITIAL IMPRESSION / ASSESSMENT AND PLAN / ED COURSE  As part of my medical decision making, I reviewed the following data within the electronic MEDICAL RECORD NUMBER    Cough and congestion secondary to bronchitis.  Discussed x-ray findings with patient.  Patient given discharge  care instruction.  Patient advised take medication as directed.  Follow-up PCP.      ____________________________________________   FINAL CLINICAL IMPRESSION(S) / ED DIAGNOSES  Final diagnoses:  Bronchitis     ED Discharge Orders        Ordered    brompheniramine-pseudoephedrine-DM 30-2-10 MG/5ML syrup  4 times daily PRN     10/23/17 1155    methylPREDNISolone (MEDROL DOSEPAK) 4 MG TBPK tablet     10/23/17 1155       Note:  This document was prepared using Dragon voice recognition software and may include unintentional dictation errors.    Joni ReiningSmith, Ronald K, PA-C 10/23/17 1158    Jene EveryKinner, Robert, MD 10/23/17 1235

## 2017-12-26 ENCOUNTER — Ambulatory Visit: Payer: Medicaid Other | Admitting: Obstetrics and Gynecology

## 2018-06-02 ENCOUNTER — Emergency Department: Payer: Self-pay

## 2018-06-02 ENCOUNTER — Other Ambulatory Visit: Payer: Self-pay

## 2018-06-02 ENCOUNTER — Emergency Department
Admission: EM | Admit: 2018-06-02 | Discharge: 2018-06-02 | Disposition: A | Discharge status: Home / Self Care | Payer: Self-pay | Attending: Emergency Medicine | Admitting: Emergency Medicine

## 2018-06-02 ENCOUNTER — Encounter: Payer: Self-pay | Admitting: Emergency Medicine

## 2018-06-02 DIAGNOSIS — Z79899 Other long term (current) drug therapy: Secondary | ICD-10-CM | POA: Insufficient documentation

## 2018-06-02 DIAGNOSIS — R7611 Nonspecific reaction to tuberculin skin test without active tuberculosis: Secondary | ICD-10-CM | POA: Insufficient documentation

## 2018-06-02 DIAGNOSIS — F17210 Nicotine dependence, cigarettes, uncomplicated: Secondary | ICD-10-CM | POA: Insufficient documentation

## 2018-06-02 DIAGNOSIS — E119 Type 2 diabetes mellitus without complications: Secondary | ICD-10-CM | POA: Insufficient documentation

## 2018-06-02 DIAGNOSIS — I10 Essential (primary) hypertension: Secondary | ICD-10-CM | POA: Insufficient documentation

## 2018-06-02 DIAGNOSIS — Z711 Person with feared health complaint in whom no diagnosis is made: Secondary | ICD-10-CM | POA: Insufficient documentation

## 2018-06-02 NOTE — Discharge Instructions (Addendum)
Your chest x-ray was negative for TB.  Follow-up with [facility] department tell me had a positive skin test but negative chest x-ray.

## 2018-06-02 NOTE — ED Triage Notes (Signed)
Pt presents to ED via POV. Was told by employer to get checked out for positive TB skin test. Pt denies any s/sx of tb: no night sweats, cough, sob, chest pain. Pt denies any other complaints at this time. States her skin test has been positive before and TB was ruled out by CXR.

## 2018-06-02 NOTE — ED Provider Notes (Signed)
[facility] Emergency Department Provider Note  ____________________________________________   First MD Initiated Contact with Patient 06/02/18 1525     (approximate)  I have reviewed the triage vital signs and the nursing notes.   HISTORY  Chief Complaint Abnormal Lab (TB skin test)    HPI Natasha Davidson is a 28 y.o. female presents emergency department stating her employer told her to come here and get a chest x-ray.  She states she had a positive PPD she states that when the PPD was placed she had a large amount of bruising and that the area had bubbled up immediately.  She states she does not have a fever, cough, or night sweats.    Past Medical History:  Diagnosis Date  . Anxiety   . Depression   . Diabetes mellitus without complication (HCC)   . Gestational diabetes   . Hypertension    pih  . Obesity affecting pregnancy     Patient Active Problem List   Diagnosis Date Noted  . Non-reactive NST (non-stress test) 12/02/2016  . History of chronic hypertension 11/20/2016  . Oral hypoglycemic controlled White classification A2 gestational diabetes mellitus (GDM) 11/04/2016  . BMI 40.0-44.9, adult (HCC) 05/16/2015    Past Surgical History:  Procedure Laterality Date  . CESAREAN SECTION  02/17/2011 and 06/29/2013  . CESAREAN SECTION N/A 05/16/2015   Procedure: CESAREAN SECTION;  Surgeon: Conard Novak, MD;  Location: [facility] ORS;  Service: Obstetrics;  Laterality: N/A;  . CESAREAN SECTION N/A 12/03/2016   Procedure: CESAREAN SECTION;  Surgeon: Nadara Mustard, MD;  Location: [facility] ORS;  Service: Obstetrics;  Laterality: N/A;  Time-13:04, Gender-Girl Wgt-9lbs 15oz, Apgar- 9/9   . INDUCED ABORTION    . TONSILLECTOMY      Prior to Admission medications   Medication Sig Start Date End Date Taking? Authorizing Provider  brompheniramine-pseudoephedrine-DM 30-2-10 MG/5ML syrup Take 5 mLs by mouth 4 (four) times daily as needed. 10/23/17   Joni Reining, PA-C  nystatin (MYCOSTATIN/NYSTOP) powder Apply topically 3 (three) times daily. 06/18/17   Conard Novak, MD    Allergies Patient has no known allergies.  Family History  Problem Relation Age of Onset  . Hypertension Father     Social History Social History   Tobacco Use  . Smoking status: Current Every Day Smoker    Packs/day: 0.50    Types: Cigarettes  . Smokeless tobacco: Never Used  Substance Use Topics  . Alcohol use: No  . Drug use: No    Review of Systems  Constitutional: No fever/chills Eyes: No visual changes. ENT: No sore throat. Respiratory: Denies cough Genitourinary: Negative for dysuria. Musculoskeletal: Negative for back pain. Skin: Negative for rash.  Positive for PPD reaction    ____________________________________________   PHYSICAL EXAM:  VITAL SIGNS: ED Triage Vitals  Enc Vitals Group     BP 06/02/18 1411 135/90     Pulse Rate 06/02/18 1411 99     Resp 06/02/18 1411 18     Temp --      Temp src --      SpO2 06/02/18 1411 99 %     Weight 06/02/18 1411 265 lb (120.2 kg)     Height 06/02/18 1411 5\' 4"  (1.626 m)     Head Circumference --      Peak Flow --      Pain Score 06/02/18 1416 0     Pain Loc --      Pain Edu? --  Excl. in GC? --     Constitutional: Alert and oriented. Well appearing and in no acute distress. Eyes: Conjunctivae are normal.  Head: Atraumatic. Nose: No congestion/rhinnorhea. Mouth/Throat: Mucous membranes are moist.   Neck:  supple no lymphadenopathy noted Cardiovascular: Normal rate, regular rhythm. Heart sounds are normal Respiratory: Normal respiratory effort.  No retractions, lungs c t a  GU: deferred Musculoskeletal: FROM all extremities, warm and well perfused Neurologic:  Normal speech and language.  Skin:  Skin is warm, dry and intact. No rash noted. Psychiatric: Mood and affect are normal. Speech and behavior are normal.  ____________________________________________    LABS (all labs ordered are listed, but only abnormal results are displayed)  Labs Reviewed - No data to display ____________________________________________   ____________________________________________  RADIOLOGY  Chest x-ray is normal  ____________________________________________   PROCEDURES  Procedure(s) performed: No  Procedures    ____________________________________________   INITIAL IMPRESSION / ASSESSMENT AND PLAN / ED COURSE  Pertinent labs & imaging results that were available during my care of the patient were reviewed by me and considered in my medical decision making (see chart for details).   Patient is 28 year old female presents emergency department due to a positive PPD.  Physical exam shows patient appears very well.  She does not have a cough.  Lungs are clear to all station.  Chest x-ray is normal  Explained all this to the patient.  Due to the placement of the PPD him the way it feels at this time it may have been due to the and accurate placement.  However I instructed her to go ahead and called the [facility] health department to see if she needs to do anything further.  She states she understands and will comply.  She was discharged in stable condition with a note for her employer stating that her chest x-ray was negative.     As part of my medical decision making, I reviewed the following data within the electronic MEDICAL RECORD NUMBER Nursing notes reviewed and incorporated, Old chart reviewed, Radiograph reviewed chest x-ray is normal, Notes from prior ED visits and [place] Controlled Substance Database  ____________________________________________   FINAL CLINICAL IMPRESSION(S) / ED DIAGNOSES  Final diagnoses:  Feared complaint without diagnosis      NEW MEDICATIONS STARTED DURING THIS VISIT:  Discharge Medication List as of 06/02/2018  3:32 PM       Note:  This document was prepared using Dragon voice recognition software and  may include unintentional dictation errors.    Faythe Ghee, PA-C 06/02/18 1638    Emily Filbert, MD 06/03/18 [phone]

## 2018-06-02 NOTE — ED Notes (Signed)
See triage note  Denies any new complaints    

## 2018-06-05 ENCOUNTER — Emergency Department
Admission: EM | Admit: 2018-06-05 | Discharge: 2018-06-05 | Disposition: A | Discharge status: Home / Self Care | Payer: Self-pay | Attending: Emergency Medicine | Admitting: Emergency Medicine

## 2018-06-05 ENCOUNTER — Other Ambulatory Visit: Payer: Self-pay

## 2018-06-05 DIAGNOSIS — F17210 Nicotine dependence, cigarettes, uncomplicated: Secondary | ICD-10-CM | POA: Insufficient documentation

## 2018-06-05 DIAGNOSIS — J029 Acute pharyngitis, unspecified: Secondary | ICD-10-CM

## 2018-06-05 DIAGNOSIS — J111 Influenza due to unidentified influenza virus with other respiratory manifestations: Secondary | ICD-10-CM | POA: Insufficient documentation

## 2018-06-05 DIAGNOSIS — E119 Type 2 diabetes mellitus without complications: Secondary | ICD-10-CM | POA: Insufficient documentation

## 2018-06-05 DIAGNOSIS — Z79899 Other long term (current) drug therapy: Secondary | ICD-10-CM | POA: Insufficient documentation

## 2018-06-05 DIAGNOSIS — I10 Essential (primary) hypertension: Secondary | ICD-10-CM | POA: Insufficient documentation

## 2018-06-05 DIAGNOSIS — R69 Illness, unspecified: Secondary | ICD-10-CM

## 2018-06-05 LAB — GROUP A STREP BY PCR: Group A Strep by PCR: NOT DETECTED

## 2018-06-05 LAB — INFLUENZA PANEL BY PCR (TYPE A & B)
INFLAPCR: NEGATIVE
Influenza B By PCR: NEGATIVE

## 2018-06-05 MED ORDER — LIDOCAINE VISCOUS HCL 2 % MT SOLN: 5.0000 mL | Freq: Four times a day (QID) | OROMUCOSAL | 0 refills | Status: DC | PRN

## 2018-06-05 MED ORDER — IBUPROFEN 600 MG PO TABS: 600.0000 mg | ORAL_TABLET | Freq: Three times a day (TID) | ORAL | 0 refills | Status: DC | PRN

## 2018-06-05 MED ORDER — PSEUDOEPH-BROMPHEN-DM 30-2-10 MG/5ML PO SYRP: 5.0000 mL | ORAL_SOLUTION | Freq: Four times a day (QID) | ORAL | 0 refills | Status: DC | PRN

## 2018-06-05 NOTE — ED Provider Notes (Signed)
[facility] Emergency Department Provider Note   ____________________________________________   First MD Initiated Contact with Patient 06/05/18 1121     (approximate)  I have reviewed the triage vital signs and the nursing notes.   HISTORY  Chief Complaint URI    HPI Natasha Davidson is a 28 y.o. female patient presents with cough, sore throat, body aches, and fever started yesterday.  Patient also complained of nasal congestion.  Patient state nausea but no vomiting or diarrhea.  Patient has not taken flu shot for this season.  Patient rates her pain as a 8/10.  Patient described the pain as "sore/achy".  No palliative measure for complaint.   Past Medical History:  Diagnosis Date  . Anxiety   . Depression   . Diabetes mellitus without complication (HCC)   . Gestational diabetes   . Hypertension    pih  . Obesity affecting pregnancy     Patient Active Problem List   Diagnosis Date Noted  . Non-reactive NST (non-stress test) 12/02/2016  . History of chronic hypertension 11/20/2016  . Oral hypoglycemic controlled White classification A2 gestational diabetes mellitus (GDM) 11/04/2016  . BMI 40.0-44.9, adult (HCC) 05/16/2015    Past Surgical History:  Procedure Laterality Date  . CESAREAN SECTION  02/17/2011 and 06/29/2013  . CESAREAN SECTION N/A 05/16/2015   Procedure: CESAREAN SECTION;  Surgeon: Conard Novak, MD;  Location: [facility] ORS;  Service: Obstetrics;  Laterality: N/A;  . CESAREAN SECTION N/A 12/03/2016   Procedure: CESAREAN SECTION;  Surgeon: Nadara Mustard, MD;  Location: [facility] ORS;  Service: Obstetrics;  Laterality: N/A;  Time-13:04, Gender-Girl Wgt-9lbs 15oz, Apgar- 9/9   . INDUCED ABORTION    . TONSILLECTOMY      Prior to Admission medications   Medication Sig Start Date End Date Taking? Authorizing Provider  brompheniramine-pseudoephedrine-DM 30-2-10 MG/5ML syrup Take 5 mLs by mouth 4 (four) times daily as needed. 10/23/17    Joni Reining, PA-C  brompheniramine-pseudoephedrine-DM 30-2-10 MG/5ML syrup Take 5 mLs by mouth 4 (four) times daily as needed. Mix with 5 mL of viscous lidocaine for swish and swallow 06/05/18   Joni Reining, PA-C  ibuprofen (ADVIL,MOTRIN) 600 MG tablet Take 1 tablet (600 mg total) by mouth every 8 (eight) hours as needed. 06/05/18   Joni Reining, PA-C  lidocaine (XYLOCAINE) 2 % solution Use as directed 5 mLs in the mouth or throat every 6 (six) hours as needed for mouth pain. Mix with Bromfed for swish and swallow 06/05/18   Joni Reining, PA-C  nystatin (MYCOSTATIN/NYSTOP) powder Apply topically 3 (three) times daily. 06/18/17   Conard Novak, MD    Allergies Patient has no known allergies.  Family History  Problem Relation Age of Onset  . Hypertension Father     Social History Social History   Tobacco Use  . Smoking status: Current Every Day Smoker    Packs/day: 0.50    Types: Cigarettes  . Smokeless tobacco: Never Used  Substance Use Topics  . Alcohol use: No  . Drug use: No    Review of Systems  Constitutional: Fever/chills and body aches.   Eyes: No visual changes. ENT: Nasal congestion and sore throat. Cardiovascular: Denies chest pain. Respiratory: Denies shortness of breath.  Nonproductive cough. Gastrointestinal: No abdominal pain.   nausea, no vomiting.  No diarrhea.  No constipation. Genitourinary: Negative for dysuria. Musculoskeletal: Negative for back pain. Skin: Negative for rash. Neurological: Positive for headaches, but denies focal weakness or  numbness. Psychiatric:  Anxiety and depression Endocrine:  Diabetes and hypertension ____________________________________________   PHYSICAL EXAM:  VITAL SIGNS: ED Triage Vitals  Enc Vitals Group     BP 06/05/18 1110 130/88     Pulse Rate 06/05/18 1110 99     Resp 06/05/18 1110 17     Temp 06/05/18 1110 98.7 F (37.1 C)     Temp Source 06/05/18 1110 Oral     SpO2 06/05/18 1110 99 %      Weight 06/05/18 1112 262 lb (118.8 kg)     Height 06/05/18 1112 5\' 4"  (1.626 m)     Head Circumference --      Peak Flow --      Pain Score 06/05/18 1111 8     Pain Loc --      Pain Edu? --      Excl. in GC? --     Constitutional: Alert and oriented. Well appearing and in no acute distress. Eyes: Conjunctivae are normal. PERRL. EOMI. Head: Atraumatic. Nose: Edematous nasal turbinates clear rhinorrhea.  Mouth/Throat: Mucous membranes are moist.  Oropharynx erythematous.  Edematous tonsils no visible exudate.  Postnasal drainage. Neck: No stridor.   Hematological/Lymphatic/Immunilogical: No cervical lymphadenopathy. Cardiovascular: Normal rate, regular rhythm. Grossly normal heart sounds.  Good peripheral circulation. Respiratory: Normal respiratory effort.  No retractions. Lungs CTAB. Gastrointestinal: Soft and nontender. No distention. No abdominal bruits. No CVA tenderness. Musculoskeletal: No lower extremity tenderness nor edema.  No joint effusions. Neurologic:  Normal speech and language. No gross focal neurologic deficits are appreciated. No gait instability. Skin:  Skin is warm, dry and intact. No rash noted. Psychiatric: Mood and affect are normal. Speech and behavior are normal.  ____________________________________________   LABS (all labs ordered are listed, but only abnormal results are displayed)  Labs Reviewed  GROUP A STREP BY PCR  INFLUENZA PANEL BY PCR (TYPE A & B)   ____________________________________________  EKG   ____________________________________________  RADIOLOGY  ED MD interpretation:    Official radiology report(s): No results found.  ____________________________________________   PROCEDURES  Procedure(s) performed (including Critical Care):  Procedures   ____________________________________________   INITIAL IMPRESSION / ASSESSMENT AND PLAN / ED COURSE  As part of my medical decision making, I reviewed the following data  within the electronic MEDICAL RECORD NUMBER     Patient presents with cough, sore throat, body aches and fever started yesterday.  Patient tested negative for influenza and strep pharyngitis.  Physical exam is consistent with viral illness.  Patient given discharge care instruction advised take medication as directed.  Patient past follow-up with PCP     ____________________________________________   FINAL CLINICAL IMPRESSION(S) / ED DIAGNOSES  Final diagnoses:  Influenza-like illness  Viral pharyngitis     ED Discharge Orders         Ordered    brompheniramine-pseudoephedrine-DM 30-2-10 MG/5ML syrup  4 times daily PRN     06/05/18 1349    lidocaine (XYLOCAINE) 2 % solution  Every 6 hours PRN     06/05/18 1349    ibuprofen (ADVIL,MOTRIN) 600 MG tablet  Every 8 hours PRN     06/05/18 1354           Note:  This document was prepared using Dragon voice recognition software and may include unintentional dictation errors.    Joni Reining, PA-C 06/05/18 1354    Jeanmarie Plant, MD 06/05/18 1535

## 2018-06-05 NOTE — ED Triage Notes (Signed)
Pt c/o cough with sore throat, bodyches and fever that started yesterday.

## 2018-06-05 NOTE — ED Notes (Signed)
Pt verbalized understanding of discharge instructions. NAD at this time. 

## 2018-08-25 ENCOUNTER — Other Ambulatory Visit: Payer: Self-pay

## 2018-08-25 ENCOUNTER — Emergency Department
Admission: EM | Admit: 2018-08-25 | Discharge: 2018-08-25 | Disposition: A | Discharge status: Home / Self Care | Payer: Self-pay | Attending: Emergency Medicine | Admitting: Emergency Medicine

## 2018-08-25 ENCOUNTER — Emergency Department: Payer: Self-pay

## 2018-08-25 DIAGNOSIS — Y929 Unspecified place or not applicable: Secondary | ICD-10-CM | POA: Insufficient documentation

## 2018-08-25 DIAGNOSIS — Y999 Unspecified external cause status: Secondary | ICD-10-CM | POA: Insufficient documentation

## 2018-08-25 DIAGNOSIS — E119 Type 2 diabetes mellitus without complications: Secondary | ICD-10-CM | POA: Insufficient documentation

## 2018-08-25 DIAGNOSIS — Y939 Activity, unspecified: Secondary | ICD-10-CM | POA: Insufficient documentation

## 2018-08-25 DIAGNOSIS — X58XXXA Exposure to other specified factors, initial encounter: Secondary | ICD-10-CM | POA: Insufficient documentation

## 2018-08-25 DIAGNOSIS — R0789 Other chest pain: Secondary | ICD-10-CM | POA: Insufficient documentation

## 2018-08-25 DIAGNOSIS — F17210 Nicotine dependence, cigarettes, uncomplicated: Secondary | ICD-10-CM | POA: Insufficient documentation

## 2018-08-25 DIAGNOSIS — S239XXA Sprain of unspecified parts of thorax, initial encounter: Secondary | ICD-10-CM | POA: Insufficient documentation

## 2018-08-25 LAB — CBC WITH DIFFERENTIAL/PLATELET
Abs Immature Granulocytes: 0.02 10*3/uL (ref 0.00–0.07)
Basophils Absolute: 0 10*3/uL (ref 0.0–0.1)
Basophils Relative: 1 %
Eosinophils Absolute: 0.3 10*3/uL (ref 0.0–0.5)
Eosinophils Relative: 5 %
HCT: 38.2 % (ref 36.0–46.0)
Hemoglobin: 11.8 g/dL — ABNORMAL LOW (ref 12.0–15.0)
Immature Granulocytes: 0 %
Lymphocytes Relative: 39 %
Lymphs Abs: 2.5 10*3/uL (ref 0.7–4.0)
MCH: 25.7 pg — ABNORMAL LOW (ref 26.0–34.0)
MCHC: 30.9 g/dL (ref 30.0–36.0)
MCV: 83 fL (ref 80.0–100.0)
Monocytes Absolute: 0.3 10*3/uL (ref 0.1–1.0)
Monocytes Relative: 5 %
Neutro Abs: 3.2 10*3/uL (ref 1.7–7.7)
Neutrophils Relative %: 50 %
Platelets: 286 10*3/uL (ref 150–400)
RBC: 4.6 MIL/uL (ref 3.87–5.11)
RDW: 14.3 % (ref 11.5–15.5)
WBC: 6.3 10*3/uL (ref 4.0–10.5)
nRBC: 0 % (ref 0.0–0.2)

## 2018-08-25 LAB — BASIC METABOLIC PANEL
Anion gap: 6 (ref 5–15)
BUN: 13 mg/dL (ref 6–20)
CO2: 23 mmol/L (ref 22–32)
Calcium: 8.5 mg/dL — ABNORMAL LOW (ref 8.9–10.3)
Chloride: 109 mmol/L (ref 98–111)
Creatinine, Ser: 0.77 mg/dL (ref 0.44–1.00)
GFR calc Af Amer: >60 mL/min (ref >60)
GFR calc non Af Amer: >60 mL/min (ref >60)
Glucose, Bld: 99 mg/dL (ref 70–99)
Potassium: 3.9 mmol/L (ref 3.5–5.1)
Sodium: 138 mmol/L (ref 135–145)

## 2018-08-25 LAB — TROPONIN I: Troponin I: <0.03 ng/mL (ref <0.03)

## 2018-08-25 MED ORDER — CYCLOBENZAPRINE HCL 5 MG PO TABS: 5.0000 mg | ORAL_TABLET | Freq: Three times a day (TID) | ORAL | 0 refills | Status: DC | PRN

## 2018-08-25 MED ORDER — KETOROLAC TROMETHAMINE 30 MG/ML IJ SOLN
30.0000 mg | Freq: Once | INTRAMUSCULAR | Status: AC
  Administered 2018-08-25: 10:00:00 30 mg via INTRAMUSCULAR
  Filled 2018-08-25: qty 1

## 2018-08-25 MED ORDER — IBUPROFEN 800 MG PO TABS: 800.0000 mg | ORAL_TABLET | Freq: Three times a day (TID) | ORAL | 0 refills | Status: DC | PRN

## 2018-08-25 MED ORDER — CYCLOBENZAPRINE HCL 10 MG PO TABS
10.0000 mg | ORAL_TABLET | Freq: Once | ORAL | Status: AC
  Administered 2018-08-25: 10:00:00 10 mg via ORAL
  Filled 2018-08-25: qty 1

## 2018-08-25 NOTE — ED Provider Notes (Addendum)
[facility] Emergency Department Provider Note ____________________________________________  Time seen: 0900  I have reviewed the triage vital signs and the nursing notes.  HISTORY  Chief Complaint  Back Pain  HPI Natasha Davidson is a 28 y.o. female presents herself to the ED via EMS from work.  Patient is a Psychologist, sport and exercise at [employer], who presents with sudden onset of mid back pain that she describes a sharp and tingling.  She describes the pain refers directly through her back to her chest.  She describes some chest tightness, and pain is increased with deep breaths and with movement.  Patient describes onset of pain when she was walking in the hallway at work, and came on suddenly without warning.  She was found to have blood pressure readings in the 140s/110s and was tachycardic between 100 to 106 bpm.  Her pain did not improve, and she was transported to the ED via EMS for further evaluation of her symptoms.  She denies any preceding nausea, vomiting, diarrhea, diaphoresis, weakness, or congestion.  She denies any ongoing cough, congestion, fever, chills, sweats patient denies any recent travel, sick contacts, or other high risk exposures.  She gives a history of gestational diabetes now with prediabetes, and pregnancy related hypertension, without any current medication for high blood pressure.  Past Medical History:  Diagnosis Date  . Anxiety   . Depression   . Diabetes mellitus without complication (HCC)   . Gestational diabetes   . Hypertension    pih  . Obesity affecting pregnancy     Patient Active Problem List   Diagnosis Date Noted  . Non-reactive NST (non-stress test) 12/02/2016  . History of chronic hypertension 11/20/2016  . Oral hypoglycemic controlled White classification A2 gestational diabetes mellitus (GDM) 11/04/2016  . BMI 40.0-44.9, adult (HCC) 05/16/2015    Past Surgical History:  Procedure Laterality Date  . CESAREAN SECTION   02/17/2011 and 06/29/2013  . CESAREAN SECTION N/A 05/16/2015   Procedure: CESAREAN SECTION;  Surgeon: Conard Novak, MD;  Location: [facility] ORS;  Service: Obstetrics;  Laterality: N/A;  . CESAREAN SECTION N/A 12/03/2016   Procedure: CESAREAN SECTION;  Surgeon: Nadara Mustard, MD;  Location: [facility] ORS;  Service: Obstetrics;  Laterality: N/A;  Time-13:04, Gender-Girl Wgt-9lbs 15oz, Apgar- 9/9   . INDUCED ABORTION    . TONSILLECTOMY      Prior to Admission medications   Medication Sig Start Date End Date Taking? Authorizing Provider  cyclobenzaprine (FLEXERIL) 5 MG tablet Take 1 tablet (5 mg total) by mouth 3 (three) times daily as needed. 08/25/18   Treysen Sudbeck, Charlesetta Ivory, PA-C  ibuprofen (ADVIL) 800 MG tablet Take 1 tablet (800 mg total) by mouth every 8 (eight) hours as needed. 08/25/18   Kemberly Taves, Charlesetta Ivory, PA-C    Allergies Patient has no known allergies.  Family History  Problem Relation Age of Onset  . Hypertension Father     Social History Social History   Tobacco Use  . Smoking status: Current Every Day Smoker    Packs/day: 0.50    Types: Cigarettes  . Smokeless tobacco: Never Used  Substance Use Topics  . Alcohol use: No  . Drug use: No    Review of Systems  Constitutional: Negative for fever. Eyes: Negative for visual changes. ENT: Negative for sore throat. Cardiovascular: Positive for chest pain. Denies peripheral edema Respiratory: Negative for shortness of breath. Gastrointestinal: Negative for abdominal pain, vomiting and diarrhea. Genitourinary: Negative for dysuria. Musculoskeletal: Positive for  back pain. Skin: Negative for rash. Neurological: Negative for headaches, focal weakness or numbness. ____________________________________________  PHYSICAL EXAM:  VITAL SIGNS: ED Triage Vitals  Enc Vitals Group     BP 08/25/18 0838 122/77     Pulse Rate 08/25/18 0838 90     Resp 08/25/18 0838 18     Temp 08/25/18 0838 99.2 F (37.3 C)     Temp  Source 08/25/18 0838 Oral     SpO2 08/25/18 0838 98 %     Weight 08/25/18 0829 262 lb (118.8 kg)     Height 08/25/18 0829  (1.626 m)     Head Circumference --      Peak Flow --      Pain Score 08/25/18 0829 10     Pain Loc --      Pain Edu? --      Excl. in GC? --     Constitutional: Alert and oriented. Well appearing and in no distress.  Patient is alert, calm, and talking in complete sentences. Head: Normocephalic and atraumatic. Eyes: Conjunctivae are normal. Normal extraocular movements Neck: Supple. Normal ROM without crepitus Cardiovascular: Normal rate, regular rhythm. Normal distal pulses. Respiratory: Normal respiratory effort. No wheezes/rales/rhonchi. Gastrointestinal: Soft and nontender. No distention. Musculoskeletal: normal spinal alignment without midline tenderness, spasm, deformity, or step-off. Nontender with normal range of motion in all extremities.  Neurologic:  Normal gait without ataxia. Normal speech and language. No gross focal neurologic deficits are appreciated. Skin:  Skin is warm, dry and intact. No rash noted. No clubbing, cyanosis, or edema. Psychiatric: Mood and affect are normal. Patient exhibits appropriate insight and judgment. ____________________________________________   LABS (pertinent positives/negatives) Labs Reviewed  BASIC METABOLIC PANEL - Abnormal; Notable for the following components:      Result Value   Calcium 8.5 (*)    All other components within normal limits  CBC WITH DIFFERENTIAL/PLATELET - Abnormal; Notable for the following components:   Hemoglobin 11.8 (*)    MCH 25.7 (*)    All other components within normal limits  TROPONIN I   ____________________________________________  EKG  NSR 77 bpm PR Interval 180 ms QRS Duration 84 ms Normal axis No STEMI ___________________________________________   RADIOLOGY  CXR  Negative ____________________________________________  PROCEDURES  Procedures Toradol 30 mg  IM Cyclobenzaprine 10 mg PO ____________________________________________  INITIAL IMPRESSION / ASSESSMENT AND PLAN / ED COURSE  Natasha Davidson was evaluated in Emergency Department on 08/25/2018 for the symptoms described in the history of present illness. She was evaluated in the context of the global COVID-19 pandemic, which necessitated consideration that the patient might be at risk for infection with the SARS-CoV-2 virus that causes COVID-19. Institutional protocols and algorithms that pertain to the evaluation of patients at risk for COVID-19 are in a state of rapid change based on information released by regulatory bodies including the CDC and federal and state organizations. These policies and algorithms were followed during the patient's care in the ED.  Differential diagnosis includes, but is not limited to, ACS, aortic dissection, pulmonary embolism, cardiac tamponade, pneumothorax, pneumonia, pericarditis, myocarditis, GI-related causes including esophagitis/gastritis, and musculoskeletal chest wall pain.    Patient with ED evaluation of reproducible midback pain with referral to the anterior chest. Her exam is reassuring and her work-up is negative. Low suspicion for cardiac etiology, PERC negative, HEART score = 1. She is discharged with instruction to take prescription ibuprofen and cyclobenzaprine for thoracic muscle strain. She should return to the ED for ongoing symptoms.  ____________________________________________  FINAL CLINICAL IMPRESSION(S) / ED DIAGNOSES  Final diagnoses:  Atypical chest pain  Thoracic back sprain, initial encounter      Lissa HoardMenshew, Karri Kallenbach V Bacon, PA-C 08/25/18 [address]    Mykai Wendorf, Charlesetta IvoryJenise V Bacon, PA-C 08/25/18 1031    Sharman CheekStafford, Phillip, MD 08/26/18 1537

## 2018-08-25 NOTE — Discharge Instructions (Signed)
Your exam, labs, EKG, and CXR are all negative and reassuring at this time. Your symptoms likely represent a musculoskeletal injury or strain. Take the prescription meds as directed. Follow-up with your provider or return as needed.

## 2018-08-25 NOTE — ED Notes (Signed)
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

## 2018-08-25 NOTE — ED Triage Notes (Signed)
Pt states she was at work and had a suddenly lower back pain that radiates up . 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

## 2018-09-28 ENCOUNTER — Telehealth: Payer: Self-pay

## 2018-09-28 DIAGNOSIS — Z20822 Contact with and (suspected) exposure to covid-19: Secondary | ICD-10-CM

## 2018-09-28 NOTE — Telephone Encounter (Signed)
Contacted by dr Posey Pronto at [facility]. Pt to be tested for COVID-19  Office [phone] Fax [phone]  Call placed to patient.  Appointment scheduled and order placed.

## 2018-09-29 ENCOUNTER — Other Ambulatory Visit: Payer: Self-pay

## 2018-09-29 DIAGNOSIS — Z20822 Contact with and (suspected) exposure to covid-19: Secondary | ICD-10-CM

## 2018-10-03 LAB — NOVEL CORONAVIRUS, NAA: SARS-CoV-2, NAA: NOT DETECTED

## 2018-11-05 ENCOUNTER — Other Ambulatory Visit: Payer: Self-pay

## 2018-11-05 DIAGNOSIS — Z20822 Contact with and (suspected) exposure to covid-19: Secondary | ICD-10-CM

## 2018-11-07 LAB — NOVEL CORONAVIRUS, NAA: SARS-CoV-2, NAA: NOT DETECTED

## 2018-11-13 ENCOUNTER — Telehealth: Payer: Self-pay | Admitting: General Practice

## 2018-11-13 NOTE — Telephone Encounter (Signed)
Negative result given to pt

## 2019-05-20 ENCOUNTER — Emergency Department: Payer: Self-pay

## 2019-05-20 ENCOUNTER — Other Ambulatory Visit: Payer: Self-pay

## 2019-05-20 ENCOUNTER — Emergency Department
Admission: EM | Admit: 2019-05-20 | Discharge: 2019-05-20 | Disposition: A | Discharge status: Home / Self Care | Payer: Self-pay | Attending: Student in an Organized Health Care Education/Training Program | Admitting: Student in an Organized Health Care Education/Training Program

## 2019-05-20 DIAGNOSIS — F17210 Nicotine dependence, cigarettes, uncomplicated: Secondary | ICD-10-CM | POA: Insufficient documentation

## 2019-05-20 DIAGNOSIS — Y929 Unspecified place or not applicable: Secondary | ICD-10-CM | POA: Insufficient documentation

## 2019-05-20 DIAGNOSIS — W010XXA Fall on same level from slipping, tripping and stumbling without subsequent striking against object, initial encounter: Secondary | ICD-10-CM | POA: Insufficient documentation

## 2019-05-20 DIAGNOSIS — Y9389 Activity, other specified: Secondary | ICD-10-CM | POA: Insufficient documentation

## 2019-05-20 DIAGNOSIS — Y999 Unspecified external cause status: Secondary | ICD-10-CM | POA: Insufficient documentation

## 2019-05-20 DIAGNOSIS — S93401A Sprain of unspecified ligament of right ankle, initial encounter: Secondary | ICD-10-CM | POA: Insufficient documentation

## 2019-05-20 MED ORDER — IBUPROFEN 600 MG PO TABS: 600.0000 mg | ORAL_TABLET | Freq: Three times a day (TID) | ORAL | 0 refills | Status: DC | PRN

## 2019-05-20 MED ORDER — IBUPROFEN 600 MG PO TABS
600.0000 mg | ORAL_TABLET | Freq: Once | ORAL | Status: AC
  Administered 2019-05-20: 600 mg via ORAL
  Filled 2019-05-20: qty 1

## 2019-05-20 MED ORDER — TRAMADOL HCL 50 MG PO TABS: 50.0000 mg | ORAL_TABLET | Freq: Four times a day (QID) | ORAL | 0 refills | Status: DC | PRN

## 2019-05-20 MED ORDER — TRAMADOL HCL 50 MG PO TABS
50.0000 mg | ORAL_TABLET | Freq: Once | ORAL | Status: AC
  Administered 2019-05-20: 50 mg via ORAL
  Filled 2019-05-20: qty 1

## 2019-05-20 NOTE — Discharge Instructions (Signed)
Follow discharge care instruction ambulate with support as directed.  Wear splint for 3 to 5 days while awake.

## 2019-05-20 NOTE — ED Triage Notes (Signed)
Pt comes via POV from home with c/o fall and right ankle pain. Pt states yesterday she slipped and fell and hurt her ankle.  Pt denies any LOC or hitting her head. Pt states she has not been able to bear weight on her ankle and it shoots pain up her leg.

## 2019-05-20 NOTE — ED Notes (Signed)
See triage note. Pt states she fell yesterday and twisted her right ankle. Minimal swelling noted.  Pt states pain increases with weight bearing and "shoots up to thigh"  Denies hitting her head with fall.  States she has tried ibuprofen for pain with minimal relief.  Pt in NAD at this time, to treatment room in wheelchair

## 2019-05-20 NOTE — ED Provider Notes (Signed)
[facility] Emergency Department Provider Note   ____________________________________________   First MD Initiated Contact with Patient 05/20/19 1220     (approximate)  I have reviewed the triage vital signs and the nursing notes.   HISTORY  Chief Complaint Ankle Pain    HPI Natasha Davidson is a 29 y.o. female patient complaining right ankle pain secondary to a fall which occurred yesterday.  Patient is able to bear weight but pain shoots up her leg.  Patient rates pain as a 10/10.  Patient described pain as "achy".  No palliative measures for complaint.         Past Medical History:  Diagnosis Date  . Anxiety   . Depression   . Diabetes mellitus without complication (HCC)   . Gestational diabetes   . Hypertension    pih  . Obesity affecting pregnancy     Patient Active Problem List   Diagnosis Date Noted  . Non-reactive NST (non-stress test) 12/02/2016  . History of chronic hypertension 11/20/2016  . Oral hypoglycemic controlled White classification A2 gestational diabetes mellitus (GDM) 11/04/2016  . BMI 40.0-44.9, adult (HCC) 05/16/2015    Past Surgical History:  Procedure Laterality Date  . CESAREAN SECTION  02/17/2011 and 06/29/2013  . CESAREAN SECTION N/A 05/16/2015   Procedure: CESAREAN SECTION;  Surgeon: Conard Novak, MD;  Location: [facility] ORS;  Service: Obstetrics;  Laterality: N/A;  . CESAREAN SECTION N/A 12/03/2016   Procedure: CESAREAN SECTION;  Surgeon: Nadara Mustard, MD;  Location: [facility] ORS;  Service: Obstetrics;  Laterality: N/A;  Time-13:04, Gender-Girl Wgt-9lbs 15oz, Apgar- 9/9   . INDUCED ABORTION    . TONSILLECTOMY      Prior to Admission medications   Medication Sig Start Date End Date Taking? Authorizing Provider  cyclobenzaprine (FLEXERIL) 5 MG tablet Take 1 tablet (5 mg total) by mouth 3 (three) times daily as needed. 08/25/18   Menshew, Charlesetta Ivory, PA-C  ibuprofen (ADVIL) 600 MG tablet Take 1 tablet (600  mg total) by mouth every 8 (eight) hours as needed. 05/20/19   Joni Reining, PA-C  ibuprofen (ADVIL) 800 MG tablet Take 1 tablet (800 mg total) by mouth every 8 (eight) hours as needed. 08/25/18   Menshew, Charlesetta Ivory, PA-C  traMADol (ULTRAM) 50 MG tablet Take 1 tablet (50 mg total) by mouth every 6 (six) hours as needed. 05/20/19 05/19/20  Joni Reining, PA-C    Allergies Patient has no known allergies.  Family History  Problem Relation Age of Onset  . Hypertension Father     Social History Social History   Tobacco Use  . Smoking status: Current Every Day Smoker    Packs/day: 0.50    Types: Cigarettes  . Smokeless tobacco: Never Used  Substance Use Topics  . Alcohol use: No  . Drug use: No    Review of Systems  Constitutional: No fever/chills Eyes: No visual changes. ENT: No sore throat. Cardiovascular: Denies chest pain. Respiratory: Denies shortness of breath. Gastrointestinal: No abdominal pain.  No nausea, no vomiting.  No diarrhea.  No constipation. Genitourinary: Negative for dysuria. Musculoskeletal: Negative for back pain. Skin: Negative for rash. Neurological: Negative for headaches, focal weakness or numbness. Psychiatric:  Anxiety depression. Endocrine:  Diabetes and hypertension ____________________________________________   PHYSICAL EXAM:  VITAL SIGNS: ED Triage Vitals  Enc Vitals Group     BP 05/20/19 1208 (!) 147/92     Pulse Rate 05/20/19 1208 (!) 104     Resp  05/20/19 1208 18     Temp 05/20/19 1208 98.3 F (36.8 C)     Temp src --      SpO2 05/20/19 1208 100 %     Weight 05/20/19 1209 235 lb (106.6 kg)     Height 05/20/19 1209 5\' 5"  (1.651 m)     Head Circumference --      Peak Flow --      Pain Score 05/20/19 1209 10     Pain Loc --      Pain Edu? --      Excl. in [place]? --    Constitutional: Alert and oriented. Well appearing and in no acute distress. Cardiovascular: Normal rate, regular rhythm. Grossly normal heart sounds.  Good  peripheral circulation. Respiratory: Normal respiratory effort.  No retractions. Lungs CTAB. Musculoskeletal: No obvious deformity to the right ankle.  Moderate edema.  Patient is moderate guarding palpation to medial malleolus .  No lower extremity tenderness nor edema.  No joint effusions. Neurologic:  Normal speech and language. No gross focal neurologic deficits are appreciated. No gait instability. Skin:  Skin is warm, dry and intact. No rash noted. Psychiatric: Mood and affect are normal. Speech and behavior are normal.  ____________________________________________   LABS (all labs ordered are listed, but only abnormal results are displayed)  Labs Reviewed - No data to display ____________________________________________  EKG   ____________________________________________  RADIOLOGY  ED MD interpretation:    Official radiology report(s): DG Ankle Complete Right  Result Date: 05/20/2019 CLINICAL DATA:  Slip and fall with right ankle pain. EXAM: RIGHT ANKLE - COMPLETE 3+ VIEW COMPARISON:  None. FINDINGS: No fracture or bone lesion. Ankle joint normally spaced and aligned.  No arthropathic changes. Soft tissues are unremarkable. IMPRESSION: Negative. Electronically Signed   By: Lajean Manes M.D.   On: 05/20/2019 12:45    ____________________________________________   PROCEDURES  Procedure(s) performed (including Critical Care):  Procedures   ____________________________________________   INITIAL IMPRESSION / ASSESSMENT AND PLAN / ED COURSE  As part of my medical decision making, I reviewed the following data within the [place]   Patient presents with right ankle pain secondary to a slip and fall yesterday.  Patient has difficulty with weightbearing secondary to pain.  Discussed x-ray findings with patient.  Physical exam consistent with ankle sprain.  Patient placed in a posterior OCL and given crutches to assist with ambulation.  Patient given  work note for 3 days.  Patient advised follow-up PCP if complaint persists.       Natasha Davidson was evaluated in Emergency Department on 05/20/2019 for the symptoms described in the history of present illness. She was evaluated in the context of the global COVID-19 pandemic, which necessitated consideration that the patient might be at risk for infection with the SARS-CoV-2 virus that causes COVID-19. Institutional protocols and algorithms that pertain to the evaluation of patients at risk for COVID-19 are in a state of rapid change based on information released by regulatory bodies including the CDC and federal and state organizations. These policies and algorithms were followed during the patient's care in the ED.       ____________________________________________   FINAL CLINICAL IMPRESSION(S) / ED DIAGNOSES  Final diagnoses:  Sprain of right ankle, unspecified ligament, initial encounter     ED Discharge Orders         Ordered    ibuprofen (ADVIL) 600 MG tablet  Every 8 hours PRN     05/20/19 1258  traMADol (ULTRAM) 50 MG tablet  Every 6 hours PRN     05/20/19 1258           Note:  This document was prepared using Dragon voice recognition software and may include unintentional dictation errors.    Joni Reining, PA-C 05/20/19 1305    Willy Eddy, MD 05/20/19 1501

## 2019-12-06 ENCOUNTER — Other Ambulatory Visit: Payer: Self-pay

## 2019-12-06 ENCOUNTER — Emergency Department
Admission: EM | Admit: 2019-12-06 | Discharge: 2019-12-06 | Disposition: A | Discharge status: Home / Self Care | Payer: Self-pay | Attending: Emergency Medicine | Admitting: Emergency Medicine

## 2019-12-06 ENCOUNTER — Emergency Department: Payer: Self-pay

## 2019-12-06 ENCOUNTER — Encounter: Payer: Self-pay | Admitting: Emergency Medicine

## 2019-12-06 DIAGNOSIS — F17210 Nicotine dependence, cigarettes, uncomplicated: Secondary | ICD-10-CM | POA: Insufficient documentation

## 2019-12-06 DIAGNOSIS — I10 Essential (primary) hypertension: Secondary | ICD-10-CM | POA: Insufficient documentation

## 2019-12-06 DIAGNOSIS — E119 Type 2 diabetes mellitus without complications: Secondary | ICD-10-CM | POA: Insufficient documentation

## 2019-12-06 DIAGNOSIS — M25561 Pain in right knee: Secondary | ICD-10-CM | POA: Insufficient documentation

## 2019-12-06 MED ORDER — MELOXICAM 15 MG PO TABS: 15.0000 mg | ORAL_TABLET | Freq: Every day | ORAL | 2 refills | Status: DC

## 2019-12-06 NOTE — ED Triage Notes (Signed)
Pt reports hurt her right knee about 5 weeks ago and continues to have pain to it. Pt reports crunches when she tried to bend it. Pt ambulatory

## 2019-12-06 NOTE — ED Notes (Signed)
See triage note  Presents s/p fall  States she fell and landed on right knee about 2 weeks ago  conts' to have pain  No swelling at present

## 2019-12-06 NOTE — ED Provider Notes (Signed)
[facility] Emergency Department Provider Note   ____________________________________________   First MD Initiated Contact with Patient 12/06/19 [phone]     (approximate)  I have reviewed the triage vital signs and the nursing notes.   HISTORY  Chief Complaint Knee Pain    HPI Natasha Davidson is a 29 y.o. female patient complaining of right anterior knee pain secondary to a fall several weeks ago.  Patient pain increases with flexion walking up steps.  Patient also states there is a "crackling" sensation when she flex her knee.  Patient is able to bear weight.  Rates pain as 8/10.  Describes pain as "achy".  No palliative measure for complaint.         Past Medical History:  Diagnosis Date  . Anxiety   . Depression   . Diabetes mellitus without complication (HCC)   . Gestational diabetes   . Hypertension    pih  . Obesity affecting pregnancy     Patient Active Problem List   Diagnosis Date Noted  . Non-reactive NST (non-stress test) 12/02/2016  . History of chronic hypertension 11/20/2016  . Oral hypoglycemic controlled White classification A2 gestational diabetes mellitus (GDM) 11/04/2016  . BMI 40.0-44.9, adult (HCC) 05/16/2015    Past Surgical History:  Procedure Laterality Date  . CESAREAN SECTION  02/17/2011 and 06/29/2013  . CESAREAN SECTION N/A 05/16/2015   Procedure: CESAREAN SECTION;  Surgeon: Conard Novak, MD;  Location: [facility] ORS;  Service: Obstetrics;  Laterality: N/A;  . CESAREAN SECTION N/A 12/03/2016   Procedure: CESAREAN SECTION;  Surgeon: Nadara Mustard, MD;  Location: [facility] ORS;  Service: Obstetrics;  Laterality: N/A;  Time-13:04, Gender-Girl Wgt-9lbs 15oz, Apgar- 9/9   . INDUCED ABORTION    . TONSILLECTOMY      Prior to Admission medications   Medication Sig Start Date End Date Taking? Authorizing Provider  meloxicam (MOBIC) 15 MG tablet Take 1 tablet (15 mg total) by mouth daily. 12/06/19 12/05/20  Joni Reining,  PA-C    Allergies Patient has no known allergies.  Family History  Problem Relation Age of Onset  . Hypertension Father     Social History Social History   Tobacco Use  . Smoking status: Current Every Day Smoker    Packs/day: 0.50    Types: Cigarettes  . Smokeless tobacco: Never Used  Substance Use Topics  . Alcohol use: No  . Drug use: No    Review of Systems  Constitutional: No fever/chills Eyes: No visual changes. ENT: No sore throat. Cardiovascular: Denies chest pain. Respiratory: Denies shortness of breath. Gastrointestinal: No abdominal pain.  No nausea, no vomiting.  No diarrhea.  No constipation. Genitourinary: Negative for dysuria. Musculoskeletal: Negative for back pain. Skin: Negative for rash. Neurological: Negative for headaches, focal weakness or numbness. Psychiatric:  Anxiety and depression. Endocrine:  Diabetes and hypertension.  ____________________________________________   PHYSICAL EXAM:  VITAL SIGNS: ED Triage Vitals  Enc Vitals Group     BP 12/06/19 0832 129/71     Pulse Rate 12/06/19 0832 92     Resp 12/06/19 0832 20     Temp 12/06/19 0832 98.4 F (36.9 C)     Temp Source 12/06/19 0832 Oral     SpO2 12/06/19 0832 96 %     Weight 12/06/19 0827 231 lb (104.8 kg)     Height 12/06/19 0827 5\' 9"  (1.753 m)     Head Circumference --      Peak Flow --  Pain Score 12/06/19 0827 8     Pain Loc --      Pain Edu? --      Excl. in [place]? --    Constitutional: Alert and oriented. Well appearing and in no acute distress.  BMI is 34.11. Cardiovascular: Normal rate, regular rhythm. Grossly normal heart sounds.  Good peripheral circulation. Respiratory: Normal respiratory effort.  No retractions. Lungs CTAB. Musculoskeletal: No obvious deformity.  Patient is moderate guarding palpation anterior patella.  Patient has full and equal range of motion. Neurologic:  Normal speech and language. No gross focal neurologic deficits are appreciated. No  gait instability. Skin:  Skin is warm, dry and intact. No rash noted.  No ecchymosis or abrasions. Psychiatric: Mood and affect are normal. Speech and behavior are normal.  ____________________________________________   LABS (all labs ordered are listed, but only abnormal results are displayed)  Labs Reviewed - No data to display ____________________________________________  EKG   ____________________________________________  RADIOLOGY  ED MD interpretation:    Official radiology report(s): DG Knee Complete 4 Views Right  Result Date: 12/06/2019 CLINICAL DATA:  Trauma 5 weeks ago with persistent pain. EXAM: RIGHT KNEE - COMPLETE 4+ VIEW COMPARISON:  None. FINDINGS: No acute fracture or dislocation. No joint effusion. 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 No acute superimposed process. Electronically Signed   By: Jeronimo Greaves M.D.   On: 12/06/2019 10:17    ____________________________________________   PROCEDURES  Procedure(s) performed (including Critical Care):  Procedures   ____________________________________________   INITIAL IMPRESSION / ASSESSMENT AND PLAN / ED COURSE  As part of my medical decision making, I reviewed the following data within the electronic MEDICAL RECORD NUMBER      Patient presents with 5 weeks of knee pain secondary to a fall.  Discussed x-ray findings with patient consistent with age events osteoarthritis with joint space narrowing.  Patient given discharge care instruction for prescription for meloxicam.  Patient advised follow-up PCP for continued care.   Natasha Davidson was evaluated in Emergency Department on 12/06/2019 for the symptoms described in the history of present illness. She was evaluated in the context of the global COVID-19 pandemic, which necessitated consideration that  the patient might be at risk for infection with the SARS-CoV-2 virus that causes COVID-19. Institutional protocols and algorithms that pertain to the evaluation of patients at risk for COVID-19 are in a state of rapid change based on information released by regulatory bodies including the CDC and federal and state organizations. These policies and algorithms were followed during the patient's care in the ED.       ____________________________________________   FINAL CLINICAL IMPRESSION(S) / ED DIAGNOSES  Final diagnoses:  Acute pain of right knee     ED Discharge Orders         Ordered    meloxicam (MOBIC) 15 MG tablet  Daily        12/06/19 1053           Note:  This document was prepared using Dragon voice recognition software and may include unintentional dictation errors.    Joni Reining, PA-C 12/06/19 1055    Delton [place], MD 12/06/19 [phone]

## 2019-12-21 ENCOUNTER — Other Ambulatory Visit: Payer: Self-pay

## 2019-12-21 ENCOUNTER — Emergency Department
Admission: EM | Admit: 2019-12-21 | Discharge: 2019-12-21 | Disposition: A | Discharge status: Home / Self Care | Payer: Self-pay | Attending: Emergency Medicine | Admitting: Emergency Medicine

## 2019-12-21 DIAGNOSIS — E119 Type 2 diabetes mellitus without complications: Secondary | ICD-10-CM | POA: Insufficient documentation

## 2019-12-21 DIAGNOSIS — F17200 Nicotine dependence, unspecified, uncomplicated: Secondary | ICD-10-CM | POA: Insufficient documentation

## 2019-12-21 DIAGNOSIS — I10 Essential (primary) hypertension: Secondary | ICD-10-CM | POA: Insufficient documentation

## 2019-12-21 DIAGNOSIS — Z113 Encounter for screening for infections with a predominantly sexual mode of transmission: Secondary | ICD-10-CM | POA: Insufficient documentation

## 2019-12-21 DIAGNOSIS — N760 Acute vaginitis: Secondary | ICD-10-CM

## 2019-12-21 DIAGNOSIS — B9689 Other specified bacterial agents as the cause of diseases classified elsewhere: Secondary | ICD-10-CM

## 2019-12-21 DIAGNOSIS — F17210 Nicotine dependence, cigarettes, uncomplicated: Secondary | ICD-10-CM | POA: Insufficient documentation

## 2019-12-21 LAB — URINALYSIS, COMPLETE (UACMP) WITH MICROSCOPIC
Bilirubin Urine: NEGATIVE
Glucose, UA: NEGATIVE mg/dL
Hgb urine dipstick: NEGATIVE
Ketones, ur: NEGATIVE mg/dL
Nitrite: NEGATIVE
Protein, ur: 30 mg/dL — AB
Specific Gravity, Urine: 1.026 (ref 1.005–1.030)
pH: 5 (ref 5.0–8.0)

## 2019-12-21 LAB — POCT PREGNANCY, URINE: Preg Test, Ur: NEGATIVE

## 2019-12-21 LAB — CHLAMYDIA/NGC RT PCR (ARMC ONLY)
Chlamydia Tr: NOT DETECTED
N gonorrhoeae: NOT DETECTED

## 2019-12-21 LAB — WET PREP, GENITAL
Sperm: NONE SEEN
Trich, Wet Prep: NONE SEEN
Yeast Wet Prep HPF POC: NONE SEEN

## 2019-12-21 MED ORDER — METRONIDAZOLE 500 MG PO TABS: 500.0000 mg | ORAL_TABLET | Freq: Two times a day (BID) | ORAL | 0 refills | Status: DC

## 2019-12-21 NOTE — Discharge Instructions (Signed)
Follow-up with your primary care provider or call the STD clinic at the [facility] department if further testing is needed.  They do this as an appointment basis and their services are free. A prescription for Flagyl was sent to your pharmacy.  This is for bacterial vaginosis.  Do not drink alcohol with this medication including alcohol found in cough medication as you can become violently sick and began vomiting with the combination of the 2 medications.

## 2019-12-21 NOTE — ED Triage Notes (Signed)
Pt here for STD testing after her partner informed her that she needed to be tested. Pt denies other symptoms.

## 2019-12-21 NOTE — ED Provider Notes (Signed)
[facility] Emergency Department Provider Note  ____________________________________________   First MD Initiated Contact with Patient 12/21/19 [phone]     (approximate)  I have reviewed the triage vital signs and the nursing notes.   HISTORY  Chief Complaint STD testing   HPI Natasha Davidson is a 29 y.o. female presents to the ED for STD testing.  Patient states she does not have any symptoms but that her partner is here to be tested and was nonspecific about any symptoms that he may be having.  Partner is also in the ED at this time to have test done.      Past Medical History:  Diagnosis Date   Anxiety    Depression    Diabetes mellitus without complication (HCC)    Gestational diabetes    Hypertension    pih   Obesity affecting pregnancy     Patient Active Problem List   Diagnosis Date Noted   Non-reactive NST (non-stress test) 12/02/2016   History of chronic hypertension 11/20/2016   Oral hypoglycemic controlled White classification A2 gestational diabetes mellitus (GDM) 11/04/2016   BMI 40.0-44.9, adult (HCC) 05/16/2015    Past Surgical History:  Procedure Laterality Date   CESAREAN SECTION  02/17/2011 and 06/29/2013   CESAREAN SECTION N/A 05/16/2015   Procedure: CESAREAN SECTION;  Surgeon: Conard Novak, MD;  Location: [facility] ORS;  Service: Obstetrics;  Laterality: N/A;   CESAREAN SECTION N/A 12/03/2016   Procedure: CESAREAN SECTION;  Surgeon: Nadara Mustard, MD;  Location: [facility] ORS;  Service: Obstetrics;  Laterality: N/A;  Time-13:04, Gender-Girl Wgt-9lbs 15oz, Apgar- 9/9    INDUCED ABORTION     TONSILLECTOMY      Prior to Admission medications   Medication Sig Start Date End Date Taking? Authorizing Provider  meloxicam (MOBIC) 15 MG tablet Take 1 tablet (15 mg total) by mouth daily. 12/06/19 12/05/20  Joni Reining, PA-C  metroNIDAZOLE (FLAGYL) 500 MG tablet Take 1 tablet (500 mg total) by mouth 2 (two) times daily.  12/21/19   Tommi Rumps, PA-C    Allergies Patient has no known allergies.  Family History  Problem Relation Age of Onset   Hypertension Father     Social History Social History   Tobacco Use   Smoking status: Current Every Day Smoker    Packs/day: 0.50    Types: Cigarettes   Smokeless tobacco: Never Used  Substance Use Topics   Alcohol use: No   Drug use: No    Review of Systems Constitutional: No fever/chills Eyes: No visual changes. Cardiovascular: Denies chest pain. Respiratory: Denies shortness of breath. Gastrointestinal: No abdominal pain.  No nausea, no vomiting.  No diarrhea.   Genitourinary: Negative for dysuria. Musculoskeletal: Negative for back pain. Skin: Negative for rash. Neurological: Negative for headaches, focal weakness or numbness. ____________________________________________   PHYSICAL EXAM:  VITAL SIGNS: ED Triage Vitals  Enc Vitals Group     BP 12/21/19 0917 (!) 145/91     Pulse Rate 12/21/19 0917 85     Resp 12/21/19 0917 18     Temp 12/21/19 0917 98.7 F (37.1 C)     Temp Source 12/21/19 0917 Oral     SpO2 12/21/19 0917 99 %     Weight 12/21/19 0916 232 lb (105.2 kg)     Height 12/21/19 0916 5\' 4"  (1.626 m)     Head Circumference --      Peak Flow --      Pain Score 12/21/19 0916  0     Pain Loc --      Pain Edu? --      Excl. in GC? --     Constitutional: Alert and oriented. Well appearing and in no acute distress.  BMI 39.8 Eyes: Conjunctivae are normal.  Head: Atraumatic. Neck: No stridor.   Cardiovascular: Normal rate, regular rhythm. Grossly normal heart sounds.  Good peripheral circulation. Respiratory: Normal respiratory effort.  No retractions. Lungs CTAB. Gastrointestinal: Soft and nontender. No distention.  Genitourinary: External genitalia is unremarkable.  Speculum exam was difficult secondary to patient's pain and discomfort.  There was white exudate noted along the vaginal walls.  Cultures for GC and  chlamydia along with wet prep was obtained. Musculoskeletal: Moves upper and lower extremities no difficulty.  Normal gait was noted. Neurologic:  Normal speech and language. No gross focal neurologic deficits are appreciated. No gait instability. Skin:  Skin is warm, dry and intact. No rash noted. Psychiatric: Mood and affect are normal. Speech and behavior are normal.  ____________________________________________   LABS (all labs ordered are listed, but only abnormal results are displayed)  Labs Reviewed  WET PREP, GENITAL - Abnormal; Notable for the following components:      Result Value   Clue Cells Wet Prep HPF POC PRESENT (*)    WBC, Wet Prep HPF POC FEW (*)    All other components within normal limits  URINALYSIS, COMPLETE (UACMP) WITH MICROSCOPIC - Abnormal; Notable for the following components:   Color, Urine YELLOW (*)    APPearance CLOUDY (*)    Protein, ur 30 (*)    Leukocytes,Ua SMALL (*)    Bacteria, UA MANY (*)    All other components within normal limits  CHLAMYDIA/NGC RT PCR ([facility] ONLY)  POC URINE PREG, ED  POCT PREGNANCY, URINE    PROCEDURES  Procedure(s) performed (including Critical Care):  Procedures   ____________________________________________   INITIAL IMPRESSION / ASSESSMENT AND PLAN / ED COURSE  As part of my medical decision making, I reviewed the following data within the electronic MEDICAL RECORD NUMBER Notes from prior ED visits and [place] Controlled Substance Database  29 year old female presents to the ED for evaluation of STDs.  Patient denies any symptoms and states that her sexual partner at this time is also here to be seen but also has no symptoms.  Physical exam shows white exudate noted along the vaginal walls.  Wet prep shows clue cells and patient was treated for bacterial vaginosis.  At the time of discharge her chlamydia and gonorrhea test were pending.  Patient is aware that she can see these results on my chart.  She was also  given information to follow-up with the [facility] department if any continued problems.  ____________________________________________   FINAL CLINICAL IMPRESSION(S) / ED DIAGNOSES  Final diagnoses:  Bacterial vaginitis     ED Discharge Orders         Ordered    metroNIDAZOLE (FLAGYL) 500 MG tablet  2 times daily        12/21/19 1139          *Please note:  Natasha Davidson was evaluated in Emergency Department on 12/21/2019 for the symptoms described in the history of present illness. She was evaluated in the context of the global COVID-19 pandemic, which necessitated consideration that the patient might be at risk for infection with the SARS-CoV-2 virus that causes COVID-19. Institutional protocols and algorithms that pertain to the evaluation of patients at risk for COVID-19 are  in a state of rapid change based on information released by regulatory bodies including the CDC and federal and state organizations. These policies and algorithms were followed during the patient's care in the ED.  Some ED evaluations and interventions may be delayed as a result of limited staffing during and the pandemic.*   Note:  This document was prepared using Dragon voice recognition software and may include unintentional dictation errors.    Tommi Rumps, PA-C 12/21/19 1224    [place], MD 12/21/19 1530

## 2019-12-21 NOTE — ED Triage Notes (Signed)
Patient states she would like to be tested for STD's. Denies having any symptoms.

## 2020-03-09 ENCOUNTER — Other Ambulatory Visit: Payer: Self-pay

## 2020-03-09 ENCOUNTER — Emergency Department
Admission: EM | Admit: 2020-03-09 | Discharge: 2020-03-09 | Disposition: A | Discharge status: Home / Self Care | Payer: Self-pay | Attending: Emergency Medicine | Admitting: Emergency Medicine

## 2020-03-09 DIAGNOSIS — E119 Type 2 diabetes mellitus without complications: Secondary | ICD-10-CM | POA: Insufficient documentation

## 2020-03-09 DIAGNOSIS — F17210 Nicotine dependence, cigarettes, uncomplicated: Secondary | ICD-10-CM | POA: Insufficient documentation

## 2020-03-09 DIAGNOSIS — M25561 Pain in right knee: Secondary | ICD-10-CM | POA: Insufficient documentation

## 2020-03-09 DIAGNOSIS — I10 Essential (primary) hypertension: Secondary | ICD-10-CM | POA: Insufficient documentation

## 2020-03-09 MED ORDER — MELOXICAM 15 MG PO TABS: 15.0000 mg | ORAL_TABLET | Freq: Every day | ORAL | 0 refills | Status: DC

## 2020-03-09 NOTE — ED Provider Notes (Signed)
[facility] Emergency Department Provider Note   ____________________________________________   First MD Initiated Contact with Patient 03/09/20 1013     (approximate)  I have reviewed the triage vital signs and the nursing notes.   HISTORY  Chief Complaint Knee Pain    HPI Natasha Davidson is a 29 y.o. female patient complain of nontraumatic right knee pain for 2 months.  Patient was seen 2 months ago and was told she has mild arthritis.  Patient the pain increases with prolonged standing while at work.  Patient rates pain as a 9/10.  Patient described pain as "achy".  No palliative measure for complaint.         Past Medical History:  Diagnosis Date   Anxiety    Depression    Diabetes mellitus without complication (HCC)    Gestational diabetes    Hypertension    pih   Obesity affecting pregnancy     Patient Active Problem List   Diagnosis Date Noted   Non-reactive NST (non-stress test) 12/02/2016   History of chronic hypertension 11/20/2016   Oral hypoglycemic controlled White classification A2 gestational diabetes mellitus (GDM) 11/04/2016   BMI 40.0-44.9, adult (HCC) 05/16/2015    Past Surgical History:  Procedure Laterality Date   CESAREAN SECTION  02/17/2011 and 06/29/2013   CESAREAN SECTION N/A 05/16/2015   Procedure: CESAREAN SECTION;  Surgeon: Conard Novak, MD;  Location: [facility] ORS;  Service: Obstetrics;  Laterality: N/A;   CESAREAN SECTION N/A 12/03/2016   Procedure: CESAREAN SECTION;  Surgeon: Nadara Mustard, MD;  Location: [facility] ORS;  Service: Obstetrics;  Laterality: N/A;  Time-13:04, Gender-Girl Wgt-9lbs 15oz, Apgar- 9/9    INDUCED ABORTION     TONSILLECTOMY      Prior to Admission medications   Medication Sig Start Date End Date Taking? Authorizing Provider  meloxicam (MOBIC) 15 MG tablet Take 1 tablet (15 mg total) by mouth daily. 12/06/19 12/05/20  Joni Reining, PA-C  meloxicam (MOBIC) 15 MG tablet  Take 1 tablet (15 mg total) by mouth daily. 03/09/20   Joni Reining, PA-C  metroNIDAZOLE (FLAGYL) 500 MG tablet Take 1 tablet (500 mg total) by mouth 2 (two) times daily. 12/21/19   Tommi Rumps, PA-C    Allergies Patient has no known allergies.  Family History  Problem Relation Age of Onset   Hypertension Father     Social History Social History   Tobacco Use   Smoking status: Current Every Day Smoker    Packs/day: 0.50    Types: Cigarettes   Smokeless tobacco: Never Used  Substance Use Topics   Alcohol use: No   Drug use: No    Review of Systems  Constitutional: No fever/chills Eyes: No visual changes. ENT: No sore throat. Cardiovascular: Denies chest pain. Respiratory: Denies shortness of breath. Gastrointestinal: No abdominal pain.  No nausea, no vomiting.  No diarrhea.  No constipation. Genitourinary: Negative for dysuria. Musculoskeletal: Right knee pain. Skin: Negative for rash. Neurological: Negative for headaches, focal weakness or numbness. Psychiatric:  Anxiety and depression. Endocrine:  Diabetes and hypertension.  ____________________________________________   PHYSICAL EXAM:  VITAL SIGNS: ED Triage Vitals [03/09/20 1013]  Enc Vitals Group     BP 136/86     Pulse Rate 79     Resp 18     Temp 98.6 F (37 C)     Temp Source Oral     SpO2 99 %     Weight 262 lb (118.8 kg)  Height 5\' 4"  (1.626 m)     Head Circumference      Peak Flow      Pain Score 9     Pain Loc      Pain Edu?      Excl. in GC?    Constitutional: Alert and oriented. Well appearing and in no acute distress.  BMI 44.97. Cardiovascular: Normal rate, regular rhythm. Grossly normal heart sounds.  Good peripheral circulation. Respiratory: Normal respiratory effort.  No retractions. Lungs CTAB. Genitourinary: Deferred Musculoskeletal: No obvious deformity to the right knee.  No edema or erythema.  Patient has moderate guarding with palpation.  Mild crepitus  appreciated with palpation. Neurologic:  Normal speech and language. No gross focal neurologic deficits are appreciated. No gait instability. Skin:  Skin is warm, dry and intact. No rash noted. Psychiatric: Mood and affect are normal. Speech and behavior are normal.  ____________________________________________   LABS (all labs ordered are listed, but only abnormal results are displayed)  Labs Reviewed - No data to display ____________________________________________  EKG   ____________________________________________  RADIOLOGY I, , personally viewed and evaluated these images (plain radiographs) as part of my medical decision making, as well as reviewing the written report by the radiologist.  ED MD interpretation:    Official radiology report(s): No results found.  ____________________________________________   PROCEDURES  Procedure(s) performed (including Critical Care):  Procedures   ____________________________________________   INITIAL IMPRESSION / ASSESSMENT AND PLAN / ED COURSE  As part of my medical decision making, I reviewed the following data within the electronic MEDICAL RECORD NUMBER         Patient presents with continued right knee pain.  Discussed previous x-ray results with patient.  Patient complaining physical exam is consistent with knee pain secondary to mild arthritis.  Patient given discharge care instruction.  Patient advised to wear elastic support while working.  Patient given prescription of OxyContin and advised to follow-up with PCP.      ____________________________________________   FINAL CLINICAL IMPRESSION(S) / ED DIAGNOSES  Final diagnoses:  Acute pain of right knee     ED Discharge Orders         Ordered    meloxicam (MOBIC) 15 MG tablet  Daily        03/09/20 1205          *Please note:  Natasha Davidson was evaluated in Emergency Department on 03/09/2020 for the symptoms described in the history of  present illness. She was evaluated in the context of the global COVID-19 pandemic, which necessitated consideration that the patient might be at risk for infection with the SARS-CoV-2 virus that causes COVID-19. Institutional protocols and algorithms that pertain to the evaluation of patients at risk for COVID-19 are in a state of rapid change based on information released by regulatory bodies including the CDC and federal and state organizations. These policies and algorithms were followed during the patient's care in the ED.  Some ED evaluations and interventions may be delayed as a result of limited staffing during and the pandemic.*   Note:  This document was prepared using Dragon voice recognition software and may include unintentional dictation errors.    14/05/2019, PA-C 03/09/20 1209    14/02/21, MD 03/09/20 [id number]

## 2020-03-09 NOTE — ED Triage Notes (Signed)
Pt to ED POV for chief complaint of pain x2 months. States she was told she had arthritis now that she has been working, pain has been radiating to thigh.  Denies injury to knee

## 2020-03-09 NOTE — Discharge Instructions (Signed)
Follow discharge care instructions and take medication as directed.  Advised elastic knee support while working.Marland Kitchen

## 2020-05-24 ENCOUNTER — Emergency Department
Admission: EM | Admit: 2020-05-24 | Discharge: 2020-05-24 | Disposition: A | Discharge status: Home / Self Care | Payer: Self-pay | Attending: Emergency Medicine | Admitting: Emergency Medicine

## 2020-05-24 ENCOUNTER — Other Ambulatory Visit: Payer: Self-pay

## 2020-05-24 ENCOUNTER — Emergency Department: Payer: Self-pay

## 2020-05-24 DIAGNOSIS — J9801 Acute bronchospasm: Secondary | ICD-10-CM | POA: Insufficient documentation

## 2020-05-24 DIAGNOSIS — I10 Essential (primary) hypertension: Secondary | ICD-10-CM | POA: Insufficient documentation

## 2020-05-24 DIAGNOSIS — J069 Acute upper respiratory infection, unspecified: Secondary | ICD-10-CM | POA: Insufficient documentation

## 2020-05-24 DIAGNOSIS — F17210 Nicotine dependence, cigarettes, uncomplicated: Secondary | ICD-10-CM | POA: Insufficient documentation

## 2020-05-24 DIAGNOSIS — E119 Type 2 diabetes mellitus without complications: Secondary | ICD-10-CM | POA: Insufficient documentation

## 2020-05-24 LAB — BASIC METABOLIC PANEL
Anion gap: 8 (ref 5–15)
BUN: 10 mg/dL (ref 6–20)
CO2: 24 mmol/L (ref 22–32)
Calcium: 8.8 mg/dL — ABNORMAL LOW (ref 8.9–10.3)
Chloride: 109 mmol/L (ref 98–111)
Creatinine, Ser: 0.82 mg/dL (ref 0.44–1.00)
GFR, Estimated: >60 mL/min (ref >60)
Glucose, Bld: 99 mg/dL (ref 70–99)
Potassium: 3.7 mmol/L (ref 3.5–5.1)
Sodium: 141 mmol/L (ref 135–145)

## 2020-05-24 LAB — TROPONIN I (HIGH SENSITIVITY): Troponin I (High Sensitivity): <2 ng/L (ref <18)

## 2020-05-24 LAB — CBC
HCT: 37.2 % (ref 36.0–46.0)
Hemoglobin: 11.2 g/dL — ABNORMAL LOW (ref 12.0–15.0)
MCH: 23.9 pg — ABNORMAL LOW (ref 26.0–34.0)
MCHC: 30.1 g/dL (ref 30.0–36.0)
MCV: 79.3 fL — ABNORMAL LOW (ref 80.0–100.0)
Platelets: 280 10*3/uL (ref 150–400)
RBC: 4.69 MIL/uL (ref 3.87–5.11)
RDW: 16.2 % — ABNORMAL HIGH (ref 11.5–15.5)
WBC: 4.9 10*3/uL (ref 4.0–10.5)
nRBC: 0 % (ref 0.0–0.2)

## 2020-05-24 MED ORDER — ALBUTEROL SULFATE HFA 108 (90 BASE) MCG/ACT IN AERS: 2.0000 | INHALATION_SPRAY | Freq: Four times a day (QID) | RESPIRATORY_TRACT | 2 refills | Status: DC | PRN

## 2020-05-24 MED ORDER — IPRATROPIUM-ALBUTEROL 0.5-2.5 (3) MG/3ML IN SOLN
3.0000 mL | Freq: Once | RESPIRATORY_TRACT | Status: AC
  Administered 2020-05-24: 3 mL via RESPIRATORY_TRACT
  Filled 2020-05-24: qty 3

## 2020-05-24 MED ORDER — PREDNISONE 50 MG PO TABS: 50.0000 mg | ORAL_TABLET | Freq: Every day | ORAL | 0 refills | Status: DC

## 2020-05-24 MED ORDER — PREDNISONE 20 MG PO TABS
60.0000 mg | ORAL_TABLET | Freq: Once | ORAL | Status: AC
  Administered 2020-05-24: 60 mg via ORAL
  Filled 2020-05-24: qty 3

## 2020-05-24 NOTE — ED Triage Notes (Signed)
Pt come with c/o CP and SOB. Pt states this started yesterday. Pt has noticeable wheezing.  Pt states mid sternal CP.

## 2020-05-24 NOTE — ED Provider Notes (Signed)
[facility] Emergency Department Provider Note   ____________________________________________    I have reviewed the triage vital signs and the nursing notes.   HISTORY  Chief Complaint Chest Pain and Shortness of Breath     HPI Natasha Davidson is a 30 y.o. female with history of diabetes, hypertension who presents with complaints of cough, wheezing and chest discomfort which started yesterday.  Patient reports she started coughing significantly yesterday.  She can hear herself wheezing.  She reports some tightness in her chest.  No diaphoresis.  No fevers or chills.  She has not been vaccinated gets COVID-19.  Has not take anything for this.  No calf pain, no leg swelling.  Occasional smoker, no history of asthma  Past Medical History:  Diagnosis Date  . Anxiety   . Depression   . Diabetes mellitus without complication (HCC)   . Gestational diabetes   . Hypertension    pih  . Obesity affecting pregnancy     Patient Active Problem List   Diagnosis Date Noted  . Non-reactive NST (non-stress test) 12/02/2016  . History of chronic hypertension 11/20/2016  . Oral hypoglycemic controlled White classification A2 gestational diabetes mellitus (GDM) 11/04/2016  . BMI 40.0-44.9, adult (HCC) 05/16/2015    Past Surgical History:  Procedure Laterality Date  . CESAREAN SECTION  02/17/2011 and 06/29/2013  . CESAREAN SECTION N/A 05/16/2015   Procedure: CESAREAN SECTION;  Surgeon: Conard Novak, MD;  Location: [facility] ORS;  Service: Obstetrics;  Laterality: N/A;  . CESAREAN SECTION N/A 12/03/2016   Procedure: CESAREAN SECTION;  Surgeon: Nadara Mustard, MD;  Location: [facility] ORS;  Service: Obstetrics;  Laterality: N/A;  Time-13:04, Gender-Girl Wgt-9lbs 15oz, Apgar- 9/9   . INDUCED ABORTION    . TONSILLECTOMY      Prior to Admission medications   Medication Sig Start Date End Date Taking? Authorizing Provider  albuterol (VENTOLIN HFA) 108 (90 Base) MCG/ACT  inhaler Inhale 2 puffs into the lungs every 6 (six) hours as needed for wheezing or shortness of breath. 05/24/20  Yes Jene Every, MD  predniSONE (DELTASONE) 50 MG tablet Take 1 tablet (50 mg total) by mouth daily with breakfast. 05/24/20  Yes Jene Every, MD  meloxicam (MOBIC) 15 MG tablet Take 1 tablet (15 mg total) by mouth daily. 12/06/19 12/05/20  Joni Reining, PA-C  meloxicam (MOBIC) 15 MG tablet Take 1 tablet (15 mg total) by mouth daily. 03/09/20   Joni Reining, PA-C  metroNIDAZOLE (FLAGYL) 500 MG tablet Take 1 tablet (500 mg total) by mouth 2 (two) times daily. 12/21/19   Tommi Rumps, PA-C     Allergies Patient has no known allergies.  Family History  Problem Relation Age of Onset  . Hypertension Father     Social History Social History   Tobacco Use  . Smoking status: Current Every Day Smoker    Packs/day: 0.50    Types: Cigarettes  . Smokeless tobacco: Never Used  Substance Use Topics  . Alcohol use: No  . Drug use: No    Review of Systems  Constitutional: No fever/chills Eyes: No visual changes.  ENT: No sore throat. Cardiovascular: Denies chest pain. Respiratory: As above Gastrointestinal: No abdominal pain.   Genitourinary: Negative for dysuria. Musculoskeletal: Negative for back pain. Skin: Negative for rash. Neurological: Negative for headaches   ____________________________________________   PHYSICAL EXAM:  VITAL SIGNS: ED Triage Vitals  Enc Vitals Group     BP 05/24/20 0901 (!) 141/86  Pulse Rate 05/24/20 0901 90     Resp 05/24/20 0901 18     Temp 05/24/20 0901 98.4 F (36.9 C)     Temp Source 05/24/20 0901 Oral     SpO2 05/24/20 0901 98 %     Weight 05/24/20 0900 105.2 kg (232 lb)     Height 05/24/20 0900 1.626 m (5\' 4" )     Head Circumference --      Peak Flow --      Pain Score 05/24/20 0904 8     Pain Loc --      Pain Edu? --      Excl. in GC? --     Constitutional: Alert and oriented.   Nose: No  congestion/rhinnorhea. Mouth/Throat: Mucous membranes are moist.    Cardiovascular: Normal rate, regular rhythm. Grossly normal heart sounds.  Good peripheral circulation. Respiratory: Normal respiratory effort.  No retractions.  Scattered wheezes Gastrointestinal: Soft and nontender. No distention.  No CVA tenderness.  Musculoskeletal: No lower extremity tenderness nor edema.  Warm and well perfused Neurologic:  Normal speech and language. No gross focal neurologic deficits are appreciated.  Skin:  Skin is warm, dry and intact. No rash noted. Psychiatric: Mood and affect are normal. Speech and behavior are normal.  ____________________________________________   LABS (all labs ordered are listed, but only abnormal results are displayed)  Labs Reviewed  BASIC METABOLIC PANEL - Abnormal; Notable for the following components:      Result Value   Calcium 8.8 (*)    All other components within normal limits  CBC - Abnormal; Notable for the following components:   Hemoglobin 11.2 (*)    MCV 79.3 (*)    MCH 23.9 (*)    RDW 16.2 (*)    All other components within normal limits  POC URINE PREG, ED  TROPONIN I (HIGH SENSITIVITY)   ____________________________________________  EKG  ED ECG REPORT I, 05/26/20, the attending physician, personally viewed and interpreted this ECG.  Date: 05/24/2020  Rhythm: normal sinus rhythm QRS Axis: normal Intervals: normal ST/T Wave abnormalities: normal Narrative Interpretation: no evidence of acute ischemia  ____________________________________________  RADIOLOGY  Chest x-ray reviewed by me, no infiltrate or effusion ____________________________________________   PROCEDURES  Procedure(s) performed: No  Procedures   Critical Care performed: No ____________________________________________   INITIAL IMPRESSION / ASSESSMENT AND PLAN / ED COURSE  Pertinent labs & imaging results that were available during my care of the  patient were reviewed by me and considered in my medical decision making (see chart for details).  Patient presents with cough, wheezing, chest tightness as above.  Most consistent with viral illness with bronchospasm possible bronchitis.  Wheezing on exam, will treat with DuoNeb's, p.o. prednisone  Lab work today is quite reassuring, normal high-sensitivity troponin, CBC BMP unremarkable  Chest x-ray clear without evidence of pneumonia, pneumothorax  Encouraged patient to get Covid vaccines as she has not been vaccinated  On reevaluation patient wheezing greatly improved, she is feeling much better. Lab work is reviewed again and is reassuring. Appropriate for discharge at this time, return precautions discussed    ____________________________________________   FINAL CLINICAL IMPRESSION(S) / ED DIAGNOSES  Final diagnoses:  Acute bronchospasm  Upper respiratory tract infection, unspecified type        Note:  This document was prepared using Dragon voice recognition software and may include unintentional dictation errors.   05/26/2020, MD 05/24/20 1100

## 2020-06-26 ENCOUNTER — Other Ambulatory Visit (HOSPITAL_COMMUNITY): Payer: Self-pay | Admitting: Internal Medicine

## 2020-06-26 ENCOUNTER — Ambulatory Visit: Payer: Self-pay | Attending: Internal Medicine

## 2020-06-26 DIAGNOSIS — Z23 Encounter for immunization: Secondary | ICD-10-CM

## 2020-06-26 NOTE — Progress Notes (Signed)
   Covid-19 Vaccination Clinic  Name:  Natasha Davidson    MRN: [id number] DOB: 15-Aug-1990  06/26/2020  Natasha Davidson was observed post Covid-19 immunization for 15 minutes without incident. She was provided with Vaccine Information Sheet and instruction to access the V-Safe system.   Natasha Davidson was instructed to call 911 with any severe reactions post vaccine: Marland Kitchen Difficulty breathing  . Swelling of face and throat  . A fast heartbeat  . A bad rash all over body  . Dizziness and weakness   Immunizations Administered    Name Date Dose VIS Date Route   PFIZER Comrnaty(Gray TOP) Covid-19 Vaccine 06/26/2020  9:33 AM 0.3 mL 03/16/2020 Intramuscular   Manufacturer: [employer], [employer]   Lot: [id number]   NDC: [phone]

## 2020-07-03 ENCOUNTER — Emergency Department
Admission: EM | Admit: 2020-07-03 | Discharge: 2020-07-03 | Disposition: A | Discharge status: Home / Self Care | Payer: Self-pay | Attending: Emergency Medicine | Admitting: Emergency Medicine

## 2020-07-03 ENCOUNTER — Other Ambulatory Visit: Payer: Self-pay

## 2020-07-03 ENCOUNTER — Emergency Department: Payer: Self-pay

## 2020-07-03 ENCOUNTER — Encounter: Payer: Self-pay | Admitting: Emergency Medicine

## 2020-07-03 DIAGNOSIS — F17210 Nicotine dependence, cigarettes, uncomplicated: Secondary | ICD-10-CM | POA: Insufficient documentation

## 2020-07-03 DIAGNOSIS — Z111 Encounter for screening for respiratory tuberculosis: Secondary | ICD-10-CM | POA: Insufficient documentation

## 2020-07-03 DIAGNOSIS — E119 Type 2 diabetes mellitus without complications: Secondary | ICD-10-CM | POA: Insufficient documentation

## 2020-07-03 DIAGNOSIS — I10 Essential (primary) hypertension: Secondary | ICD-10-CM | POA: Insufficient documentation

## 2020-07-03 NOTE — Discharge Instructions (Addendum)
Chest x-ray shows no radiological evidence of pulmonary tuberculosis.  Consider having a outpatient chest x-ray or also a blood test can be drawn by your primary care provider to test for tuberculosis.

## 2020-07-03 NOTE — ED Triage Notes (Signed)
Patient had a positive PPD.  Sent to ED by employer for follow up CXR.  Patient states she has had previous positive PPD tests, and CXR always normal.  AAOx3.  Skin warm and dry. NAD

## 2020-07-03 NOTE — ED Provider Notes (Signed)
[facility] Emergency Department Provider Note  ____________________________________________   Event Date/Time   First MD Initiated Contact with Patient 07/03/20 1323     (approximate)  I have reviewed the triage vital signs and the nursing notes.   HISTORY  Chief Complaint needs cxr   HPI Natasha Davidson is a 30 y.o. female states that she was sent by her employer to the ED for a chest x-ray.  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  She denies any symptoms.       Past Medical History:  Diagnosis Date  . Anxiety   . Depression   . Diabetes mellitus without complication (HCC)   . Gestational diabetes   . Hypertension    pih  . Obesity affecting pregnancy     Patient Active Problem List   Diagnosis Date Noted  . Non-reactive NST (non-stress test) 12/02/2016  . History of chronic hypertension 11/20/2016  . Oral hypoglycemic controlled White classification A2 gestational diabetes mellitus (GDM) 11/04/2016  . BMI 40.0-44.9, adult (HCC) 05/16/2015    Past Surgical History:  Procedure Laterality Date  . CESAREAN SECTION  02/17/2011 and 06/29/2013  . CESAREAN SECTION N/A 05/16/2015   Procedure: CESAREAN SECTION;  Surgeon: Conard Novak, MD;  Location: [facility] ORS;  Service: Obstetrics;  Laterality: N/A;  . CESAREAN SECTION N/A 12/03/2016   Procedure: CESAREAN SECTION;  Surgeon: Nadara Mustard, MD;  Location: [facility] ORS;  Service: Obstetrics;  Laterality: N/A;  Time-13:04, Gender-Girl Wgt-9lbs 15oz, Apgar- 9/9   . INDUCED ABORTION    . TONSILLECTOMY      Prior to Admission medications   Medication Sig Start Date End Date Taking? Authorizing Provider  albuterol (VENTOLIN HFA) 108 (90 Base) MCG/ACT inhaler Inhale 2 puffs into the lungs every 6 (six) hours as needed for wheezing or shortness of breath. 05/24/20   Jene Every, MD    Allergies Patient has no known allergies.  Family  History  Problem Relation Age of Onset  . Hypertension Father     Social History Social History   Tobacco Use  . Smoking status: Current Every Day Smoker    Packs/day: 0.50    Types: Cigarettes  . Smokeless tobacco: Never Used  Substance Use Topics  . Alcohol use: No  . Drug use: No    Review of Systems Constitutional: No fever/chills Eyes: No visual changes. ENT: No sore throat. Cardiovascular: Denies chest pain. Respiratory: Denies shortness of breath. Gastrointestinal: No abdominal pain.  No nausea, no vomiting.  Genitourinary: Negative for dysuria. Musculoskeletal: Negative for back pain. Skin: Negative for rash. Neurological: Negative for headaches, focal weakness or numbness. ____________________________________________   PHYSICAL EXAM:  VITAL SIGNS: ED Triage Vitals  Enc Vitals Group     BP 07/03/20 1246 123/83     Pulse Rate 07/03/20 1246 73     Resp 07/03/20 1246 16     Temp 07/03/20 1246 98.4 F (36.9 C)     Temp Source 07/03/20 1246 Oral     SpO2 07/03/20 1246 98 %     Weight 07/03/20 1245 231 lb 14.8 oz (105.2 kg)     Height 07/03/20 1245 5\' 4"  (1.626 m)     Head Circumference --      Peak Flow --      Pain Score 07/03/20 1245 0     Pain Loc --      Pain Edu? --  Excl. in GC? --     Constitutional: Alert and oriented. Well appearing and in no acute distress. Eyes: Conjunctivae are normal. PERRL. EOMI. Head: Atraumatic. Neck: No stridor.   Cardiovascular: Normal rate, regular rhythm. Grossly normal heart sounds.  Good peripheral circulation. Respiratory: Normal respiratory effort.  No retractions. Lungs CTAB. Musculoskeletal: Moves upper and lower extremities and difficulty.  Normal gait was noted. Neurologic:  Normal speech and language. No gross focal neurologic deficits are appreciated. No gait instability. Skin:  Skin is warm, dry and intact. No rash noted. Psychiatric: Mood and affect are normal. Speech and behavior are  normal.  ____________________________________________   LABS (all labs ordered are listed, but only abnormal results are displayed)  Labs Reviewed - No data to display ____________________________________________   RADIOLOGY I, Tommi Rumps, personally viewed and evaluated these images (plain radiographs) as part of my medical decision making, as well as reviewing the written report by the radiologist.   Official radiology report(s): DG Chest 2 View  Result Date: 07/03/2020 CLINICAL DATA:  Positive PPD EXAM: CHEST - 2 VIEW COMPARISON:  05/24/2020 FINDINGS: The heart size and mediastinal contours are within normal limits. Both lungs are clear. The visualized skeletal structures are unremarkable. IMPRESSION: No acute abnormality of the lungs. No radiographic evidence of pulmonary tuberculosis. Electronically Signed   By: Lauralyn Primes M.D.   On: 07/03/2020 14:03    ____________________________________________   PROCEDURES  Procedure(s) performed (including Critical Care):  Procedures   ____________________________________________   INITIAL IMPRESSION / ASSESSMENT AND PLAN / ED COURSE  As part of my medical decision making, I reviewed the following data within the electronic MEDICAL RECORD NUMBER Notes from prior ED visits and [place] Controlled Substance Database   30 year old female presents to the ED for a chest x-ray to screen for respiratory tuberculosis.  Patient states that she has had reactions to the PPD and is now sent for chest x-ray.  We discussed outpatient chest x-rays can be ordered.  We also discussed blood work to avoid having a chest x-ray.  A copy of her chest x-ray was printed so that she can take this to work to prove that she did have a chest x-ray and it is negative for any evidence of respiratory tuberculosis per radiologist.  ____________________________________________   FINAL CLINICAL IMPRESSION(S) / ED DIAGNOSES  Final diagnoses:  Encounter for  screening for respiratory tuberculosis     ED Discharge Orders    None      *Please note:  Natasha Davidson was evaluated in Emergency Department on 07/03/2020 for the symptoms described in the history of present illness. She was evaluated in the context of the global COVID-19 pandemic, which necessitated consideration that the patient might be at risk for infection with the SARS-CoV-2 virus that causes COVID-19. Institutional protocols and algorithms that pertain to the evaluation of patients at risk for COVID-19 are in a state of rapid change based on information released by regulatory bodies including the CDC and federal and state organizations. These policies and algorithms were followed during the patient's care in the ED.  Some ED evaluations and interventions may be delayed as a result of limited staffing during and the pandemic.*   Note:  This document was prepared using Dragon voice recognition software and may include unintentional dictation errors.    Tommi Rumps, PA-C 07/03/20 1420    Gilles Chiquito, MD 07/03/20 1556

## 2020-07-03 NOTE — ED Notes (Signed)
See triage note. Pt states never been diagnosed with TB or been symptomatic. Denies symptoms today but reports had positive skin reaction last week and again upon repeat test this week so work wanted her to get a CXR. Pt in NAD.

## 2020-07-17 ENCOUNTER — Ambulatory Visit: Payer: Self-pay

## 2020-09-08 ENCOUNTER — Ambulatory Visit: Payer: Self-pay

## 2020-10-17 ENCOUNTER — Other Ambulatory Visit: Payer: Self-pay

## 2020-11-16 ENCOUNTER — Emergency Department
Admission: EM | Admit: 2020-11-16 | Discharge: 2020-11-16 | Disposition: A | Discharge status: Home / Self Care | Payer: Self-pay | Attending: Student in an Organized Health Care Education/Training Program | Admitting: Student in an Organized Health Care Education/Training Program

## 2020-11-16 ENCOUNTER — Other Ambulatory Visit: Payer: Self-pay

## 2020-11-16 ENCOUNTER — Emergency Department: Payer: Self-pay

## 2020-11-16 DIAGNOSIS — E119 Type 2 diabetes mellitus without complications: Secondary | ICD-10-CM | POA: Insufficient documentation

## 2020-11-16 DIAGNOSIS — I10 Essential (primary) hypertension: Secondary | ICD-10-CM | POA: Insufficient documentation

## 2020-11-16 DIAGNOSIS — F17210 Nicotine dependence, cigarettes, uncomplicated: Secondary | ICD-10-CM | POA: Insufficient documentation

## 2020-11-16 DIAGNOSIS — R102 Pelvic and perineal pain: Secondary | ICD-10-CM | POA: Insufficient documentation

## 2020-11-16 DIAGNOSIS — N939 Abnormal uterine and vaginal bleeding, unspecified: Secondary | ICD-10-CM | POA: Insufficient documentation

## 2020-11-16 LAB — HCG, QUANTITATIVE, PREGNANCY: hCG, Beta Chain, Quant, S: <1 m[IU]/mL (ref <5)

## 2020-11-16 LAB — POC URINE PREG, ED: Preg Test, Ur: NEGATIVE

## 2020-11-16 NOTE — ED Triage Notes (Addendum)
Pt come with c/o abdominal pain and cramping. Pt states +preg test and has had tubes tied.  Pt states her period was 4 days late. Pt states bleeding that has been more like spotting for about 2 weeks. Pt states right sided and back cramping and pain.

## 2020-11-16 NOTE — Discharge Instructions (Addendum)
Follow up with primary care or gynecology

## 2020-11-16 NOTE — ED Provider Notes (Signed)
[facility] Emergency Department Provider Note ____________________________________________   Event Date/Time   First MD Initiated Contact with Patient 11/16/20 1105     (approximate)  I have reviewed the triage vital signs and the nursing notes.   HISTORY  Chief Complaint Abdominal Pain  HPI BERNADETTE Davidson is a 30 y.o. female with history of DM, hypertension, and bilateral tubal ligation presents to the emergency department for treatment and evaluation of pelvic pain and vaginal bleeding intermittently x 2 weeks. No active bleeding today, just dark red/brown on tissue after urinating. No urinary symptoms. No fever, no nausea or vomiting.          Past Medical History:  Diagnosis Date   Anxiety    Depression    Diabetes mellitus without complication (HCC)    Gestational diabetes    Hypertension    pih   Obesity affecting pregnancy     Patient Active Problem List   Diagnosis Date Noted   Non-reactive NST (non-stress test) 12/02/2016   History of chronic hypertension 11/20/2016   Oral hypoglycemic controlled White classification A2 gestational diabetes mellitus (GDM) 11/04/2016   BMI 40.0-44.9, adult (HCC) 05/16/2015    Past Surgical History:  Procedure Laterality Date   CESAREAN SECTION  02/17/2011 and 06/29/2013   CESAREAN SECTION N/A 05/16/2015   Procedure: CESAREAN SECTION;  Surgeon: Conard Novak, MD;  Location: [facility] ORS;  Service: Obstetrics;  Laterality: N/A;   CESAREAN SECTION N/A 12/03/2016   Procedure: CESAREAN SECTION;  Surgeon: Nadara Mustard, MD;  Location: [facility] ORS;  Service: Obstetrics;  Laterality: N/A;  Time-13:04, Gender-Girl Wgt-9lbs 15oz, Apgar- 9/9    INDUCED ABORTION     TONSILLECTOMY      Prior to Admission medications   Medication Sig Start Date End Date Taking? Authorizing Provider  albuterol (VENTOLIN HFA) 108 (90 Base) MCG/ACT inhaler Inhale 2 puffs into the lungs every 6 (six) hours as needed for wheezing or  shortness of breath. 05/24/20   Jene Every, MD  COVID-19 mRNA Vac-TriS, Pfizer, SUSP injection USE AS DIRECTED 06/26/20 06/26/21  Judyann Munson, MD    Allergies Patient has no known allergies.  Family History  Problem Relation Age of Onset   Hypertension Father     Social History Social History   Tobacco Use   Smoking status: Every Day    Packs/day: 0.50    Types: Cigarettes   Smokeless tobacco: Never  Substance Use Topics   Alcohol use: No   Drug use: No    Review of Systems  Constitutional: No fever/chills Eyes: No visual changes. ENT: No sore throat. Cardiovascular: Denies chest pain. Respiratory: Denies shortness of breath. Gastrointestinal: No abdominal pain.  No nausea, no vomiting.  No diarrhea.  No constipation. Genitourinary: Negative for dysuria. Musculoskeletal: Negative for back pain. Skin: Negative for rash. Neurological: Negative for headaches, focal weakness or numbness. ____________________________________________   PHYSICAL EXAM:  VITAL SIGNS: ED Triage Vitals  Enc Vitals Group     BP 11/16/20 0824 136/80     Pulse Rate 11/16/20 0824 72     Resp 11/16/20 0824 18     Temp 11/16/20 0824 98.2 F (36.8 C)     Temp src --      SpO2 11/16/20 0824 100 %     Weight --      Height --      Head Circumference --      Peak Flow --      Pain Score 11/16/20 0819 4  Pain Loc --      Pain Edu? --      Excl. in GC? --     Constitutional: Alert and oriented. Well appearing and in no acute distress. Eyes: Conjunctivae are normal. PERRL. EOMI. Head: Atraumatic. Nose: No congestion/rhinnorhea. Mouth/Throat: Mucous membranes are moist.  Oropharynx non-erythematous. Neck: No stridor.   Hematological/Lymphatic/Immunilogical: No cervical lymphadenopathy. Cardiovascular: Normal rate, regular rhythm. Grossly normal heart sounds.  Good peripheral circulation. Respiratory: Normal respiratory effort.  No retractions. Lungs CTAB. Gastrointestinal: Soft  and nontender. No distention. No abdominal bruits. No CVA tenderness. Genitourinary:  Musculoskeletal: No lower extremity tenderness nor edema.  No joint effusions. Neurologic:  Normal speech and language. No gross focal neurologic deficits are appreciated. No gait instability. Skin:  Skin is warm, dry and intact. No rash noted. Psychiatric: Mood and affect are normal. Speech and behavior are normal.  ____________________________________________   LABS (all labs ordered are listed, but only abnormal results are displayed)  Labs Reviewed  HCG, QUANTITATIVE, PREGNANCY  POC URINE PREG, ED   ____________________________________________  EKG  Not indicated. ____________________________________________  RADIOLOGY  ED MD interpretation:    Normal pelvic US. I, Kem Boroughs, personally viewed and evaluated these images (plain radiographs) as part of my medical decision making, as well as reviewing the written report by the radiologist.  Official radiology report(s): US PELVIC COMPLETE WITH TRANSVAGINAL  Result Date: 11/16/2020 CLINICAL DATA:  Pelvic pain and bleeding for 1 week EXAM: TRANSABDOMINAL AND TRANSVAGINAL ULTRASOUND OF PELVIS TECHNIQUE: Both transabdominal and transvaginal ultrasound examinations of the pelvis were performed. Transabdominal technique was performed for global imaging of the pelvis including uterus, ovaries, adnexal regions, and pelvic cul-de-sac. It was necessary to proceed with endovaginal exam following the transabdominal exam to visualize the uterus, endometrium and ovaries. COMPARISON:  None FINDINGS: Uterus Measurements: 9.4 x 4.2 x 5.8 cm = volume: 120 mL. No fibroids or other mass visualized. Endometrium Thickness: 8.2 mm.  No focal abnormality visualized. Right ovary Measurements: 3.6 x 2.4 x 2.9 cm = volume: 13.0 mL. Normal appearance/no adnexal mass. Left ovary Measurements: 3.2 x 1.8 x 1.9 cm = volume: 6.0 mL. Normal appearance/no adnexal mass. Other  findings No abnormal free fluid. IMPRESSION: 1. Normal pelvic sonogram. Electronically Signed   By: Signa Kell M.D.   On: 11/16/2020 12:40    ____________________________________________   PROCEDURES  Procedure(s) performed (including Critical Care):  Procedures  ____________________________________________   INITIAL IMPRESSION / ASSESSMENT AND PLAN     30 year old female presenting to the emergency department for treatment and evaluation of pelvic pain and vaginal bleeding that has lasted longer than her normal menstrual cycle.  She did have a positive pregnancy test at home.  See HPI for further details.  DIFFERENTIAL DIAGNOSIS  IUP, spontaneous abortion, ovarian torsion, ovarian cyst, dysfunctional bleeding.  ED COURSE  Beta-hCG and urine pregnancy test are both negative.  Pelvic ultrasound is without acute findings.  Results were discussed with the patient.  She feels reassured and will follow up with her primary care or gynecologist.    ___________________________________________   FINAL CLINICAL IMPRESSION(S) / ED DIAGNOSES  Final diagnoses:  Pelvic pain  Abnormal vaginal bleeding     ED Discharge Orders     None        Natasha Davidson was evaluated in Emergency Department on 11/16/2020 for the symptoms described in the history of present illness. She was evaluated in the context of the global COVID-19 pandemic, which necessitated consideration that the patient might be  at risk for infection with the SARS-CoV-2 virus that causes COVID-19. Institutional protocols and algorithms that pertain to the evaluation of patients at risk for COVID-19 are in a state of rapid change based on information released by regulatory bodies including the CDC and federal and state organizations. These policies and algorithms were followed during the patient's care in the ED.   Note:  This document was prepared using Dragon voice recognition software and may include unintentional  dictation errors.    Chinita Pester, FNP 11/16/20 1410    Willy Eddy, MD 11/16/20 1501

## 2020-11-16 NOTE — ED Notes (Signed)
Pt to US at this time.

## 2021-02-21 ENCOUNTER — Encounter (HOSPITAL_COMMUNITY): Payer: Self-pay

## 2021-02-21 ENCOUNTER — Emergency Department (HOSPITAL_COMMUNITY)
Admission: EM | Admit: 2021-02-21 | Discharge: 2021-02-21 | Disposition: A | Discharge status: Home / Self Care | Payer: Self-pay | Attending: Emergency Medicine | Admitting: Emergency Medicine

## 2021-02-21 DIAGNOSIS — J101 Influenza due to other identified influenza virus with other respiratory manifestations: Secondary | ICD-10-CM | POA: Insufficient documentation

## 2021-02-21 DIAGNOSIS — I10 Essential (primary) hypertension: Secondary | ICD-10-CM | POA: Insufficient documentation

## 2021-02-21 DIAGNOSIS — E119 Type 2 diabetes mellitus without complications: Secondary | ICD-10-CM | POA: Insufficient documentation

## 2021-02-21 DIAGNOSIS — F17210 Nicotine dependence, cigarettes, uncomplicated: Secondary | ICD-10-CM | POA: Insufficient documentation

## 2021-02-21 DIAGNOSIS — J111 Influenza due to unidentified influenza virus with other respiratory manifestations: Secondary | ICD-10-CM

## 2021-02-21 DIAGNOSIS — Z20822 Contact with and (suspected) exposure to covid-19: Secondary | ICD-10-CM | POA: Insufficient documentation

## 2021-02-21 LAB — RESP PANEL BY RT-PCR (FLU A&B, COVID) ARPGX2
Influenza A by PCR: POSITIVE — AB
Influenza B by PCR: NEGATIVE
SARS Coronavirus 2 by RT PCR: NEGATIVE

## 2021-02-21 MED ORDER — BENZONATATE 100 MG PO CAPS: 100.0000 mg | ORAL_CAPSULE | Freq: Three times a day (TID) | ORAL | 0 refills | Status: DC

## 2021-02-21 NOTE — Discharge Instructions (Signed)
Please read and follow all provided instructions.  Your diagnoses today include:  1. Influenza-like illness     Tests performed today include: COVID/flu testing: Pending, results will be available in a few hours on MyChart Vital signs. See below for your results today.   Medications prescribed:  Tessalon Perles - cough suppressant medication  Take any prescribed medications only as directed.  Home care instructions:  Follow any educational materials contained in this packet. Please continue drinking plenty of fluids. Use over-the-counter cold and flu medications as needed as directed on packaging for symptom relief. You may also use ibuprofen or tylenol as directed on packaging for pain or fever.   BE VERY CAREFUL not to take multiple medicines containing Tylenol (also called acetaminophen). Doing so can lead to an overdose which can damage your liver and cause liver failure and possibly death.   Follow-up instructions: Please follow-up with your primary care provider in the next 3 days for further evaluation of your symptoms.   Return instructions:  Please return to the Emergency Department if you experience worsening symptoms. Please return if you have a high fever greater than 101 degrees not controlled with over-the-counter medications, persistent vomiting and cannot keep down fluids, or worsening trouble breathing. Please return if you have any other emergent concerns.  Additional Information:  Your vital signs today were: BP (!) 146/92 (BP Location: Left Arm)   Pulse 98   Temp 98.7 F (37.1 C) (Oral)   Resp 16   Ht 5\' 4"  (1.626 m)   Wt 118.8 kg   LMP 02/07/2021   SpO2 99%   BMI 44.97 kg/m  If your blood pressure (BP) was elevated above 135/85 this visit, please have this repeated by your doctor within one month.

## 2021-02-21 NOTE — ED Provider Notes (Signed)
[facility]-EMERGENCY DEPT Provider Note   CSN: [id number] Arrival date & time: 02/21/21  3536     History Chief Complaint  Patient presents with   Cough   Chills    Natasha Davidson is a 30 y.o. female.  Patient with no past pulmonary history presents the emergency department for flulike symptoms.  Patient stated that she started with a dry cough about 7 days ago.  Over the next couple of days this worsened and she developed fevers, body aches, sore throat, headache.  She has chest pain with coughing.  This has persisted over the weekend and over the past couple of days.  She has been taking over-the-counter medications without improvement.  Patient denies risk factors for pulmonary embolism including: unilateral leg swelling, history of DVT/PE/other blood clots, use of exogenous hormones, recent immobilizations, recent surgery, recent travel (>4hr segment), malignancy, hemoptysis.         Past Medical History:  Diagnosis Date   Anxiety    Depression    Diabetes mellitus without complication (HCC)    Gestational diabetes    Hypertension    pih   Obesity affecting pregnancy     Patient Active Problem List   Diagnosis Date Noted   Non-reactive NST (non-stress test) 12/02/2016   History of chronic hypertension 11/20/2016   Oral hypoglycemic controlled White classification A2 gestational diabetes mellitus (GDM) 11/04/2016   BMI 40.0-44.9, adult (HCC) 05/16/2015    Past Surgical History:  Procedure Laterality Date   CESAREAN SECTION  02/17/2011 and 06/29/2013   CESAREAN SECTION N/A 05/16/2015   Procedure: CESAREAN SECTION;  Surgeon: Conard Novak, MD;  Location: [facility] ORS;  Service: Obstetrics;  Laterality: N/A;   CESAREAN SECTION N/A 12/03/2016   Procedure: CESAREAN SECTION;  Surgeon: Nadara Mustard, MD;  Location: [facility] ORS;  Service: Obstetrics;  Laterality: N/A;  Time-13:04, Gender-Girl Wgt-9lbs 15oz, Apgar- 9/9    INDUCED ABORTION      TONSILLECTOMY       OB History     Gravida  5   Para  4   Term  4   Preterm  0   AB  1   Living  3      SAB      IAB      Ectopic      Multiple  0   Live Births  3           Family History  Problem Relation Age of Onset   Hypertension Father     Social History   Tobacco Use   Smoking status: Every Day    Packs/day: 0.50    Types: Cigarettes   Smokeless tobacco: Never  Substance Use Topics   Alcohol use: No   Drug use: No    Home Medications Prior to Admission medications   Medication Sig Start Date End Date Taking? Authorizing Provider  albuterol (VENTOLIN HFA) 108 (90 Base) MCG/ACT inhaler Inhale 2 puffs into the lungs every 6 (six) hours as needed for wheezing or shortness of breath. 05/24/20   Jene Every, MD  COVID-19 mRNA Vac-TriS, Pfizer, SUSP injection USE AS DIRECTED 06/26/20 06/26/21  Judyann Munson, MD    Allergies    Patient has no known allergies.  Review of Systems   Review of Systems  Constitutional:  Positive for chills, fatigue and fever.  HENT:  Negative for congestion, ear pain, rhinorrhea, sinus pressure and sore throat.   Eyes:  Negative for redness.  Respiratory:  Positive  for cough. Negative for shortness of breath and wheezing.   Gastrointestinal:  Negative for abdominal pain, diarrhea, nausea and vomiting.  Genitourinary:  Negative for dysuria.  Musculoskeletal:  Positive for myalgias. Negative for neck stiffness.  Skin:  Negative for rash.  Neurological:  Positive for headaches.  Hematological:  Negative for adenopathy.   Physical Exam Updated Vital Signs BP (!) 146/92 (BP Location: Left Arm)   Pulse 98   Temp 98.7 F (37.1 C) (Oral)   Resp 16   Ht 5\' 4"  (1.626 m)   Wt 118.8 kg   LMP 02/07/2021   SpO2 99%   BMI 44.97 kg/m   Physical Exam Vitals and nursing note reviewed.  Constitutional:      Appearance: She is well-developed.  HENT:     Head: Normocephalic and atraumatic.     Jaw: No trismus.      Right Ear: Tympanic membrane, ear canal and external ear normal.     Left Ear: Tympanic membrane, ear canal and external ear normal.     Nose: Nose normal. No mucosal edema or rhinorrhea.     Mouth/Throat:     Mouth: Mucous membranes are moist. Mucous membranes are not dry. No oral lesions.     Pharynx: Uvula midline. No oropharyngeal exudate, posterior oropharyngeal erythema or uvula swelling.     Tonsils: No tonsillar abscesses.  Eyes:     General:        Right eye: No discharge.        Left eye: No discharge.     Conjunctiva/sclera: Conjunctivae normal.  Cardiovascular:     Rate and Rhythm: Normal rate and regular rhythm.     Heart sounds: Normal heart sounds.  Pulmonary:     Effort: Pulmonary effort is normal. No respiratory distress.     Breath sounds: Normal breath sounds. No wheezing, rhonchi or rales.  Abdominal:     Palpations: Abdomen is soft.     Tenderness: There is no abdominal tenderness.  Musculoskeletal:     Cervical back: Normal range of motion and neck supple.  Lymphadenopathy:     Cervical: No cervical adenopathy.  Skin:    General: Skin is warm and dry.  Neurological:     Mental Status: She is alert.  Psychiatric:        Mood and Affect: Mood normal.    ED Results / Procedures / Treatments   Labs (all labs ordered are listed, but only abnormal results are displayed) Labs Reviewed  RESP PANEL BY RT-PCR (FLU A&B, COVID) [id number]    EKG None  Radiology No results found.  Procedures Procedures   Medications Ordered in ED Medications - No data to display  ED Course  I have reviewed the triage vital signs and the nursing notes.  Pertinent labs & imaging results that were available during my care of the patient were reviewed by me and considered in my medical decision making (see chart for details).  Patient seen and examined.   Vital signs reviewed and are as follows: BP (!) 146/92 (BP Location: Left Arm)   Pulse 98   Temp 98.7 F (37.1 C)  (Oral)   Resp 16   Ht 5\' 4"  (1.626 m)   Wt 118.8 kg   LMP 02/07/2021   SpO2 99%   BMI 44.97 kg/m   8:31 AM well-appearing patient with flulike illness.  We will send testing to rule out COVID.  Patient is not hypoxic.  No respiratory distress.  Low concern for  pneumonia given normal lung sounds.  Will give prescription for Tessalon for cough.  Patient discharged to home. Encouraged to rest and drink plenty of fluids.  Patient told to return to ED or see their primary doctor if their symptoms worsen, high fever not controlled with tylenol, persistent vomiting, they feel they are dehydrated, or if they have any other concerns.  Patient verbalized understanding and agreed with plan.      MDM Rules/Calculators/A&P                           Patient with symptoms consistent with influenza. Testing pending.  Vitals are stable, low-grade fever. No signs of dehydration, tolerating PO's. Lungs are clear. Supportive therapy indicated with return if symptoms worsen. Patient counseled.  Final Clinical Impression(s) / ED Diagnoses Final diagnoses:  Influenza-like illness    Rx / DC Orders ED Discharge Orders          Ordered    benzonatate (TESSALON) 100 MG capsule  Every 8 hours        02/21/21 0828             Renne Crigler, PA-C 02/21/21 [id number]    Mancel Bale, MD 02/21/21 1416

## 2021-02-21 NOTE — ED Triage Notes (Signed)
Pt presents with c/o cough, chills, and headache for one week.

## 2021-06-09 ENCOUNTER — Encounter (HOSPITAL_COMMUNITY): Payer: Self-pay

## 2021-06-09 ENCOUNTER — Emergency Department (HOSPITAL_COMMUNITY)
Admission: EM | Admit: 2021-06-09 | Discharge: 2021-06-09 | Disposition: A | Discharge status: Home / Self Care | Payer: Self-pay | Attending: Emergency Medicine | Admitting: Emergency Medicine

## 2021-06-09 ENCOUNTER — Other Ambulatory Visit: Payer: Self-pay

## 2021-06-09 DIAGNOSIS — K0889 Other specified disorders of teeth and supporting structures: Secondary | ICD-10-CM | POA: Insufficient documentation

## 2021-06-09 MED ORDER — OXYCODONE-ACETAMINOPHEN 5-325 MG PO TABS: 1.0000 | ORAL_TABLET | Freq: Four times a day (QID) | ORAL | 0 refills | Status: DC | PRN

## 2021-06-09 MED ORDER — AMOXICILLIN 500 MG PO CAPS: 500.0000 mg | ORAL_CAPSULE | Freq: Three times a day (TID) | ORAL | 0 refills | Status: DC

## 2021-06-09 NOTE — ED Provider Notes (Signed)
?[facility]-EMERGENCY DEPT ?Provider Note ? ? ?CSN: [id number] ?Arrival date & time: 06/09/21  0641 ? ?  ? ?History ? ?Chief Complaint  ?Patient presents with  ? Dental Pain  ? ? ?Natasha Davidson is a 31 y.o. female. ? ?The history is provided by the patient.  ?Dental Pain ?Associated symptoms: no facial swelling   ?Patient presents dental pain.  Has had for last couple days.  Has had a chipped tooth there but now it started to hurt.  Worse with eating.  No fevers.  No difficulty swallowing.  Is on the upper left jaw.  Patient does not have a dentist. ?  ? ?Home Medications ?Prior to Admission medications   ?Medication Sig Start Date End Date Taking? Authorizing Provider  ?albuterol (VENTOLIN HFA) 108 (90 Base) MCG/ACT inhaler Inhale 2 puffs into the lungs every 6 (six) hours as needed for wheezing or shortness of breath. 05/24/20   Jene Every, MD  ?amoxicillin (AMOXIL) 500 MG capsule Take 1 capsule (500 mg total) by mouth 3 (three) times daily. 06/09/21   Benjiman Core, MD  ?benzonatate (TESSALON) 100 MG capsule Take 1 capsule (100 mg total) by mouth every 8 (eight) hours. 02/21/21   Renne Crigler, PA-C  ?COVID-19 mRNA Vac-TriS, Pfizer, SUSP injection USE AS DIRECTED 06/26/20 06/26/21  Judyann Munson, MD  ?oxyCODONE-acetaminophen (PERCOCET/ROXICET) 5-325 MG tablet Take 1 tablet by mouth every 6 (six) hours as needed for severe pain. 06/09/21   Benjiman Core, MD  ?   ? ?Allergies    ?Patient has no known allergies.   ? ?Review of Systems   ?Review of Systems  ?Constitutional:  Negative for appetite change.  ?HENT:  Positive for dental problem. Negative for facial swelling.   ?Respiratory:  Negative for shortness of breath.   ? ?Physical Exam ?Updated Vital Signs ?BP (!) 143/97 (BP Location: Right Arm)   Pulse 77   Temp 98.6 ?F (37 ?C) (Oral)   Resp 16   Ht 5\' 4"  (1.626 m)   Wt 117.9 kg   SpO2 98%   BMI 44.63 kg/m?  ?Physical Exam ?Vitals reviewed.  ?HENT:  ?   Mouth/Throat:  ?    Comments: Tenderness to left maxillary teeth on the mid part of her jaw.  No swelling of the gum. ?Neurological:  ?   General: No focal deficit present.  ?   Mental Status: She is alert.  ? ? ?ED Results / Procedures / Treatments   ?Labs ?(all labs ordered are listed, but only abnormal results are displayed) ?Labs Reviewed - No data to display ? ?EKG ?None ? ?Radiology ?No results found. ? ?Procedures ?Procedures  ? ? ?Medications Ordered in ED ?Medications - No data to display ? ?ED Course/ Medical Decision Making/ A&P ?  ?                        ?Medical Decision Making ?Risk ?Prescription drug management. ? ? ?Patient dental pain.  Tenderness to the teeth.  Overall rather good dentition.  Patient states tooth is broken off but only small defects.  No swelling of the mouth.  Doubt severe abscess.  Will treat with pain medicines and antibiotics.  Dental follow-up as needed ? ? ? ? ? ? ? ?Final Clinical Impression(s) / ED Diagnoses ?Final diagnoses:  ?Pain, dental  ? ? ?Rx / DC Orders ?ED Discharge Orders   ? ?      Ordered  ?  amoxicillin (AMOXIL)  500 MG capsule  3 times daily,   Status:  Discontinued       ? 06/09/21 0708  ?  oxyCODONE-acetaminophen (PERCOCET/ROXICET) 5-325 MG tablet  Every 6 hours PRN,   Status:  Discontinued       ? 06/09/21 0708  ?  amoxicillin (AMOXIL) 500 MG capsule  3 times daily       ? 06/09/21 0718  ?  oxyCODONE-acetaminophen (PERCOCET/ROXICET) 5-325 MG tablet  Every 6 hours PRN       ? 06/09/21 0718  ? ?  ?  ? ?  ? ? ?  ?Benjiman Core, MD ?06/09/21 0732 ? ?

## 2021-06-09 NOTE — ED Triage Notes (Signed)
Patient is having dental pain back in her molar on the right upper side. She said she has a chipped tooth. The pain started 2 days ago. Hurts to eat and drink right now. ?

## 2021-07-02 LAB — FETAL NONSTRESS TEST

## 2021-08-09 ENCOUNTER — Encounter (HOSPITAL_COMMUNITY): Payer: Self-pay | Admitting: Emergency Medicine

## 2021-08-09 ENCOUNTER — Emergency Department (HOSPITAL_BASED_OUTPATIENT_CLINIC_OR_DEPARTMENT_OTHER): Payer: Self-pay

## 2021-08-09 ENCOUNTER — Emergency Department (HOSPITAL_COMMUNITY)
Admission: EM | Admit: 2021-08-09 | Discharge: 2021-08-09 | Disposition: A | Discharge status: Home / Self Care | Payer: Self-pay | Attending: Emergency Medicine | Admitting: Emergency Medicine

## 2021-08-09 ENCOUNTER — Other Ambulatory Visit: Payer: Self-pay

## 2021-08-09 DIAGNOSIS — M79609 Pain in unspecified limb: Secondary | ICD-10-CM

## 2021-08-09 DIAGNOSIS — M79605 Pain in left leg: Secondary | ICD-10-CM | POA: Insufficient documentation

## 2021-08-09 MED ORDER — IBUPROFEN 600 MG PO TABS: 600.0000 mg | ORAL_TABLET | Freq: Four times a day (QID) | ORAL | 0 refills | Status: DC | PRN

## 2021-08-09 MED ORDER — IBUPROFEN 800 MG PO TABS
800.0000 mg | ORAL_TABLET | Freq: Once | ORAL | Status: AC
  Administered 2021-08-09: 800 mg via ORAL
  Filled 2021-08-09: qty 1

## 2021-08-09 MED ORDER — ACETAMINOPHEN 500 MG PO TABS
1000.0000 mg | ORAL_TABLET | Freq: Once | ORAL | Status: AC
  Administered 2021-08-09: 1000 mg via ORAL
  Filled 2021-08-09: qty 2

## 2021-08-09 MED ORDER — GABAPENTIN 300 MG PO CAPS: 300.0000 mg | ORAL_CAPSULE | Freq: Three times a day (TID) | ORAL | 0 refills | Status: AC | PRN

## 2021-08-09 NOTE — Progress Notes (Signed)
LLE venous duplex has been completed.  Preliminary results given to Abby, RN. ? ? ?Results can be found under chart review under CV PROC. ?08/09/2021 11:13 AM ?Latressa Harries RVT, RDMS ? ?

## 2021-08-09 NOTE — ED Triage Notes (Signed)
Pt reports left leg pain since yesterday. Pt reports pain that began in her shin and radiates up into thigh and into lower back ?

## 2021-08-09 NOTE — Discharge Instructions (Signed)
Please follow-up with your primary care provider to see how you are doing with your symptoms. ?

## 2021-08-09 NOTE — ED Provider Notes (Signed)
?[place] DEPT ?Provider Note ? ? ?CSN: [id number] ?Arrival date & time: 08/09/21  0807 ? ?  ? ?History ? ?Chief Complaint  ?Patient presents with  ? Leg Pain  ? ? ?Natasha Davidson is a 31 y.o. female presenting to ED with pain in her left leg.  She reports that she had abrupt onset of discomfort in her left lower leg yesterday afternoon, which since then has radiated up into her upper leg.  She says it is painful to walk and her when she flexes her foot.  She denies trauma, injuries to her back or lower extremity.  She denies history of DVT or PE, estrogen use, family history of clotting disorder.   ? ?HPI ? ?  ? ?Home Medications ?Prior to Admission medications   ?Medication Sig Start Date End Date Taking? Authorizing Provider  ?gabapentin (NEURONTIN) 300 MG capsule Take 1 capsule (300 mg total) by mouth 3 (three) times daily as needed for up to 30 doses. 08/09/21  Yes Gabriellah Rabel, Carola Rhine, MD  ?ibuprofen (ADVIL) 600 MG tablet Take 1 tablet (600 mg total) by mouth every 6 (six) hours as needed for up to 30 doses for moderate pain or mild pain. 08/09/21  Yes Geisha Abernathy, Carola Rhine, MD  ?albuterol (VENTOLIN HFA) 108 (90 Base) MCG/ACT inhaler Inhale 2 puffs into the lungs every 6 (six) hours as needed for wheezing or shortness of breath. 05/24/20   Lavonia Drafts, MD  ?amoxicillin (AMOXIL) 500 MG capsule Take 1 capsule (500 mg total) by mouth 3 (three) times daily. 06/09/21   Davonna Belling, MD  ?benzonatate (TESSALON) 100 MG capsule Take 1 capsule (100 mg total) by mouth every 8 (eight) hours. 02/21/21   Carlisle Cater, PA-C  ?oxyCODONE-acetaminophen (PERCOCET/ROXICET) 5-325 MG tablet Take 1 tablet by mouth every 6 (six) hours as needed for severe pain. 06/09/21   Davonna Belling, MD  ?   ? ?Allergies    ?Patient has no known allergies.   ? ?Review of Systems   ?Review of Systems ? ?Physical Exam ?Updated Vital Signs ?BP (!) 144/101 (BP Location: Left Arm)   Pulse 77   Temp 98.2 ?F (36.8 ?C)  (Oral)   Resp 16   SpO2 100%  ?Physical Exam ?Constitutional:   ?   General: She is not in acute distress. ?HENT:  ?   Head: Normocephalic and atraumatic.  ?Eyes:  ?   Conjunctiva/sclera: Conjunctivae normal.  ?   Pupils: Pupils are equal, round, and reactive to light.  ?Cardiovascular:  ?   Rate and Rhythm: Normal rate and regular rhythm.  ?Pulmonary:  ?   Effort: Pulmonary effort is normal. No respiratory distress.  ?Musculoskeletal:     ?   General: No swelling, tenderness, deformity or signs of injury. Normal range of motion.  ?Skin: ?   General: Skin is warm and dry.  ?Neurological:  ?   General: No focal deficit present.  ?   Mental Status: She is alert and oriented to person, place, and time. Mental status is at baseline.  ?   Sensory: No sensory deficit.  ?   Motor: No weakness.  ?Psychiatric:     ?   Mood and Affect: Mood normal.     ?   Behavior: Behavior normal.  ? ? ?ED Results / Procedures / Treatments   ?Labs ?(all labs ordered are listed, but only abnormal results are displayed) ?Labs Reviewed - No data to display ? ?EKG ?None ? ?Radiology ?VAS [place] LOWER  EXTREMITY VENOUS (DVT) (ONLY [facility] & [facility]) ? ?Result Date: 08/09/2021 ? Lower Venous DVT Study Patient Name:  Natasha Davidson  Date of Exam:   08/09/2021 Medical Rec #: [id number]       Accession #:    [id number] Date of Birth: 11/17/1990        Patient Gender: F Patient Age:   78 years Exam Location:  [facility] Procedure:      VAS [place] LOWER EXTREMITY VENOUS (DVT) Referring Phys: Zebulun Deman --------------------------------------------------------------------------------  Indications: Pain.  Comparison Study: No previous exams Performing Technologist: Jody Hill RVT, RDMS  Examination Guidelines: A complete evaluation includes B-mode imaging, spectral Doppler, color Doppler, and power Doppler as needed of all accessible portions of each vessel. Bilateral testing is considered an integral part of a complete examination. Limited examinations for  reoccurring indications may be performed as noted. The reflux portion of the exam is performed with the patient in reverse Trendelenburg.  +-----+---------------+---------+-----------+----------+--------------+ RIGHTCompressibilityPhasicitySpontaneityPropertiesThrombus Aging +-----+---------------+---------+-----------+----------+--------------+ CFV  Full           Yes      Yes                                 +-----+---------------+---------+-----------+----------+--------------+   +---------+---------------+---------+-----------+----------+--------------+ LEFT     CompressibilityPhasicitySpontaneityPropertiesThrombus Aging +---------+---------------+---------+-----------+----------+--------------+ CFV      Full           Yes      Yes                                 +---------+---------------+---------+-----------+----------+--------------+ SFJ      Full                                                        +---------+---------------+---------+-----------+----------+--------------+ FV Prox  Full           Yes      Yes                                 +---------+---------------+---------+-----------+----------+--------------+ FV Mid   Full           Yes      Yes                                 +---------+---------------+---------+-----------+----------+--------------+ FV DistalFull           Yes      Yes                                 +---------+---------------+---------+-----------+----------+--------------+ PFV      Full                                                        +---------+---------------+---------+-----------+----------+--------------+ POP      Full           Yes  Yes                                 +---------+---------------+---------+-----------+----------+--------------+ PTV      Full                                                        +---------+---------------+---------+-----------+----------+--------------+  PERO     Full                                                        +---------+---------------+---------+-----------+----------+--------------+     Summary: RIGHT: - No evidence of common femoral vein obstruction.  LEFT: - There is no evidence of deep vein thrombosis in the lower extremity. - There is no evidence of superficial venous thrombosis.  - No cystic structure found in the popliteal fossa.  *See table(s) above for measurements and observations. Electronically signed by Monica Martinez MD on 08/09/2021 at 5:03:24 PM.    Final    ? ?Procedures ?Procedures  ? ? ?Medications Ordered in ED ?Medications  ?ibuprofen (ADVIL) tablet 800 mg (800 mg Oral Given 08/09/21 0945)  ?acetaminophen (TYLENOL) tablet 1,000 mg (1,000 mg Oral Given 08/09/21 0945)  ? ? ?ED Course/ Medical Decision Making/ A&P ?Clinical Course as of 08/10/21 0809  ?Thu Aug 09, 2021  ?1101 Patient reassessed and her pain is improved with the ibuprofen, from 8/10 to 4/10.  No acute DVT noted on ultrasound.  I recommended that she continue with the ibuprofen and Tylenol at home, and also prescribed gabapentin for what I suspect is nerve pain.  She will follow-up with her PCP.  She verbalized understanding. [MT]  ?  ?Clinical Course User Index ?[MT] Wyvonnia Dusky, MD  ? ?                        ?Medical Decision Making ?Risk ?OTC drugs. ?Prescription drug management. ? ? ?Patient is here with left leg pain that began spontaneously yesterday.  Differential diagnosis would include DVT versus peripheral neuropathy versus other nerve impingement ? ?She is grossly neurologically intact, low suspicion for cauda equina syndrome or nerve root compression.  Do not believe she needs an emergent MRI at this time. ? ?Vascular ultrasound was ordered to the left lower extremity and personally reviewed and interpreted, showing no acute DVT ? ?Ibuprofen and Tylenol was given for pain with some improvement.  I do believe she is reasonably stable for  discharge ? ? ? ? ? ? ? ?Final Clinical Impression(s) / ED Diagnoses ?Final diagnoses:  ?Left leg pain  ? ? ?Rx / DC Orders ?ED Discharge Orders   ? ?      Ordered  ?  gabapentin (NEURONTIN) 300 MG capsule  3 times daily PRN       ? 08/09/21 1100  ?  ibuprofen (ADVIL) 600 MG tablet  Every 6 hours PRN       ? 05/04

## 2021-09-22 ENCOUNTER — Other Ambulatory Visit: Payer: Self-pay

## 2021-09-22 ENCOUNTER — Emergency Department (HOSPITAL_COMMUNITY)
Admission: EM | Admit: 2021-09-22 | Discharge: 2021-09-23 | Disposition: A | Discharge status: Home / Self Care | Payer: Self-pay | Attending: Emergency Medicine | Admitting: Emergency Medicine

## 2021-09-22 ENCOUNTER — Encounter (HOSPITAL_COMMUNITY): Payer: Self-pay

## 2021-09-22 DIAGNOSIS — R1031 Right lower quadrant pain: Secondary | ICD-10-CM | POA: Insufficient documentation

## 2021-09-22 LAB — COMPREHENSIVE METABOLIC PANEL
ALT: 11 U/L (ref 0–44)
AST: 15 U/L (ref 15–41)
Albumin: 3.7 g/dL (ref 3.5–5.0)
Alkaline Phosphatase: 99 U/L (ref 38–126)
Anion gap: 6 (ref 5–15)
BUN: 16 mg/dL (ref 6–20)
CO2: 27 mmol/L (ref 22–32)
Calcium: 9 mg/dL (ref 8.9–10.3)
Chloride: 111 mmol/L (ref 98–111)
Creatinine, Ser: 0.89 mg/dL (ref 0.44–1.00)
GFR, Estimated: >60 mL/min (ref >60)
Glucose, Bld: 115 mg/dL — ABNORMAL HIGH (ref 70–99)
Potassium: 3.6 mmol/L (ref 3.5–5.1)
Sodium: 144 mmol/L (ref 135–145)
Total Bilirubin: 0.4 mg/dL (ref 0.3–1.2)
Total Protein: 7.3 g/dL (ref 6.5–8.1)

## 2021-09-22 LAB — CBC
HCT: 41.3 % (ref 36.0–46.0)
Hemoglobin: 12.8 g/dL (ref 12.0–15.0)
MCH: 26 pg (ref 26.0–34.0)
MCHC: 31 g/dL (ref 30.0–36.0)
MCV: 83.9 fL (ref 80.0–100.0)
Platelets: 336 10*3/uL (ref 150–400)
RBC: 4.92 MIL/uL (ref 3.87–5.11)
RDW: 14.4 % (ref 11.5–15.5)
WBC: 8.6 10*3/uL (ref 4.0–10.5)
nRBC: 0 % (ref 0.0–0.2)

## 2021-09-22 LAB — LIPASE, BLOOD: Lipase: 27 U/L (ref 11–51)

## 2021-09-22 LAB — I-STAT BETA HCG BLOOD, ED (MC, WL, AP ONLY): I-stat hCG, quantitative: <5 m[IU]/mL (ref <5)

## 2021-09-22 NOTE — ED Provider Triage Note (Signed)
Emergency Medicine Provider Triage Evaluation Note  Natasha Davidson , a 31 y.o. female  was evaluated in triage.  Pt complains of 3 days of abd pain. Last period was April 22nd. States pain is lower, hx of tubal ligation, had a + pregnancy test.  - no vag bleeding currently.   No urinary sx  Review of Systems  Positive: R lower abd pain Negative: Fever   Physical Exam  BP (!) 141/97 (BP Location: Left Arm)   Pulse 93   Temp 98.2 F (36.8 C) (Oral)   Resp 17   Ht 5\' 4"  (1.626 m)   Wt 118.8 kg   SpO2 99%   BMI 44.97 kg/m  Gen:   Awake, no distress   Resp:  Normal effort  MSK:   Moves extremities without difficulty  Other:  Abd soft  Medical Decision Making  Medically screening exam initiated at 9:20 PM.  Appropriate orders placed.  CHANTEA SURACE was informed that the remainder of the evaluation will be completed by another provider, this initial triage assessment does not replace that evaluation, and the importance of remaining in the ED until their evaluation is complete.  Labs - istat HCG -----> some concern for ectopic if +   [name], Gailen Shelter 09/22/21 2123

## 2021-09-22 NOTE — ED Notes (Signed)
Labeled specimen cup provided to pt for urine collection per MD order. ENMiles 

## 2021-09-22 NOTE — ED Triage Notes (Signed)
Patient said her period is very late by 23 days. Has her tubes tied in 2018. Back pain on her right lower side, abdominal cramping. Took some pregnancy tests and all 4 came back positive.

## 2021-09-23 ENCOUNTER — Emergency Department (HOSPITAL_COMMUNITY): Payer: Self-pay

## 2021-09-23 LAB — URINALYSIS, ROUTINE W REFLEX MICROSCOPIC
Bilirubin Urine: NEGATIVE
Glucose, UA: NEGATIVE mg/dL
Hgb urine dipstick: NEGATIVE
Ketones, ur: NEGATIVE mg/dL
Nitrite: NEGATIVE
Protein, ur: NEGATIVE mg/dL
Specific Gravity, Urine: 1.029 (ref 1.005–1.030)
pH: 6 (ref 5.0–8.0)

## 2021-09-23 MED ORDER — KETOROLAC TROMETHAMINE 30 MG/ML IJ SOLN
30.0000 mg | Freq: Once | INTRAMUSCULAR | Status: AC
Start: 2021-09-23 — End: 2021-09-23
  Administered 2021-09-23: 30 mg via INTRAVENOUS
  Filled 2021-09-23: qty 1

## 2021-09-23 MED ORDER — IOHEXOL 300 MG/ML  SOLN
100.0000 mL | Freq: Once | INTRAMUSCULAR | Status: AC | PRN
  Administered 2021-09-23: 100 mL via INTRAVENOUS

## 2021-09-23 MED ORDER — NAPROXEN 375 MG PO TABS: 375.0000 mg | ORAL_TABLET | Freq: Two times a day (BID) | ORAL | 0 refills | Status: DC

## 2021-09-23 NOTE — Discharge Instructions (Signed)
Your CT scan was negative for any acute findings.  You are not pregnant.  I am unsure of the cause of your pain at this time however if your pain becomes severe or recurrent you should return during daytime hours and you may need a pelvic ultrasound.  Otherwise please call first thing in the morning on Monday with your primary care physician or your gynecologist for pelvic examination and further evaluation.  You may take over-the-counter pain medication such as Tylenol.  I prescribed high-dose anti-inflammatory medications for pain control which may also help.  Abdominal (belly) pain can be caused by many things. Your caregiver performed an examination and possibly ordered blood/urine tests and imaging (CT scan, x-rays, ultrasound). Many cases can be observed and treated at home after initial evaluation in the emergency department. Even though you are being discharged home, abdominal pain can be unpredictable. Therefore, you need a repeated exam if your pain does not resolve, returns, or worsens. Most patients with abdominal pain don't have to be admitted to the hospital or have surgery, but serious problems like appendicitis and gallbladder attacks can start out as nonspecific pain. Many abdominal conditions cannot be diagnosed in one visit, so follow-up evaluations are very important. SEEK IMMEDIATE MEDICAL ATTENTION IF: The pain does not go away or becomes severe.  A temperature above 101 develops.  Repeated vomiting occurs (multiple episodes).  The pain becomes localized to portions of the abdomen. The right side could possibly be appendicitis. In an adult, the left lower portion of the abdomen could be colitis or diverticulitis.  Blood is being passed in stools or vomit (bright red or black tarry stools).  Return also if you develop chest pain, difficulty breathing, dizziness or fainting, or become confused, poorly responsive, or inconsolable (young children).

## 2021-09-23 NOTE — ED Notes (Signed)
Patient transported to CT 

## 2021-09-23 NOTE — ED Provider Notes (Signed)
[facility]-EMERGENCY DEPT Provider Note   CSN: [id number] Arrival date & time: 09/22/21  2105     History  Chief Complaint  Patient presents with   Abdominal Cramping    Natasha Davidson is a 31 y.o. female.  Patient presents with right lower quadrant abdominal pain and cramping which is intermittent for the past 48 hours.  She had an abnormal.  And was concerned she might be pregnant, took pregnancy test at home that were positive however they are negative here after triage labs.  She denies urinary or vaginal symptoms.  She denies fevers, chills, nausea, vomiting, diarrhea or constipation.  The history is provided by the patient.  Abdominal Cramping This is a new problem. The current episode started more than 2 days ago. The problem occurs hourly. The problem has not changed since onset.Associated symptoms include abdominal pain. Nothing aggravates the symptoms. Nothing relieves the symptoms.       Home Medications Prior to Admission medications   Medication Sig Start Date End Date Taking? Authorizing Provider  albuterol (VENTOLIN HFA) 108 (90 Base) MCG/ACT inhaler Inhale 2 puffs into the lungs every 6 (six) hours as needed for wheezing or shortness of breath. Patient not taking: Reported on 09/22/2021 05/24/20   Jene Every, MD  amoxicillin (AMOXIL) 500 MG capsule Take 1 capsule (500 mg total) by mouth 3 (three) times daily. Patient not taking: Reported on 09/22/2021 06/09/21   Benjiman Core, MD  benzonatate (TESSALON) 100 MG capsule Take 1 capsule (100 mg total) by mouth every 8 (eight) hours. Patient not taking: Reported on 09/22/2021 02/21/21   Renne Crigler, PA-C  gabapentin (NEURONTIN) 300 MG capsule Take 1 capsule (300 mg total) by mouth 3 (three) times daily as needed for up to 30 doses. Patient not taking: Reported on 09/22/2021 08/09/21   Terald Sleeper, MD  ibuprofen (ADVIL) 600 MG tablet Take 1 tablet (600 mg total) by mouth every 6 (six) hours  as needed for up to 30 doses for moderate pain or mild pain. Patient not taking: Reported on 09/22/2021 08/09/21   Terald Sleeper, MD  oxyCODONE-acetaminophen (PERCOCET/ROXICET) 5-325 MG tablet Take 1 tablet by mouth every 6 (six) hours as needed for severe pain. Patient not taking: Reported on 09/22/2021 06/09/21   Benjiman Core, MD      Allergies    Patient has no known allergies.    Review of Systems   Review of Systems  Gastrointestinal:  Positive for abdominal pain.    Physical Exam Updated Vital Signs BP (!) 135/91   Pulse 77   Temp 98.2 F (36.8 C) (Oral)   Resp 20   Ht 5\' 4"  (1.626 m)   Wt 118.8 kg   SpO2 98%   BMI 44.97 kg/m  Physical Exam Vitals and nursing note reviewed.  Constitutional:      General: She is not in acute distress.    Appearance: She is well-developed. She is not diaphoretic.  HENT:     Head: Normocephalic and atraumatic.     Right Ear: External ear normal.     Left Ear: External ear normal.     Nose: Nose normal.     Mouth/Throat:     Mouth: Mucous membranes are moist.  Eyes:     General: No scleral icterus.    Conjunctiva/sclera: Conjunctivae normal.  Cardiovascular:     Rate and Rhythm: Normal rate and regular rhythm.     Heart sounds: Normal heart sounds. No murmur heard.  No friction rub. No gallop.  Pulmonary:     Effort: Pulmonary effort is normal. No respiratory distress.     Breath sounds: Normal breath sounds.  Abdominal:     General: Bowel sounds are normal. There is no distension.     Palpations: Abdomen is soft. There is no mass.     Tenderness: There is abdominal tenderness in the right lower quadrant. There is no guarding.  Musculoskeletal:     Cervical back: Normal range of motion.  Skin:    General: Skin is warm and dry.  Neurological:     Mental Status: She is alert and oriented to person, place, and time.  Psychiatric:        Behavior: Behavior normal.     ED Results / Procedures / Treatments   Labs (all  labs ordered are listed, but only abnormal results are displayed) Labs Reviewed  COMPREHENSIVE METABOLIC PANEL - Abnormal; Notable for the following components:      Result Value   Glucose, Bld 115 (*)    All other components within normal limits  URINALYSIS, ROUTINE W REFLEX MICROSCOPIC - Abnormal; Notable for the following components:   APPearance HAZY (*)    Leukocytes,Ua TRACE (*)    Bacteria, UA RARE (*)    All other components within normal limits  LIPASE, BLOOD  CBC  I-STAT BETA HCG BLOOD, ED ([facility], [facility], [facility] ONLY)    EKG None  Radiology No results found.  Procedures Procedures    Medications Ordered in ED Medications  iohexol (OMNIPAQUE) 300 MG/ML solution 100 mL (100 mLs Intravenous Contrast Given 09/23/21 0147)    ED Course/ Medical Decision Making/ A&P                           Medical Decision Making Patient here with RLQ pain. Differential diagnosis of her lower abdominal considerations include pelvic inflammatory disease, ectopic pregnancy, appendicitis, urinary calculi, primary dysmenorrhea, septic abortion, ruptured ovarian cyst or tumor, ovarian torsion, tubo-ovarian abscess, degeneration of fibroid, endometriosis, diverticulitis, cystitis. Her pregnancy test is negative.  After review of all data points there is no evidence of acute appendicitis.  I have extremely low suspicion for ovarian torsion, no evidence of large cyst or swelling of the ovary on CT examination.  Patient's pain is only moderate at best and she declined any pain medications here.  Will discharge with anti-inflammatory medications, close PCP or OB/GYN follow-up and return precautions.  Amount and/or Complexity of Data Reviewed Labs: ordered.    Details: I ordered and reviewed labs which shows no elevated leukocytosis, mildly elevated blood glucose, lipase within normal limits, negative pregnancy test. Radiology: ordered and independent interpretation performed.    Details: I ordered and  interpreted CT scan which shows no acute finding  Risk Prescription drug management.           Final Clinical Impression(s) / ED Diagnoses Final diagnoses:  None    Rx / DC Orders ED Discharge Orders     None         Arthor Captain, PA-C 09/23/21 0514    Maia Plan, MD 09/23/21 2349

## 2021-09-25 ENCOUNTER — Telehealth (HOSPITAL_COMMUNITY): Payer: Self-pay

## 2021-09-25 NOTE — Telephone Encounter (Signed)
Pt. Was seen in the ED on 09/22/2021 and had a question concerning a lab that was drawn.   All questions answered and pt verbalized understanding.

## 2021-09-30 ENCOUNTER — Emergency Department (HOSPITAL_COMMUNITY)
Admission: EM | Admit: 2021-09-30 | Discharge: 2021-09-30 | Disposition: A | Discharge status: Home / Self Care | Payer: Self-pay | Attending: Emergency Medicine | Admitting: Emergency Medicine

## 2021-09-30 ENCOUNTER — Emergency Department (HOSPITAL_COMMUNITY): Payer: Self-pay

## 2021-09-30 ENCOUNTER — Encounter (HOSPITAL_COMMUNITY): Payer: Self-pay | Admitting: Emergency Medicine

## 2021-09-30 ENCOUNTER — Other Ambulatory Visit: Payer: Self-pay

## 2021-09-30 DIAGNOSIS — F17210 Nicotine dependence, cigarettes, uncomplicated: Secondary | ICD-10-CM | POA: Insufficient documentation

## 2021-09-30 DIAGNOSIS — E119 Type 2 diabetes mellitus without complications: Secondary | ICD-10-CM | POA: Insufficient documentation

## 2021-09-30 DIAGNOSIS — N939 Abnormal uterine and vaginal bleeding, unspecified: Secondary | ICD-10-CM | POA: Insufficient documentation

## 2021-09-30 DIAGNOSIS — I10 Essential (primary) hypertension: Secondary | ICD-10-CM | POA: Insufficient documentation

## 2021-09-30 DIAGNOSIS — Z716 Tobacco abuse counseling: Secondary | ICD-10-CM | POA: Insufficient documentation

## 2021-09-30 DIAGNOSIS — R109 Unspecified abdominal pain: Secondary | ICD-10-CM

## 2021-09-30 LAB — CBC
HCT: 40.7 % (ref 36.0–46.0)
Hemoglobin: 12.8 g/dL (ref 12.0–15.0)
MCH: 26.2 pg (ref 26.0–34.0)
MCHC: 31.4 g/dL (ref 30.0–36.0)
MCV: 83.4 fL (ref 80.0–100.0)
Platelets: 321 10*3/uL (ref 150–400)
RBC: 4.88 MIL/uL (ref 3.87–5.11)
RDW: 14.7 % (ref 11.5–15.5)
WBC: 6.7 10*3/uL (ref 4.0–10.5)
nRBC: 0 % (ref 0.0–0.2)

## 2021-09-30 LAB — COMPREHENSIVE METABOLIC PANEL
ALT: 12 U/L (ref 0–44)
AST: 14 U/L — ABNORMAL LOW (ref 15–41)
Albumin: 3.7 g/dL (ref 3.5–5.0)
Alkaline Phosphatase: 91 U/L (ref 38–126)
Anion gap: 5 (ref 5–15)
BUN: 11 mg/dL (ref 6–20)
CO2: 24 mmol/L (ref 22–32)
Calcium: 8.6 mg/dL — ABNORMAL LOW (ref 8.9–10.3)
Chloride: 108 mmol/L (ref 98–111)
Creatinine, Ser: 0.84 mg/dL (ref 0.44–1.00)
GFR, Estimated: >60 mL/min (ref >60)
Glucose, Bld: 93 mg/dL (ref 70–99)
Potassium: 3.6 mmol/L (ref 3.5–5.1)
Sodium: 137 mmol/L (ref 135–145)
Total Bilirubin: 0.7 mg/dL (ref 0.3–1.2)
Total Protein: 7.1 g/dL (ref 6.5–8.1)

## 2021-09-30 LAB — URINALYSIS, ROUTINE W REFLEX MICROSCOPIC
Bacteria, UA: NONE SEEN
Bilirubin Urine: NEGATIVE
Glucose, UA: NEGATIVE mg/dL
Hgb urine dipstick: NEGATIVE
Ketones, ur: 5 mg/dL — AB
Nitrite: NEGATIVE
Protein, ur: NEGATIVE mg/dL
Specific Gravity, Urine: 1.028 (ref 1.005–1.030)
pH: 5 (ref 5.0–8.0)

## 2021-09-30 LAB — I-STAT BETA HCG BLOOD, ED (MC, WL, AP ONLY): I-stat hCG, quantitative: <5 m[IU]/mL (ref <5)

## 2021-09-30 LAB — LIPASE, BLOOD: Lipase: 26 U/L (ref 11–51)

## 2021-09-30 MED ORDER — DICYCLOMINE HCL 20 MG PO TABS: 20.0000 mg | ORAL_TABLET | Freq: Two times a day (BID) | ORAL | 0 refills | Status: AC

## 2021-09-30 MED ORDER — SUCRALFATE 1 G PO TABS: 1.0000 g | ORAL_TABLET | Freq: Three times a day (TID) | ORAL | 0 refills | Status: AC

## 2021-09-30 NOTE — ED Triage Notes (Signed)
Patient states she was recently here for same complaint. States she has continued abdominal cramping and took pregnancy tests that the lines have appeared darker. Patient requesting an [place].

## 2021-10-29 ENCOUNTER — Emergency Department (HOSPITAL_COMMUNITY)
Admission: EM | Admit: 2021-10-29 | Discharge: 2021-10-29 | Disposition: A | Discharge status: Home / Self Care | Payer: Self-pay | Attending: Emergency Medicine | Admitting: Emergency Medicine

## 2021-10-29 ENCOUNTER — Emergency Department (HOSPITAL_COMMUNITY): Payer: Self-pay

## 2021-10-29 ENCOUNTER — Other Ambulatory Visit: Payer: Self-pay

## 2021-10-29 ENCOUNTER — Encounter (HOSPITAL_COMMUNITY): Payer: Self-pay

## 2021-10-29 DIAGNOSIS — S99921A Unspecified injury of right foot, initial encounter: Secondary | ICD-10-CM

## 2021-10-29 DIAGNOSIS — S91209A Unspecified open wound of unspecified toe(s) with damage to nail, initial encounter: Secondary | ICD-10-CM

## 2021-10-29 DIAGNOSIS — W228XXA Striking against or struck by other objects, initial encounter: Secondary | ICD-10-CM | POA: Insufficient documentation

## 2021-10-29 DIAGNOSIS — S91201A Unspecified open wound of right great toe with damage to nail, initial encounter: Secondary | ICD-10-CM | POA: Insufficient documentation

## 2021-10-29 MED ORDER — LIDOCAINE HCL 2 % IJ SOLN
10.0000 mL | Freq: Once | INTRAMUSCULAR | Status: AC
  Administered 2021-10-29: 200 mg
  Filled 2021-10-29: qty 20

## 2021-10-29 NOTE — ED Provider Triage Note (Signed)
Emergency Medicine Provider Triage Evaluation Note  Natasha DUMAIS , a 31 y.o. female  was evaluated in triage.  Pt complains of right big toenail injury that occurred yesterday.  Patient states that when she was younger, she had stubbed that toe and the nail did not grow back normally.  So she gets acrylic applied to that toe.  She stubbed that toe yesterday and both the nail and the acrylic seem to have pulled back and are about to fall off.  She endorses significant pain of the toe and the toenail.  Denies numbness and tingling.  She has no other complaints..  Review of Systems  Positive:  Negative:   Physical Exam  BP (!) 175/96   Pulse 77   Temp 98.2 F (36.8 C) (Oral)   Resp 18   Ht 5\' 4"  (1.626 m)   Wt 117.9 kg   SpO2 100%   BMI 44.63 kg/m  Gen:   Awake, no distress   Resp:  Normal effort  MSK:   Moves extremities without difficulty  Other:  Right big toenail with nailbed injury.  The nail is loose and mobile.  No obvious bleeding or lacerations.  Medical Decision Making  Medically screening exam initiated at 4:33 PM.  Appropriate orders placed.  EMMANUELLE HIBBITTS was informed that the remainder of the evaluation will be completed by another provider, this initial triage assessment does not replace that evaluation, and the importance of remaining in the ED until their evaluation is complete.     [name], Janell Quiet 10/29/21 [phone]

## 2021-10-29 NOTE — ED Triage Notes (Signed)
Pt reports kicking her bed on accident yesterday and her left great toenail ripped off. Pt now endorses pain and is concerned the toe is becoming infected.

## 2021-10-29 NOTE — ED Provider Notes (Signed)
[facility]   CSN: [id number] Arrival date & time: 10/29/21  1604     History  Chief Complaint  Patient presents with   Toe Injury    Natasha Davidson is a 31 y.o. female who presents to the emergency department for evaluation of a right great toe injury.  Patient states that she kicked her bed yesterday which seemed to involve most of the toenail off of her nailbed.  Patient states that she had a toenail injury that removed her toenail when she was younger.  The nail grew back only just a little bit, so she has gotten acrylic over the toe nail.  She is concerned that there may be some infection as she took the bandage off of the toenail earlier today and it was malodorous.  Denies fevers, chills, nausea, vomiting diarrhea. HPI     Home Medications Prior to Admission medications   Medication Sig Start Date End Date Taking? Authorizing Provider  albuterol (VENTOLIN HFA) 108 (90 Base) MCG/ACT inhaler Inhale 2 puffs into the lungs every 6 (six) hours as needed for wheezing or shortness of breath. Patient not taking: Reported on 09/22/2021 05/24/20   Jene Every, MD  amoxicillin (AMOXIL) 500 MG capsule Take 1 capsule (500 mg total) by mouth 3 (three) times daily. Patient not taking: Reported on 09/22/2021 06/09/21   Benjiman Core, MD  benzonatate (TESSALON) 100 MG capsule Take 1 capsule (100 mg total) by mouth every 8 (eight) hours. Patient not taking: Reported on 09/22/2021 02/21/21   Renne Crigler, PA-C  dicyclomine (BENTYL) 20 MG tablet Take 1 tablet (20 mg total) by mouth 2 (two) times daily for 5 days. 09/30/21 10/05/21  Sloan Leiter, DO  gabapentin (NEURONTIN) 300 MG capsule Take 1 capsule (300 mg total) by mouth 3 (three) times daily as needed for up to 30 doses. Patient not taking: Reported on 09/22/2021 08/09/21   Terald Sleeper, MD  ibuprofen (ADVIL) 600 MG tablet Take 1 tablet (600 mg total) by mouth every 6 (six) hours as needed  for up to 30 doses for moderate pain or mild pain. Patient not taking: Reported on 09/22/2021 08/09/21   Terald Sleeper, MD  naproxen (NAPROSYN) 375 MG tablet Take 1 tablet (375 mg total) by mouth 2 (two) times daily with a meal. 09/23/21   Arthor Captain, PA-C  oxyCODONE-acetaminophen (PERCOCET/ROXICET) 5-325 MG tablet Take 1 tablet by mouth every 6 (six) hours as needed for severe pain. Patient not taking: Reported on 09/22/2021 06/09/21   Benjiman Core, MD  sucralfate (CARAFATE) 1 g tablet Take 1 tablet (1 g total) by mouth 4 (four) times daily -  with meals and at bedtime for 7 days. 09/30/21 10/07/21  Sloan Leiter, DO      Allergies    Patient has no known allergies.    Review of Systems   Review of Systems  Constitutional:  Negative for chills and fever.  Skin:  Positive for wound.    Physical Exam Updated Vital Signs BP (!) 175/96   Pulse 77   Temp 98.2 F (36.8 C) (Oral)   Resp 18   Ht 5\' 4"  (1.626 m)   Wt 117.9 kg   SpO2 100%   BMI 44.63 kg/m  Physical Exam Vitals and nursing note reviewed.  Constitutional:      General: She is not in acute distress.    Appearance: She is not ill-appearing.  HENT:     Head: Atraumatic.  Eyes:     Conjunctiva/sclera: Conjunctivae normal.  Cardiovascular:     Rate and Rhythm: Normal rate and regular rhythm.     Pulses: Normal pulses.          Dorsalis pedis pulses are 2+ on the left side.     Heart sounds: No murmur heard. Pulmonary:     Effort: Pulmonary effort is normal. No respiratory distress.     Breath sounds: Normal breath sounds.  Abdominal:     General: Abdomen is flat. There is no distension.     Palpations: Abdomen is soft.     Tenderness: There is no abdominal tenderness.  Musculoskeletal:        General: Normal range of motion.     Cervical back: Normal range of motion.  Feet:     Left foot:     Toenail Condition: Fungal disease present.    Comments: Left great toe tender to palpation.  She is able to wiggle  the toes.  The toenail itself is mobile, about halfway avulsed off the nailbed. Skin:    General: Skin is warm and dry.     Capillary Refill: Capillary refill takes less than 2 seconds.  Neurological:     General: No focal deficit present.     Mental Status: She is alert.  Psychiatric:        Mood and Affect: Mood normal.     ED Results / Procedures / Treatments   Labs (all labs ordered are listed, but only abnormal results are displayed) Labs Reviewed - No data to display  EKG None  Radiology DG Foot Complete Right  Result Date: 10/29/2021 CLINICAL DATA:  stubbed right big toe with nail injury EXAM: RIGHT FOOT COMPLETE - 3+ VIEW COMPARISON:  None Available. FINDINGS: There is no evidence of fracture or dislocation. There is no evidence of arthropathy or other focal bone abnormality. Soft tissues are unremarkable. IMPRESSION: Negative. Electronically Signed   By: Tish Frederickson M.D.   On: 10/29/2021 17:59    Procedures .Nail Removal  Date/Time: 10/29/2021 7:30 PM  Performed by: Janell Quiet, PA-C Authorized by: Janell Quiet, PA-C   Consent:    Consent obtained:  Verbal   Consent given by:  Patient   Risks, benefits, and alternatives were discussed: yes     Risks discussed:  Bleeding, incomplete removal, infection, permanent nail deformity and pain   Alternatives discussed:  Alternative treatment Universal protocol:    Procedure explained and questions answered to patient or proxy's satisfaction: yes     Imaging studies available: yes     Immediately prior to procedure, a time out was called: yes     Patient identity confirmed:  Verbally with patient Location:    Foot:  R big toe Pre-procedure details:    Skin preparation:  Alcohol Anesthesia:    Anesthesia method:  Nerve block   Block needle gauge:  25 G   Block anesthetic:  Lidocaine 2% w/o epi   Block technique:  Digital   Block injection procedure:  Anatomic landmarks identified, introduced needle,  incremental injection, anatomic landmarks palpated and negative aspiration for blood Nail Removal:    Nail removed:  Complete   Nail bed repaired: no     Removed nail replaced and anchored: no (Nail appears to have onychomycosis upon removal)   Post-procedure details:    Dressing:  Xeroform gauze and gauze roll   Procedure completion:  Tolerated    Medications Ordered in ED Medications  lidocaine (  XYLOCAINE) 2 % (with pres) injection 200 mg (200 mg Other Given 10/29/21 1822)    ED Course/ Medical Decision Making/ A&P                           Medical Decision Making Amount and/or Complexity of Data Reviewed Radiology: ordered.  Risk Prescription drug management.   Patient presents to the evaluation for right great toe injury that occurred yesterday.  Vitals without significant abnormality.  On exam, the toe was quite tender to palpation with a mobile toenail.  I ordered interpreted x-ray which is negative for fracture or dislocation.  I discussed with patient 2 options to include removing the nail which may cause permanent nail loss, or clipping the toenail back and wrapping it and allowing it to grow out.  Patient states that she would prefer to have the nail completely removed and is okay with the fact that it may not grow back.  Procedure was completed as described above.  Upon removal of the nail, I was concerned about an underlying fungal infection.  The toenail was cleaned out extensively with povidone-iodine solution.  Ultimately, I would like her to follow-up with a podiatrist.  Referral to [facility] was provided.  Patient expresses understanding and is amenable to plan.  RICE protocol indicated.  Discharged home in good condition.        Final Clinical Impression(s) / ED Diagnoses Final diagnoses:  Injury of toe on right foot, initial encounter  Avulsion of toenail, initial encounter    Rx / DC Orders ED Discharge Orders     None         Delight Ovens 10/29/21 1934    Jacalyn Lefevre, MD 10/29/21 2254

## 2021-10-29 NOTE — Discharge Instructions (Signed)
Your x-ray was negative for fracture today, however ever we did take the nail off of your big toe.  Given the state of the big toe, I do not think it would be a very good idea to reinsert the toenail back in as it could cause worsening infection.  I given you referral to podiatry-please call them tomorrow morning to schedule follow-up appointments.  If you notice any signs of infection including redness, swelling, tenderness or drainage, please return to the emergency department.  For pain relief, I would recommend elevating the leg, applying ice as frequently as possible, and taking Tylenol or Motrin.

## 2021-10-29 NOTE — ED Notes (Signed)
I provided reinforced discharge education based off of discharge summary/care provided. Pt acknowledged and understood my education. Pt had no further questions/concerns for provider/myself.   

## 2021-12-05 ENCOUNTER — Encounter: Payer: Self-pay | Admitting: Student

## 2022-05-20 ENCOUNTER — Encounter: Payer: Self-pay | Admitting: Emergency Medicine

## 2022-05-20 ENCOUNTER — Emergency Department: Payer: Self-pay

## 2022-05-20 ENCOUNTER — Emergency Department
Admission: EM | Admit: 2022-05-20 | Discharge: 2022-05-20 | Disposition: A | Discharge status: Home / Self Care | Payer: Self-pay | Attending: Emergency Medicine | Admitting: Emergency Medicine

## 2022-05-20 ENCOUNTER — Other Ambulatory Visit: Payer: Self-pay

## 2022-05-20 DIAGNOSIS — U071 COVID-19: Secondary | ICD-10-CM | POA: Insufficient documentation

## 2022-05-20 LAB — RESP PANEL BY RT-PCR (RSV, FLU A&B, COVID)  RVPGX2
Influenza A by PCR: NEGATIVE
Influenza B by PCR: NEGATIVE
Resp Syncytial Virus by PCR: NEGATIVE
SARS Coronavirus 2 by RT PCR: POSITIVE — AB

## 2022-05-20 MED ORDER — IPRATROPIUM-ALBUTEROL 0.5-2.5 (3) MG/3ML IN SOLN
3.0000 mL | Freq: Once | RESPIRATORY_TRACT | Status: AC
  Administered 2022-05-20: 3 mL via RESPIRATORY_TRACT
  Filled 2022-05-20: qty 3

## 2022-05-20 NOTE — ED Triage Notes (Signed)
Pt to ED for cough and congestion for past few days. Denies fevers. Has been around family with covid. Reports rib pain with cough

## 2022-05-20 NOTE — ED Provider Notes (Signed)
   [facility] Provider Note    Event Date/Time   First MD Initiated Contact with Patient 05/20/22 [phone]     (approximate)   History   Cough   HPI  Natasha Davidson is a 32 y.o. female who presents with complaints of congestion, cough, fatigue for several days.  Family members recently diagnosed with COVID.  No significant shortness of breath      Physical Exam   Triage Vital Signs: ED Triage Vitals  Enc Vitals Group     BP 05/20/22 0942 (!) 144/86     Pulse Rate 05/20/22 0942 94     Resp 05/20/22 0942 12     Temp 05/20/22 0942 98.4 F (36.9 C)     Temp Source 05/20/22 0942 Oral     SpO2 05/20/22 0942 94 %     Weight 05/20/22 0946 118.8 kg (262 lb)     Height 05/20/22 0946 1.626 m (5\' 4" )     Head Circumference --      Peak Flow --      Pain Score 05/20/22 0945 2     Pain Loc --      Pain Edu? --      Excl. in [place]? --     Most recent vital signs: Vitals:   05/20/22 0942  BP: (!) 144/86  Pulse: 94  Resp: 12  Temp: 98.4 F (36.9 C)  SpO2: 94%     General: Awake, no distress.  CV:  Good peripheral perfusion.  Resp:  Normal effort.  Scattered minimal wheezing Abd:  No distention.  Other:     ED Results / Procedures / Treatments   Labs (all labs ordered are listed, but only abnormal results are displayed) Labs Reviewed  RESP PANEL BY RT-PCR (RSV, FLU A&B, COVID)  RVPGX2 - Abnormal; Notable for the following components:      Result Value   SARS Coronavirus 2 by RT PCR POSITIVE (*)    All other components within normal limits     EKG     RADIOLOGY Chest x-ray viewed interpret by me, no pneumonia    PROCEDURES:  Critical Care performed:   Procedures   MEDICATIONS ORDERED IN ED: Medications  ipratropium-albuterol (DUONEB) 0.5-2.5 (3) MG/3ML nebulizer solution 3 mL (3 mLs Nebulization Given 05/20/22 1017)     IMPRESSION / MDM / [place] / ED COURSE  I reviewed the triage vital signs and the nursing  notes. Patient's presentation is most consistent with acute illness / injury with system symptoms.  Patient presents with symptoms as above, highly suspect for viral syndrome such as influenza or COVID-19.  Will check chest x-ray to rule out pneumonia  Chest x-ray is unremarkable, respiratory PCR positive for COVID-19  Recommend supportive care, outpatient follow-up, return precautions discussed    Clinical Course as of 05/20/22 1210  Mon May 20, 2022  1112 DG Chest 2 View [RK]    Clinical Course User Index [RK] Lavonia Drafts, MD     FINAL CLINICAL IMPRESSION(S) / ED DIAGNOSES   Final diagnoses:  COVID-19     Rx / DC Orders   ED Discharge Orders     None        Note:  This document was prepared using Dragon voice recognition software and may include unintentional dictation errors.   Lavonia Drafts, MD 05/20/22 1212

## 2022-05-29 ENCOUNTER — Encounter: Payer: Self-pay | Admitting: Emergency Medicine

## 2022-05-29 ENCOUNTER — Emergency Department
Admission: EM | Admit: 2022-05-29 | Discharge: 2022-05-29 | Disposition: A | Discharge status: Home / Self Care | Payer: Self-pay | Attending: Emergency Medicine | Admitting: Emergency Medicine

## 2022-05-29 DIAGNOSIS — Z1152 Encounter for screening for COVID-19: Secondary | ICD-10-CM | POA: Insufficient documentation

## 2022-05-29 DIAGNOSIS — J020 Streptococcal pharyngitis: Secondary | ICD-10-CM | POA: Insufficient documentation

## 2022-05-29 LAB — RESP PANEL BY RT-PCR (RSV, FLU A&B, COVID)  RVPGX2
Influenza A by PCR: NEGATIVE
Influenza B by PCR: NEGATIVE
Resp Syncytial Virus by PCR: NEGATIVE
SARS Coronavirus 2 by RT PCR: NEGATIVE

## 2022-05-29 LAB — GROUP A STREP BY PCR: Group A Strep by PCR: DETECTED — AB

## 2022-05-29 MED ORDER — AMOXICILLIN 400 MG/5ML PO SUSR: 500.0000 mg | Freq: Three times a day (TID) | ORAL | 0 refills | Status: AC

## 2022-05-29 MED ORDER — AMOXICILLIN 400 MG/5ML PO SUSR: 500.0000 mg | Freq: Three times a day (TID) | ORAL | 0 refills | Status: DC

## 2022-05-29 NOTE — ED Triage Notes (Signed)
Pt reports fever, chills, sore throat since today. Reports covid last week. Flu shot given Monday. SOB with exertion.

## 2022-05-29 NOTE — ED Provider Notes (Signed)
[facility] Provider Note  Patient Contact: 6:42 PM (approximate)   History   Sore Throat and Chills   HPI  Natasha Davidson is a 32 y.o. female presents to the emergency department with pharyngitis for the past 24 hours.  No associated rhinorrhea, nasal congestion or nonproductive cough.  Patient is speaking in complete sentences and maintaining her own secretions.      Physical Exam   Triage Vital Signs: ED Triage Vitals  Enc Vitals Group     BP 05/29/22 1714 (!) 147/77     Pulse Rate 05/29/22 1714 (!) 118     Resp 05/29/22 1714 18     Temp 05/29/22 1714 99.7 F (37.6 C)     Temp Source 05/29/22 1714 Oral     SpO2 05/29/22 1714 97 %     Weight 05/29/22 1715 262 lb 5.6 oz (119 kg)     Height 05/29/22 1715 5' 4"$  (1.626 m)     Head Circumference --      Peak Flow --      Pain Score 05/29/22 1715 10     Pain Loc --      Pain Edu? --      Excl. in [place]? --     Most recent vital signs: Vitals:   05/29/22 1714  BP: (!) 147/77  Pulse: (!) 118  Resp: 18  Temp: 99.7 F (37.6 C)  SpO2: 97%     Constitutional: Alert and oriented. Patient is lying supine. Eyes: Conjunctivae are normal. PERRL. EOMI. Head: Atraumatic. ENT:      Ears: Tympanic membranes are mildly injected with mild effusion bilaterally.       Nose: No congestion/rhinnorhea.      Mouth/Throat: Mucous membranes are moist. Posterior pharynx is mildly erythematous.  Hematological/Lymphatic/Immunilogical: No cervical lymphadenopathy.  Cardiovascular: Normal rate, regular rhythm. Normal S1 and S2.  Good peripheral circulation. Respiratory: Normal respiratory effort without tachypnea or retractions. Lungs CTAB. Good air entry to the bases with no decreased or absent breath sounds. Gastrointestinal: Bowel sounds 4 quadrants. Soft and nontender to palpation. No guarding or rigidity. No palpable masses. No distention. No CVA tenderness. Musculoskeletal: Full range of motion to all  extremities. No gross deformities appreciated. Neurologic:  Normal speech and language. No gross focal neurologic deficits are appreciated.  Skin:  Skin is warm, dry and intact. No rash noted. Psychiatric: Mood and affect are normal. Speech and behavior are normal. Patient exhibits appropriate insight and judgement.    ED Results / Procedures / Treatments   Labs (all labs ordered are listed, but only abnormal results are displayed) Labs Reviewed  GROUP A STREP BY PCR - Abnormal; Notable for the following components:      Result Value   Group A Strep by PCR DETECTED (*)    All other components within normal limits  RESP PANEL BY RT-PCR (RSV, FLU A&B, COVID)  RVPGX2        PROCEDURES:  Critical Care performed: No  Procedures   MEDICATIONS ORDERED IN ED: Medications - No data to display   IMPRESSION / MDM / [place] / ED COURSE  I reviewed the triage vital signs and the nursing notes.                              Assessment and plan Pharyngitis 32 year old female presents to the emergency department with pharyngitis for the past 24 hours.  She  tested positive for group A strep.  Amoxicillin was sent to patient's pharmacy.  Return precautions were given to return with new or worsening symptoms.     FINAL CLINICAL IMPRESSION(S) / ED DIAGNOSES   Final diagnoses:  Strep throat     Rx / DC Orders   ED Discharge Orders          Ordered    amoxicillin (AMOXIL) 400 MG/5ML suspension  3 times daily        05/29/22 1840             Note:  This document was prepared using Dragon voice recognition software and may include unintentional dictation errors.   Karren Cobble 05/29/22 2207    Blake Divine, MD 05/29/22 [phone]

## 2022-05-29 NOTE — Discharge Instructions (Addendum)
Take Amoxicillin three times daily for the next ten days.

## 2022-05-29 NOTE — ED Notes (Signed)
Pt needs rx sent elsewhere for d/c. Provider notified.

## 2022-07-28 ENCOUNTER — Emergency Department
Admission: EM | Admit: 2022-07-28 | Discharge: 2022-07-28 | Disposition: A | Discharge status: Home / Self Care | Payer: Self-pay | Attending: Emergency Medicine | Admitting: Emergency Medicine

## 2022-07-28 ENCOUNTER — Other Ambulatory Visit: Payer: Self-pay

## 2022-07-28 ENCOUNTER — Emergency Department: Payer: Self-pay

## 2022-07-28 DIAGNOSIS — W182XXA Fall in (into) shower or empty bathtub, initial encounter: Secondary | ICD-10-CM | POA: Insufficient documentation

## 2022-07-28 DIAGNOSIS — I10 Essential (primary) hypertension: Secondary | ICD-10-CM | POA: Insufficient documentation

## 2022-07-28 DIAGNOSIS — E119 Type 2 diabetes mellitus without complications: Secondary | ICD-10-CM | POA: Insufficient documentation

## 2022-07-28 DIAGNOSIS — Y92002 Bathroom of unspecified non-institutional (private) residence single-family (private) house as the place of occurrence of the external cause: Secondary | ICD-10-CM | POA: Insufficient documentation

## 2022-07-28 DIAGNOSIS — M898X1 Other specified disorders of bone, shoulder: Secondary | ICD-10-CM

## 2022-07-28 DIAGNOSIS — S4992XA Unspecified injury of left shoulder and upper arm, initial encounter: Secondary | ICD-10-CM | POA: Insufficient documentation

## 2022-07-28 MED ORDER — METHOCARBAMOL 500 MG PO TABS: 500.0000 mg | ORAL_TABLET | Freq: Three times a day (TID) | ORAL | 0 refills | Status: DC | PRN

## 2022-07-28 MED ORDER — NAPROXEN 500 MG PO TABS: 500.0000 mg | ORAL_TABLET | Freq: Two times a day (BID) | ORAL | 0 refills | Status: DC

## 2022-07-28 NOTE — Discharge Instructions (Addendum)
Use ice off-and-on throughout the day and take the medication as prescribed.  If you are not feeling better in about a week, please see your primary care doctor or urgent care.  Return to the emergency department for symptoms that change or worsen.

## 2022-07-28 NOTE — ED Provider Notes (Signed)
[facility] Provider Note    Event Date/Time   First MD Initiated Contact with Patient 07/28/22 1957     (approximate)   History   Fall and Shoulder Injury   HPI  Natasha Davidson is a 32 y.o. female with history of hypertension, diabetes and as listed in EMR presents to the emergency department for treatment and evaluation of left side scapular pain after a mechanical, nonsyncopal fall this morning.  She was getting out of her shower when the floor was wet causing her to slip.  She landed directly on her left side.  Pain significantly increases with attempt to raise her left arm.  No head strike or loss of consciousness.Marland Kitchen      Physical Exam   Triage Vital Signs: ED Triage Vitals  Enc Vitals Group     BP 07/28/22 1918 (!) 140/92     Pulse Rate 07/28/22 1918 88     Resp 07/28/22 1918 18     Temp 07/28/22 1918 99.1 F (37.3 C)     Temp Source 07/28/22 1918 Oral     SpO2 07/28/22 1918 94 %     Weight 07/28/22 1919 268 lb 15.4 oz (122 kg)     Height 07/28/22 1919  (1.626 m)     Head Circumference --      Peak Flow --      Pain Score 07/28/22 1919 10     Pain Loc --      Pain Edu? --      Excl. in [place]? --     Most recent vital signs: Vitals:   07/28/22 1918  BP: (!) 140/92  Pulse: 88  Resp: 18  Temp: 99.1 F (37.3 C)  SpO2: 94%    General: Awake, no distress.  CV:  Good peripheral perfusion.  Resp:  Normal effort.  Abd:  No distention.  Other:  Diffuse tenderness over the left scapula.  No tenderness in the joint of the shoulder.  Pain increases in the scapula with abduction of the left arm.   ED Results / Procedures / Treatments   Labs (all labs ordered are listed, but only abnormal results are displayed) Labs Reviewed - No data to display   EKG     RADIOLOGY  Image and radiology report reviewed and interpreted by me. Radiology report consistent with the same.  Image of the left scapula shows no acute bony  abnormality.  PROCEDURES:  Critical Care performed: No  Procedures   MEDICATIONS ORDERED IN ED:  Medications - No data to display   IMPRESSION / MDM / ASSESSMENT AND PLAN / ED COURSE   I have reviewed the triage note.  Differential diagnosis includes, but is not limited to, contusion, scapular fracture, shoulder dislocation  Patient's presentation is most consistent with acute complicated illness / injury requiring diagnostic workup.  32 year old female presenting to the emergency department for treatment and evaluation of pain in the left side scapula after mechanical, nonsyncopal fall this morning.  She has a walk-in shower and when she was getting out of the shower the floor was wet she slipped and landed on her left side.  X-ray of the left scapula shows no acute bony abnormality.  Results discussed with the patient.  Plan will be to discharge her home with prescription for Robaxin and Naprosyn.  She is to schedule follow-up appointment with her primary care provider if she is not feeling better over the next few days.  If symptoms  change or worsen and she is unable to schedule an appointment she will go to urgent care or come back to the emergency department.      FINAL CLINICAL IMPRESSION(S) / ED DIAGNOSES   Final diagnoses:  Pain of left scapula     Rx / DC Orders   ED Discharge Orders          Ordered    methocarbamol (ROBAXIN) 500 MG tablet  Every 8 hours PRN        07/28/22 2145    naproxen (NAPROSYN) 500 MG tablet  2 times daily with meals        07/28/22 2145             Note:  This document was prepared using Dragon voice recognition software and may include unintentional dictation errors.   Chinita Pester, FNP 07/28/22 2147    Shaune Pollack, MD 07/29/22 [phone]

## 2022-07-28 NOTE — ED Triage Notes (Signed)
Pt sts she fell out of her shower on to the floor approx 7 hrs ago. C/o L shoulder pain. Pt was able to lift her arm a little bit.

## 2022-11-08 ENCOUNTER — Encounter: Payer: Self-pay | Admitting: Intensive Care

## 2022-11-08 ENCOUNTER — Emergency Department
Admission: EM | Admit: 2022-11-08 | Discharge: 2022-11-08 | Disposition: A | Discharge status: Home / Self Care | Payer: Self-pay | Attending: Emergency Medicine | Admitting: Emergency Medicine

## 2022-11-08 ENCOUNTER — Other Ambulatory Visit: Payer: Self-pay

## 2022-11-08 DIAGNOSIS — T63441A Toxic effect of venom of bees, accidental (unintentional), initial encounter: Secondary | ICD-10-CM | POA: Insufficient documentation

## 2022-11-08 DIAGNOSIS — N911 Secondary amenorrhea: Secondary | ICD-10-CM | POA: Insufficient documentation

## 2022-11-08 LAB — CBC WITH DIFFERENTIAL/PLATELET
Abs Immature Granulocytes: 0.01 10*3/uL (ref 0.00–0.07)
Basophils Absolute: 0 10*3/uL (ref 0.0–0.1)
Basophils Relative: 1 %
Eosinophils Absolute: 0.2 10*3/uL (ref 0.0–0.5)
Eosinophils Relative: 3 %
HCT: 41.6 % (ref 36.0–46.0)
Hemoglobin: 13.6 g/dL (ref 12.0–15.0)
Immature Granulocytes: 0 %
Lymphocytes Relative: 34 %
Lymphs Abs: 2.6 10*3/uL (ref 0.7–4.0)
MCH: 27.2 pg (ref 26.0–34.0)
MCHC: 32.7 g/dL (ref 30.0–36.0)
MCV: 83.2 fL (ref 80.0–100.0)
Monocytes Absolute: 0.5 10*3/uL (ref 0.1–1.0)
Monocytes Relative: 7 %
Neutro Abs: 4.2 10*3/uL (ref 1.7–7.7)
Neutrophils Relative %: 55 %
Platelets: 340 10*3/uL (ref 150–400)
RBC: 5 MIL/uL (ref 3.87–5.11)
RDW: 13.9 % (ref 11.5–15.5)
WBC: 7.7 10*3/uL (ref 4.0–10.5)
nRBC: 0 % (ref 0.0–0.2)

## 2022-11-08 LAB — URINALYSIS, ROUTINE W REFLEX MICROSCOPIC
Bilirubin Urine: NEGATIVE
Glucose, UA: NEGATIVE mg/dL
Hgb urine dipstick: NEGATIVE
Ketones, ur: NEGATIVE mg/dL
Nitrite: NEGATIVE
Protein, ur: NEGATIVE mg/dL
Specific Gravity, Urine: 1.028 (ref 1.005–1.030)
pH: 5 (ref 5.0–8.0)

## 2022-11-08 LAB — COMPREHENSIVE METABOLIC PANEL
ALT: 10 U/L (ref 0–44)
AST: 12 U/L — ABNORMAL LOW (ref 15–41)
Albumin: 3.7 g/dL (ref 3.5–5.0)
Alkaline Phosphatase: 96 U/L (ref 38–126)
Anion gap: 7 (ref 5–15)
BUN: 12 mg/dL (ref 6–20)
CO2: 25 mmol/L (ref 22–32)
Calcium: 8.9 mg/dL (ref 8.9–10.3)
Chloride: 105 mmol/L (ref 98–111)
Creatinine, Ser: 0.8 mg/dL (ref 0.44–1.00)
GFR, Estimated: >60 mL/min (ref >60)
Glucose, Bld: 94 mg/dL (ref 70–99)
Potassium: 3.8 mmol/L (ref 3.5–5.1)
Sodium: 137 mmol/L (ref 135–145)
Total Bilirubin: 0.2 mg/dL — ABNORMAL LOW (ref 0.3–1.2)
Total Protein: 7.8 g/dL (ref 6.5–8.1)

## 2022-11-08 LAB — HCG, QUANTITATIVE, PREGNANCY: hCG, Beta Chain, Quant, S: <1 m[IU]/mL (ref <5)

## 2022-11-08 MED ORDER — FAMOTIDINE 20 MG PO TABS: 20.0000 mg | ORAL_TABLET | Freq: Two times a day (BID) | ORAL | 0 refills | Status: AC

## 2022-11-08 NOTE — Discharge Instructions (Addendum)
Your exam and labs are normal and reassuring at this time.  No signs of a serious anaphylactic reaction to your recent bee sting.  You are having a local wasp reaction which is typical.  Take OTC allergy medicine including Claritin, Zyrtec, Allegra, or Benadryl.  Apply cool compresses to the thigh to help reduce swelling and pain.  Take OTC ibuprofen and Tylenol needed for pain.  Take prescription Pepcid twice daily as prescribed.  Follow-up with primary provider for ongoing evaluation of your missed menstrual period, with a negative blood pregnancy test.

## 2022-11-08 NOTE — ED Notes (Signed)
Pt verbalizes understanding of discharge instructions.

## 2022-11-08 NOTE — ED Triage Notes (Signed)
Patient reports being stung by hornet yesterday on left thigh. Area is warm and large discoloration circle present.   Patient reports feeling clammy and heat sensation in throat.

## 2022-11-08 NOTE — ED Provider Notes (Signed)
[facility] Emergency Department Provider Note     Event Date/Time   First MD Initiated Contact with Patient 11/08/22 1832     (approximate)   History   Allergic Reaction   HPI  Natasha Davidson is a 32 y.o. female with a non-contributory medical history, presents to the ED for evaluation of a hornet sting to the left thigh. She reports the incident happened yesterday. She denies anaphalyxis, angioedema, NV, or chest pain. She also requested a pregnancy test, noting amenorrhea since her LMP 09/18/22.   Physical Exam   Triage Vital Signs: ED Triage Vitals  Encounter Vitals Group     BP 11/08/22 1755 (!) 168/97     Systolic BP Percentile --      Diastolic BP Percentile --      Pulse Rate 11/08/22 1755 89     Resp 11/08/22 1755 18     Temp 11/08/22 1755 98.7 F (37.1 C)     Temp Source 11/08/22 1755 Oral     SpO2 11/08/22 1755 98 %     Weight 11/08/22 1756 272 lb (123.4 kg)     Height 11/08/22 1756 5\' 4"  (1.626 m)     Head Circumference --      Peak Flow --      Pain Score 11/08/22 1756 8     Pain Loc --      Pain Education --      Exclude from Growth Chart --     Most recent vital signs: Vitals:   11/08/22 1755  BP: (!) 168/97  Pulse: 89  Resp: 18  Temp: 98.7 F (37.1 C)  SpO2: 98%    General Awake, no distress. NAD HEENT NCAT. PERRL. EOMI. No rhinorrhea. Mucous membranes are moist.  No oral lesions noted.  No angioedema appreciated. CV:  Good peripheral perfusion. RRR RESP:  Normal effort.  ABD:  No distention.  SKIN:  Single bee sting lesion to the medial and inner left thigh.  Surrounding erythema and induration consistent with local reaction   ED Results / Procedures / Treatments   Labs (all labs ordered are listed, but only abnormal results are displayed) Labs Reviewed  COMPREHENSIVE METABOLIC PANEL - Abnormal; Notable for the following components:      Result Value   AST 12 (*)    Total Bilirubin 0.2 (*)    All other  components within normal limits  URINALYSIS, ROUTINE W REFLEX MICROSCOPIC - Abnormal; Notable for the following components:   Color, Urine YELLOW (*)    APPearance CLOUDY (*)    Leukocytes,Ua TRACE (*)    Bacteria, UA RARE (*)    All other components within normal limits  CBC WITH DIFFERENTIAL/PLATELET  HCG, QUANTITATIVE, PREGNANCY    EKG   RADIOLOGY  No results found.   PROCEDURES:  Critical Care performed: No  Procedures   MEDICATIONS ORDERED IN ED: Medications - No data to display   IMPRESSION / MDM / ASSESSMENT AND PLAN / ED COURSE  I reviewed the triage vital signs and the nursing notes.                              Differential diagnosis includes, but is not limited to, contact of otitis, local reaction, hives, angioedema, anaphylaxis  Patient's presentation is most consistent with acute complicated illness / injury requiring diagnostic workup.  Patient's diagnosis is consistent with local reaction to a bee sting  and secondary amenorrhea. Patient will be discharged home with prescriptions for famotidine and instructions to take OTC Benadryl as needed. Patient is to follow up with her primary doctor or GYN as discussed, as needed or otherwise directed. Patient is given ED precautions to return to the ED for any worsening or new symptoms.   FINAL CLINICAL IMPRESSION(S) / ED DIAGNOSES   Final diagnoses:  Bee sting reaction, accidental or unintentional, initial encounter  Secondary amenorrhea     Rx / DC Orders   ED Discharge Orders          Ordered    famotidine (PEPCID) 20 MG tablet  2 times daily        11/08/22 1852             Note:  This document was prepared using Dragon voice recognition software and may include unintentional dictation errors.    Lissa Hoard, PA-C 11/13/22 0019    Sharyn Creamer, MD 11/14/22 [phone]

## 2022-12-04 ENCOUNTER — Other Ambulatory Visit: Payer: Self-pay

## 2022-12-04 ENCOUNTER — Emergency Department
Admission: EM | Admit: 2022-12-04 | Discharge: 2022-12-04 | Disposition: A | Discharge status: Home / Self Care | Payer: Self-pay | Attending: Emergency Medicine | Admitting: Emergency Medicine

## 2022-12-04 DIAGNOSIS — I10 Essential (primary) hypertension: Secondary | ICD-10-CM | POA: Insufficient documentation

## 2022-12-04 DIAGNOSIS — E119 Type 2 diabetes mellitus without complications: Secondary | ICD-10-CM | POA: Insufficient documentation

## 2022-12-04 DIAGNOSIS — Z20822 Contact with and (suspected) exposure to covid-19: Secondary | ICD-10-CM | POA: Insufficient documentation

## 2022-12-04 DIAGNOSIS — R6889 Other general symptoms and signs: Secondary | ICD-10-CM

## 2022-12-04 DIAGNOSIS — M791 Myalgia, unspecified site: Secondary | ICD-10-CM | POA: Insufficient documentation

## 2022-12-04 LAB — RESP PANEL BY RT-PCR (RSV, FLU A&B, COVID)  RVPGX2
Influenza A by PCR: NEGATIVE
Influenza B by PCR: NEGATIVE
Resp Syncytial Virus by PCR: NEGATIVE
SARS Coronavirus 2 by RT PCR: NEGATIVE

## 2022-12-04 NOTE — Discharge Instructions (Signed)
Follow-up with your primary care provider if any continued problems or concerns.  Drink lots of fluids to stay hydrated.  Tylenol or ibuprofen if needed for body aches, fever, headache.  Treat the symptoms such as if you develop a cough or congestion take over-the-counter medication for this.

## 2022-12-04 NOTE — ED Triage Notes (Signed)
Pt to Ed via POV from home. Pt reports last night started having chills and woke up this morning with body aches. 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

## 2022-12-04 NOTE — ED Notes (Signed)
Pt states that they have been having really bad body aches that started last night, and believes that they ran a fever last night because they woke up sweating. Pt c/o of headaches and nausea, but denies vomiting. Pt denies taking any OTC medicines to help with symptoms. Pt is able to answer questions in full sentences and appears in NAD.

## 2022-12-04 NOTE — ED Provider Notes (Signed)
[facility] Provider Note    Event Date/Time   First MD Initiated Contact with Patient 12/04/22 0827     (approximate)   History   Generalized Body Aches   HPI  Natasha Davidson is a 32 y.o. female   presents to the ED with complaint of "not feeling well" since last night.  Onset was sudden.  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  Currently she denies any cough, congestion, nausea, vomiting, diarrhea.  Patient has a history of diabetes, hypertension, depression and anxiety.      Physical Exam   Triage Vital Signs: ED Triage Vitals  Encounter Vitals Group     BP 12/04/22 0825 (!) 132/97     Systolic BP Percentile --      Diastolic BP Percentile --      Pulse Rate 12/04/22 0825 (!) 110     Resp 12/04/22 0825 18     Temp 12/04/22 0825 99.6 F (37.6 C)     Temp Source 12/04/22 0825 Oral     SpO2 12/04/22 0825 98 %     Weight --      Height --      Head Circumference --      Peak Flow --      Pain Score 12/04/22 0829 7     Pain Loc --      Pain Education --      Exclude from Growth Chart --     Most recent vital signs: Vitals:   12/04/22 0825 12/04/22 0928  BP: (!) 132/97   Pulse: (!) 110   Resp: 18   Temp: 99.6 F (37.6 C) (!) 100.5 F (38.1 C)  SpO2: 98%      General: Awake, no distress.  CV:  Good peripheral perfusion.  Regular rate rhythm. Resp:  Normal effort.  Lungs clear bilaterally. Abd:  No distention.  Other:     ED Results / Procedures / Treatments   Labs (all labs ordered are listed, but only abnormal results are displayed) Labs Reviewed  RESP PANEL BY RT-PCR (RSV, FLU A&B, COVID)  [id number]     PROCEDURES:  Critical Care performed:   Procedures   MEDICATIONS ORDERED IN ED: Medications - No data to display   IMPRESSION / MDM / ASSESSMENT AND PLAN / ED COURSE  I reviewed the triage vital signs and the nursing notes.   Differential diagnosis includes, but is  not limited to, COVID, influenza, viral illness, URI.  32 year old female presents to the ED with complaint of not feeling well last evening with generalized bodyaches and fever.  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  Today respiratory panel is negative.  Patient was instructed to drink lots of fluids, Tylenol or ibuprofen as needed for body aches and fever.  A note was written for work today.  She is to follow-up with her PCP or urgent care if needed.      Patient's presentation is most consistent with acute complicated illness / injury requiring diagnostic workup.  FINAL CLINICAL IMPRESSION(S) / ED DIAGNOSES   Final diagnoses:  Flu-like symptoms     Rx / DC Orders   ED Discharge Orders     None        Note:  This document was prepared using Dragon voice recognition software and may include unintentional dictation errors.   Tommi Rumps, PA-C 12/04/22 [phone]  Jene Every, MD 12/04/22 1013

## 2022-12-27 ENCOUNTER — Emergency Department: Payer: Self-pay

## 2022-12-27 ENCOUNTER — Other Ambulatory Visit: Payer: Self-pay

## 2022-12-27 ENCOUNTER — Emergency Department
Admission: EM | Admit: 2022-12-27 | Discharge: 2022-12-27 | Disposition: A | Discharge status: Home / Self Care | Payer: Self-pay | Attending: Emergency Medicine | Admitting: Emergency Medicine

## 2022-12-27 DIAGNOSIS — J069 Acute upper respiratory infection, unspecified: Secondary | ICD-10-CM | POA: Insufficient documentation

## 2022-12-27 DIAGNOSIS — Z720 Tobacco use: Secondary | ICD-10-CM | POA: Insufficient documentation

## 2022-12-27 DIAGNOSIS — Z20822 Contact with and (suspected) exposure to covid-19: Secondary | ICD-10-CM | POA: Insufficient documentation

## 2022-12-27 DIAGNOSIS — R079 Chest pain, unspecified: Secondary | ICD-10-CM

## 2022-12-27 DIAGNOSIS — B9789 Other viral agents as the cause of diseases classified elsewhere: Secondary | ICD-10-CM | POA: Insufficient documentation

## 2022-12-27 LAB — CBC
HCT: 38.6 % (ref 36.0–46.0)
Hemoglobin: 12 g/dL (ref 12.0–15.0)
MCH: 26.4 pg (ref 26.0–34.0)
MCHC: 31.1 g/dL (ref 30.0–36.0)
MCV: 84.8 fL (ref 80.0–100.0)
Platelets: 330 10*3/uL (ref 150–400)
RBC: 4.55 MIL/uL (ref 3.87–5.11)
RDW: 14.3 % (ref 11.5–15.5)
WBC: 4.6 10*3/uL (ref 4.0–10.5)
nRBC: 0 % (ref 0.0–0.2)

## 2022-12-27 LAB — SARS CORONAVIRUS 2 BY RT PCR: SARS Coronavirus 2 by RT PCR: NEGATIVE

## 2022-12-27 LAB — BASIC METABOLIC PANEL
Anion gap: 8 (ref 5–15)
BUN: 8 mg/dL (ref 6–20)
CO2: 24 mmol/L (ref 22–32)
Calcium: 8.6 mg/dL — ABNORMAL LOW (ref 8.9–10.3)
Chloride: 106 mmol/L (ref 98–111)
Creatinine, Ser: 0.84 mg/dL (ref 0.44–1.00)
GFR, Estimated: >60 mL/min (ref >60)
Glucose, Bld: 96 mg/dL (ref 70–99)
Potassium: 3.5 mmol/L (ref 3.5–5.1)
Sodium: 138 mmol/L (ref 135–145)

## 2022-12-27 LAB — TROPONIN I (HIGH SENSITIVITY): Troponin I (High Sensitivity): <2 ng/L (ref <18)

## 2022-12-27 MED ORDER — ALBUTEROL SULFATE HFA 108 (90 BASE) MCG/ACT IN AERS: 2.0000 | INHALATION_SPRAY | Freq: Four times a day (QID) | RESPIRATORY_TRACT | 2 refills | Status: AC | PRN

## 2022-12-27 MED ORDER — BENZONATATE 100 MG PO CAPS: 100.0000 mg | ORAL_CAPSULE | Freq: Three times a day (TID) | ORAL | 0 refills | Status: AC | PRN

## 2022-12-27 NOTE — ED Triage Notes (Signed)
Pt comes with c/p and cough that started Wed. Pt states 8/10 pain. Pt denies any fevers or chills. Pt states right sided cp. Pt states some sob and more at night.

## 2022-12-27 NOTE — Discharge Instructions (Addendum)
You were seen in the emergency department for chest pain and cough.  Your chest x-ray did not have any signs of pneumonia.  No concern for heart attack today.  Your lab work was normal.  Your vital signs were normal.  You were given a prescription for an albuterol inhaler you can use 2 to 4 puffs every 6 hours as needed for wheezing.  You are given a prescription for [facility], you can take every 8 hours as needed for coughing.  Stay hydrated and drink plenty of fluids.  You can use over-the-counter cough medication and hot tea with honey for your cough.  Follow-up closely with your primary care physician and return to the emergency department if you have any worsening shortness of breath or symptoms. Try to work on stopping smoking :)   You can check your COVID result on your mychart.   Thank you for choosing [place] for your health care, it was my pleasure to care for you today!  Corena Herter, MD

## 2022-12-27 NOTE — ED Provider Notes (Addendum)
[facility] Provider Note    Event Date/Time   First MD Initiated Contact with Patient 12/27/22 0827     (approximate)   History   Chest Pain   HPI  Natasha Davidson is a 32 y.o. female presents to the emergency department with cough and chest pain.  Ongoing symptoms over the past 2 to 3 days.  Mild shortness of breath and feels like she is wheezing.  Does have daily tobacco use.  No history of DVT or PE.  No pleuritic pain.  No fever or chills.  Denies nausea, vomiting, diarrhea or leg swelling.  Denies concern for pregnancy.  No recent travel or surgery.     Physical Exam   Triage Vital Signs: ED Triage Vitals  Encounter Vitals Group     BP 12/27/22 0808 134/86     Systolic BP Percentile --      Diastolic BP Percentile --      Pulse Rate 12/27/22 0808 80     Resp 12/27/22 0808 18     Temp 12/27/22 0814 99.2 F (37.3 C)     Temp Source 12/27/22 0808 Oral     SpO2 12/27/22 0808 97 %     Weight 12/27/22 0808 272 lb (123.4 kg)     Height 12/27/22 0808 5\' 4"  (1.626 m)     Head Circumference --      Peak Flow --      Pain Score 12/27/22 0812 8     Pain Loc --      Pain Education --      Exclude from Growth Chart --     Most recent vital signs: Vitals:   12/27/22 0808 12/27/22 0814  BP: 134/86   Pulse: 80 66  Resp: 18 18  Temp:  99.2 F (37.3 C)  SpO2: 97%     Physical Exam Constitutional:      Appearance: She is well-developed.  HENT:     Head: Atraumatic.  Eyes:     Conjunctiva/sclera: Conjunctivae normal.  Cardiovascular:     Rate and Rhythm: Regular rhythm.     Heart sounds: Normal heart sounds.  Pulmonary:     Effort: No respiratory distress.  Abdominal:     General: There is no distension.     Palpations: Abdomen is soft.  Musculoskeletal:        General: Normal range of motion.     Cervical back: Normal range of motion.     Right lower leg: No edema.     Left lower leg: No edema.  Skin:    General: Skin is warm.      Capillary Refill: Capillary refill takes less than 2 seconds.  Neurological:     Mental Status: She is alert. Mental status is at baseline.     IMPRESSION / MDM / ASSESSMENT AND PLAN / ED COURSE  I reviewed the triage vital signs and the nursing notes.  Differential diagnosis including COVID, bacterial pneumonia, anemia, ACS.    EKG  I, Corena Herter, the attending physician, personally viewed and interpreted this ECG.   Rate: Normal  Rhythm: Normal sinus  Axis: Normal  Intervals: Normal  ST&T Change: None   RADIOLOGY I independently reviewed imaging, my interpretation of imaging: Chest x-ray with no signs of pneumonia  LABS (all labs ordered are listed, but only abnormal results are displayed) Labs interpreted as -    Labs Reviewed  BASIC METABOLIC PANEL - Abnormal; Notable for the following components:  Result Value   Calcium 8.6 (*)    All other components within normal limits  SARS CORONAVIRUS 2 BY RT PCR  CBC  POC URINE PREG, ED  TROPONIN I (HIGH SENSITIVITY)     MDM    COVID and influenza testing are in process.  No significant anemia or leukocytosis.  Creatinine at baseline with no significant electrolyte abnormalities.  Troponin negative.  Low risk heart score have a low suspicion for ACS and do not feel that serial troponins are necessary.  Low risk heart score and PERC negative, low suspicion for pulmonary embolism.  Discussed symptomatic treatment.  Most likely with upper respiratory virus.  Discussed at length smoking cessation.  Discussed follow-up and return precautions.  Given a prescription for albuterol and Tessalon Perles.   PROCEDURES:  Critical Care performed: No  Procedures  Patient's presentation is most consistent with acute presentation with potential threat to life or bodily function.   MEDICATIONS ORDERED IN ED: Medications - No data to display  FINAL CLINICAL IMPRESSION(S) / ED DIAGNOSES   Final diagnoses:  Chest pain,  unspecified type  Upper respiratory virus     Rx / DC Orders   ED Discharge Orders          Ordered    albuterol (VENTOLIN HFA) 108 (90 Base) MCG/ACT inhaler  Every 6 hours PRN        12/27/22 0839    benzonatate (TESSALON PERLES) 100 MG capsule  3 times daily PRN        12/27/22 0839             Note:  This document was prepared using Dragon voice recognition software and may include unintentional dictation errors.   Corena Herter, MD 12/27/22 2952    Corena Herter, MD 12/27/22 [phone]

## 2023-04-22 IMAGING — CT CT ABD-PELV W/ CM
2 of 4 series · 17 of 46 positions shown, 19 images · IV contrast (agent unspecified)
Comparison: None Available.

CLINICAL DATA: Right lower quadrant abdominal pain

EXAM:
CT ABDOMEN AND PELVIS WITH CONTRAST
TECHNIQUE: Multidetector CT imaging of the abdomen and pelvis was performed
using the standard protocol following bolus administration of
intravenous contrast.

[Series 2: axial st · axial · 0.89mm/px · z∈[+879,+1269]mm · 14 of 90 slices shown, 16 images]
[im 6/90  soft-tissue]
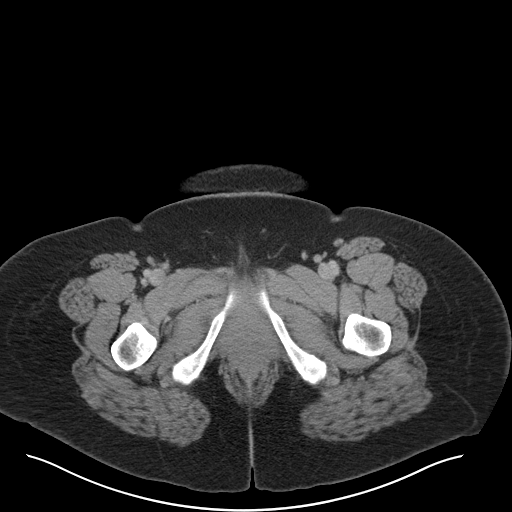
[im 6/90  bone]
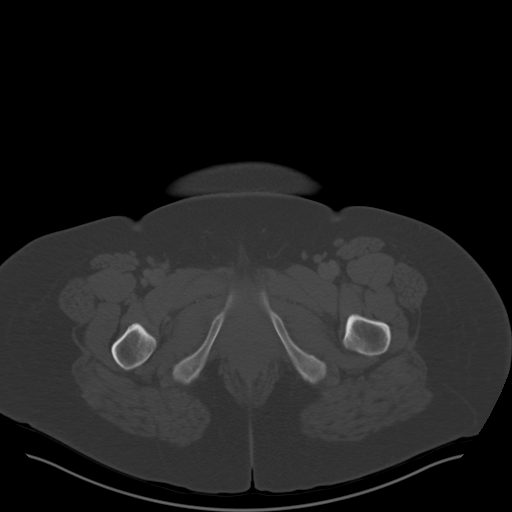
[im 12/90  soft-tissue]
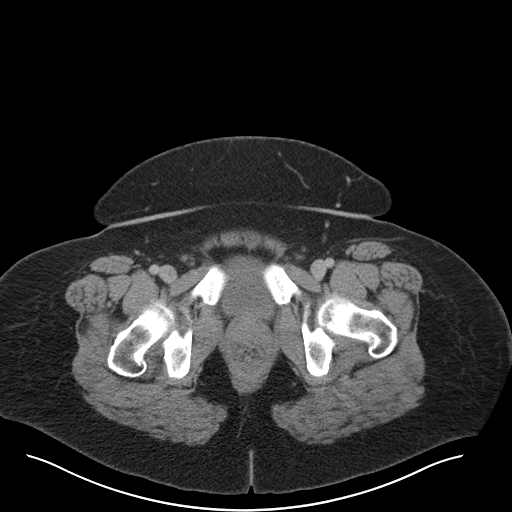
[im 18/90  soft-tissue]
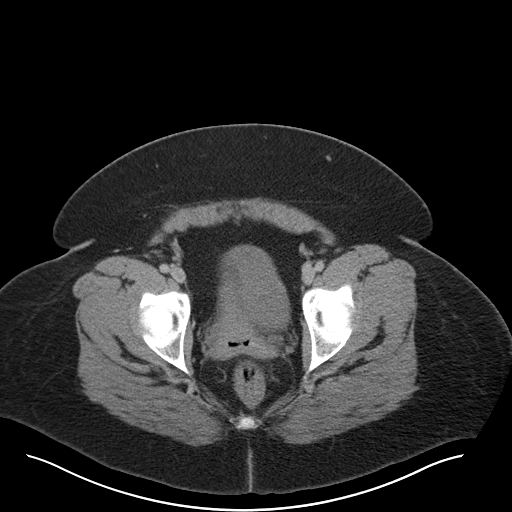
[im 24/90  soft-tissue]
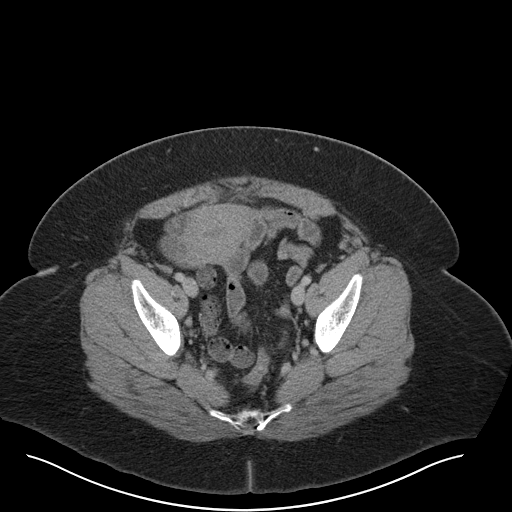
[im 30/90  soft-tissue]
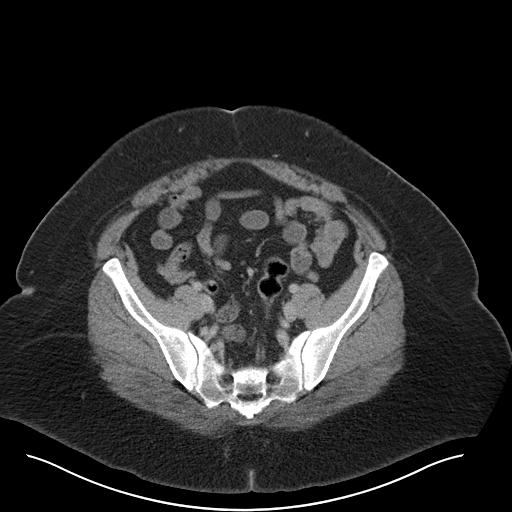
[im 36/90  soft-tissue]
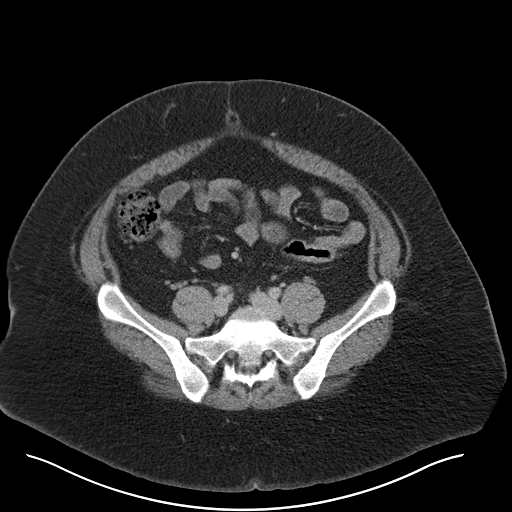
[im 42/90  soft-tissue]
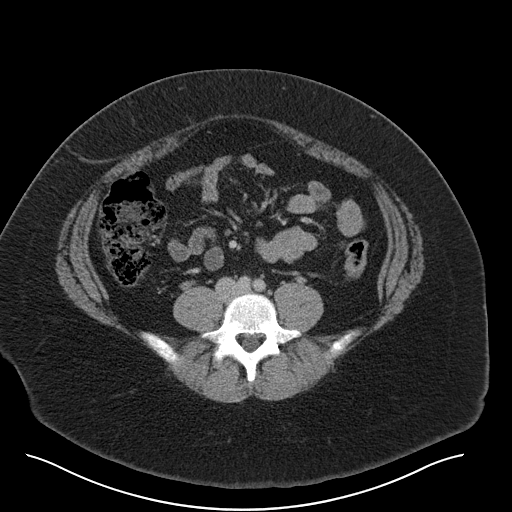
[im 48/90  soft-tissue]
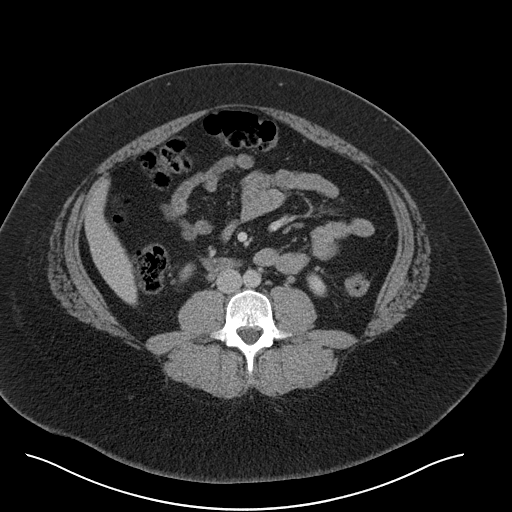
[im 54/90  soft-tissue]
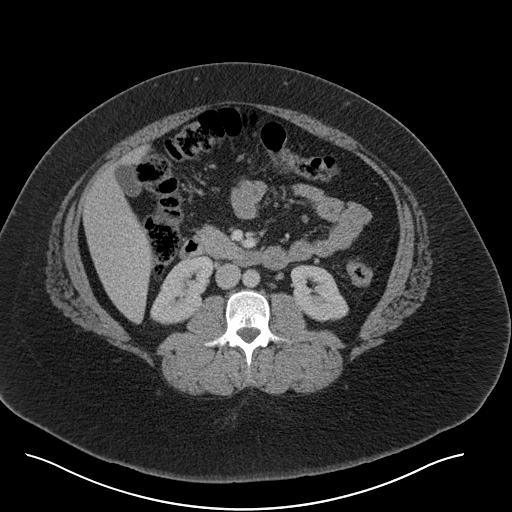
[im 54/90  bone]
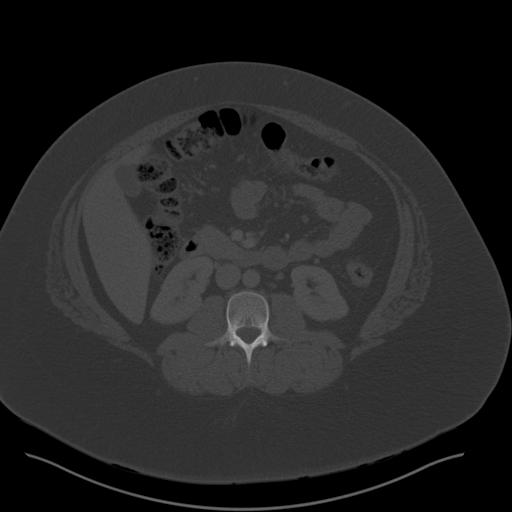
[im 60/90  soft-tissue]
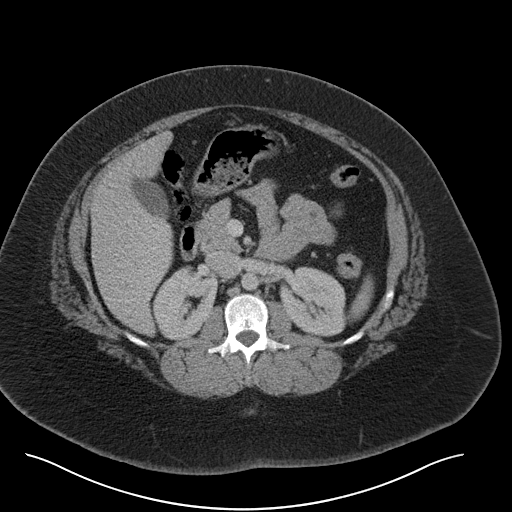
[im 66/90  soft-tissue]
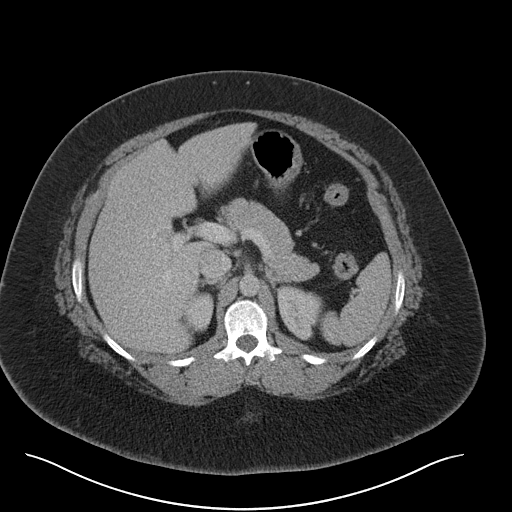
[im 72/90  soft-tissue]
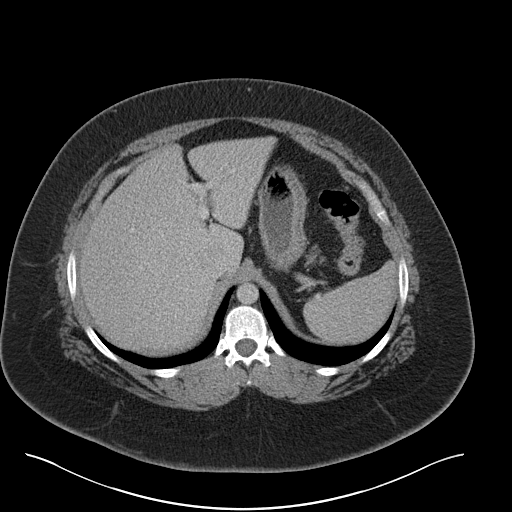
[im 78/90  soft-tissue]
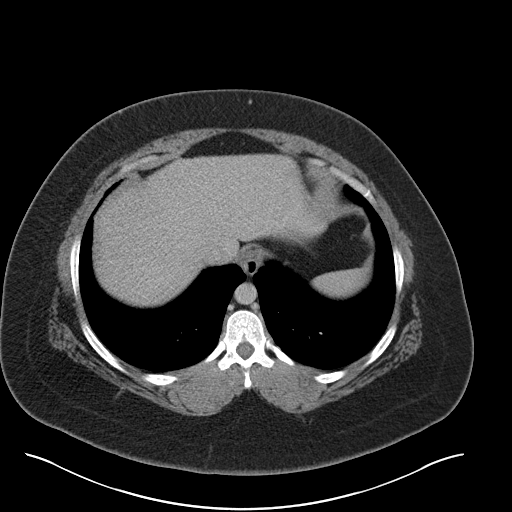
[im 84/90  soft-tissue]
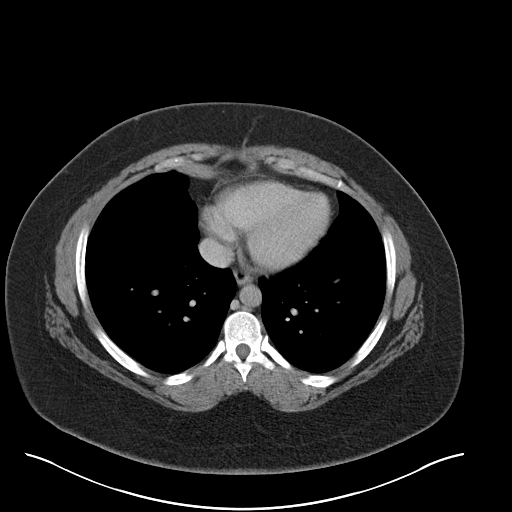

[Series 5: coronal st · coronal · 0.88mm/px · 3 of 195 slices shown]
[im 65/195  soft-tissue]
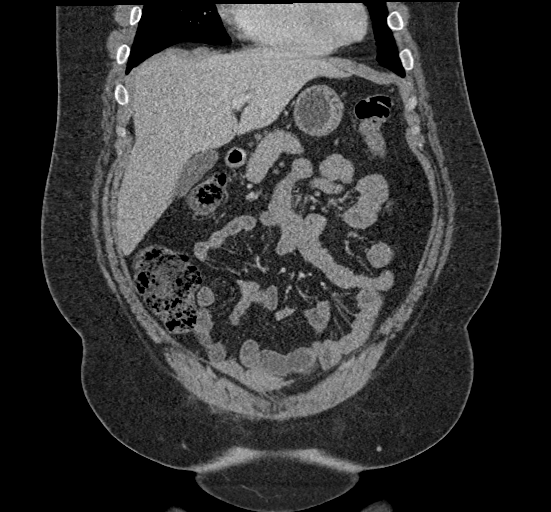
[im 87/195  soft-tissue]
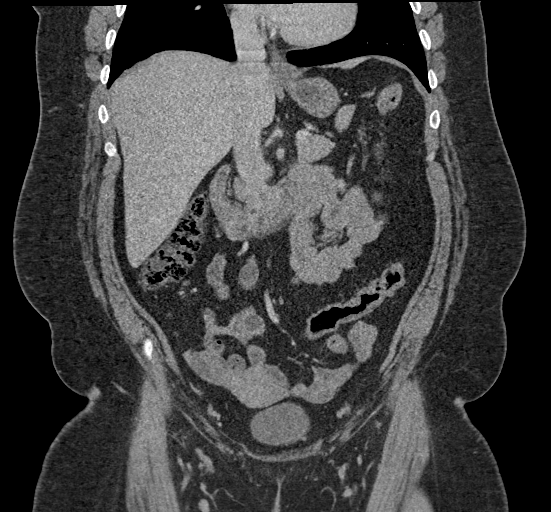
[im 108/195  soft-tissue]
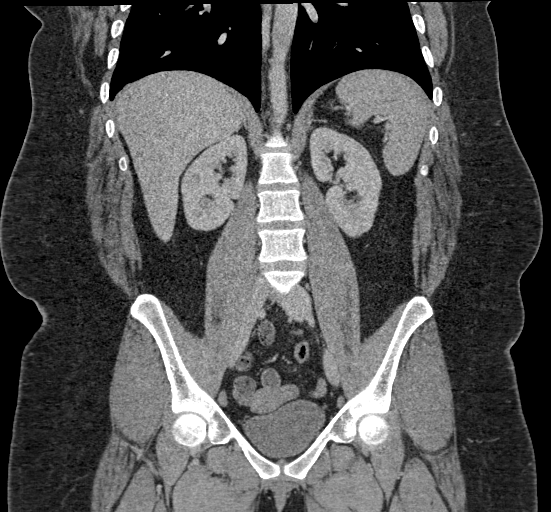

[17 of 46 positions shown; findings below may reference images not displayed]

RADIATION DOSE REDUCTION: This exam was performed according to the
departmental dose-optimization program which includes automated
exposure control, adjustment of the mA and/or kV according to
patient size and/or use of iterative reconstruction technique.

CONTRAST:  100mL OMNIPAQUE IOHEXOL 300 MG/ML  SOLN
FINDINGS: Lower chest: No acute abnormality.

Hepatobiliary: No focal liver abnormality is seen. No gallstones,
gallbladder wall thickening, or biliary dilatation.

Pancreas: Unremarkable

Spleen: Unremarkable

Adrenals/Urinary Tract: Adrenal glands are unremarkable. Kidneys are
normal, without renal calculi, focal lesion, or hydronephrosis.
Bladder is unremarkable.

Stomach/Bowel: Stomach is within normal limits. Appendix appears
normal. No evidence of bowel wall thickening, distention, or
inflammatory changes.

Vascular/Lymphatic: No significant vascular findings are present. No
enlarged abdominal or pelvic lymph nodes.

Reproductive: Uterus and bilateral adnexa are unremarkable.

Other: Tiny fat containing umbilical hernia

Musculoskeletal: No acute or significant osseous findings.
IMPRESSION: No acute intra-abdominal pathology identified. Normal appendix.

## 2023-06-21 ENCOUNTER — Emergency Department: Payer: Self-pay

## 2023-06-21 ENCOUNTER — Other Ambulatory Visit: Payer: Self-pay

## 2023-06-21 ENCOUNTER — Emergency Department
Admission: EM | Admit: 2023-06-21 | Discharge: 2023-06-21 | Disposition: A | Discharge status: Home / Self Care | Payer: Self-pay | Attending: Emergency Medicine | Admitting: Emergency Medicine

## 2023-06-21 DIAGNOSIS — W108XXA Fall (on) (from) other stairs and steps, initial encounter: Secondary | ICD-10-CM | POA: Insufficient documentation

## 2023-06-21 DIAGNOSIS — M25561 Pain in right knee: Secondary | ICD-10-CM

## 2023-06-21 DIAGNOSIS — S86911A Strain of unspecified muscle(s) and tendon(s) at lower leg level, right leg, initial encounter: Secondary | ICD-10-CM | POA: Insufficient documentation

## 2023-06-21 MED ORDER — KETOROLAC TROMETHAMINE 30 MG/ML IJ SOLN
15.0000 mg | Freq: Once | INTRAMUSCULAR | Status: AC
  Administered 2023-06-21: 15 mg via INTRAVENOUS
  Filled 2023-06-21: qty 1

## 2023-06-21 NOTE — ED Notes (Signed)
 Pt refused pregnancy test needed for xray; state she has had her tubes tied and doesn't need the test. Cailin Schirmer, RT notified.

## 2023-06-21 NOTE — Discharge Instructions (Signed)
 Please take Tylenol and ibuprofen/Advil for your pain.  It is safe to take them together, or to alternate them every few hours.  Take up to 1000mg  of Tylenol at a time, up to 4 times per day.  Do not take more than 4000 mg of Tylenol in 24 hours.  For ibuprofen, take 400-600 mg, 3 - 4 times per day.  Keep the knee in a brace, using crutches to get around, until you can see the orthopedic doctor in the clinic for a recheck.

## 2023-06-21 NOTE — ED Notes (Signed)
 Knee immobilizer applied to RLE. Pt given crutches and instructed on use.

## 2023-06-21 NOTE — ED Triage Notes (Signed)
 Pt in from home by EMS for a fall. Pt states she slipped on her steps due to them being wet and her right knee snapped backwards. Pt was initially complaining of 9/10 right knee pain; fentanyl administered by EMS PTA. Upon arrival to ED, pain had decreased to 1/10. Swelling noted to RLE; no obvious deformity observed.

## 2023-06-21 NOTE — ED Provider Notes (Signed)
 [facility] Provider Note    Event Date/Time   First MD Initiated Contact with Patient 06/21/23 1331     (approximate)   History   Knee Pain   HPI  Natasha Davidson is a 33 y.o. female who presents to the ED for evaluation of Knee Pain   Morbidly obese patient presents from home via EMS after stumbling down the steps this morning causing a pop sensation to her right knee and pain in her right leg.  This occurred just prior to arrival.  Stumbled when going down the steps reports a popping sensation to the middle part of the anterior aspect of her right knee.  Had severe pain and difficulty ambulating after this.  She reports pain throughout her calf, knee and thigh on the right side.  No other injury, no head trauma.  No syncope.  100 mcg of fentanyl IV with EMS.  Zofran 4 mg    Physical Exam   Triage Vital Signs: ED Triage Vitals  Encounter Vitals Group     BP      Systolic BP Percentile      Diastolic BP Percentile      Pulse      Resp      Temp      Temp src      SpO2      Weight      Height      Head Circumference      Peak Flow      Pain Score      Pain Loc      Pain Education      Exclude from Growth Chart     Most recent vital signs: Vitals:   06/21/23 1333  BP: (!) 147/86  Pulse: 94  Resp: 17  Temp: 98.2 F (36.8 C)  SpO2: 95%    General: Awake, no distress.  Obese, sitting up in bed.  Does not cry out when transferring to our stretcher from EMS. CV:  Good peripheral perfusion.  Resp:  Normal effort.  Abd:  No distention.  MSK:  No deformity noted.  Tenderness to palpation throughout the distal aspect of the right femur, right knee and proximal right calf/shin.  No clear overlying signs of trauma such as swelling, skin changes or laceration.  Right foot with a strong DP pulse symmetric to the left, no pain to the foot, ankle on the right.  No pain to the hip or pelvis Neuro:  No focal deficits appreciated. Other:     ED  Results / Procedures / Treatments   Labs (all labs ordered are listed, but only abnormal results are displayed) Labs Reviewed  POC URINE PREG, ED    EKG   RADIOLOGY Plain film of the right tib-fib interpreted by me without signs of fracture or dislocation Plain film of the right knee interpreted by me without signs of fracture dislocation Plain film of the right femur interpreted by me without signs of fracture or dislocation  Official radiology report(s): DG Tibia/Fibula Right Result Date: 06/21/2023 CLINICAL DATA:  Fall. Popping sensation to right knee. Pain throughout thigh, knee, calf, and shin. EXAM: RIGHT TIBIA AND FIBULA - 2 VIEW COMPARISON:  Right knee radiographs 12/06/2019 FINDINGS: No knee joint effusion. Mild inferior patellar degenerative spurring. Small plantar calcaneal heel spur. No acute fracture or dislocation. IMPRESSION: 1. No acute fracture. 2. Mild inferior patellar degenerative spurring. Electronically Signed   By: Neita Garnet M.D.   On: 06/21/2023  14:41   DG Knee Complete 4 Views Right Result Date: 06/21/2023 CLINICAL DATA:  Fall. Popping sensation to right knee. Pain throughout thigh, knee, calf, and shin. EXAM: RIGHT KNEE - COMPLETE 4+ VIEW COMPARISON:  Right knee radiographs 12/06/2019 FINDINGS: Normal bone mineralization. Joint spaces are preserved. Mild inferior patellar degenerative spurring, unchanged. No joint effusion. No acute fracture or dislocation. IMPRESSION: 1. No acute fracture. 2. Mild inferior patellar degenerative spurring, unchanged. Electronically Signed   By: Neita Garnet M.D.   On: 06/21/2023 14:40   DG Femur Min 2 Views Right Result Date: 06/21/2023 CLINICAL DATA:  Fall. Popping sensation to right knee. Pain throughout thigh, knee, calf, and shin. EXAM: RIGHT FEMUR 2 VIEWS COMPARISON:  Right knee radiographs 12/06/2019 FINDINGS: Normal bone mineralization. Mild inferior patellar degenerative spurring. No knee joint effusion. No acute fracture  or dislocation. The right femoroacetabular joint space is maintained. IMPRESSION: 1. No acute fracture. 2. Mild inferior patellar degenerative spurring. Electronically Signed   By: Neita Garnet M.D.   On: 06/21/2023 14:39    PROCEDURES and INTERVENTIONS:  Procedures  Medications  ketorolac (TORADOL) 30 MG/ML injection 15 mg (15 mg Intravenous Given 06/21/23 1435)     IMPRESSION / MDM / ASSESSMENT AND PLAN / ED COURSE  I reviewed the triage vital signs and the nursing notes.  Differential diagnosis includes, but is not limited to, soft tissue strain, ligamentous injury of the knee, fracture, dislocation, cardiac dysrhythmia, seizure  {Patient presents with symptoms of an acute illness or injury that is potentially life-threatening.  Patient presents after mechanical injury going up some steps.  Pain throughout the right leg centered around the right knee.  No other signs of trauma.  No signs of neurologic or vascular deficits.  Pain-free after EMS fentanyl.  We will obtain x-rays and reassess.  X-rays are reassuring, suspect soft tissue or ligamentous injury of the knee.  Provide knee immobilizer, crutches and orthopedic follow-up information.      FINAL CLINICAL IMPRESSION(S) / ED DIAGNOSES   Final diagnoses:  Acute pain of right knee  Strain of right knee, initial encounter     Rx / DC Orders   ED Discharge Orders     None        Note:  This document was prepared using Dragon voice recognition software and may include unintentional dictation errors.   Delton [place], MD 06/21/23 [phone]

## 2024-01-13 ENCOUNTER — Other Ambulatory Visit: Payer: Self-pay

## 2024-01-13 ENCOUNTER — Emergency Department
Admission: EM | Admit: 2024-01-13 | Discharge: 2024-01-13 | Disposition: A | Discharge status: Home / Self Care | Payer: Self-pay | Attending: Emergency Medicine | Admitting: Emergency Medicine

## 2024-01-13 DIAGNOSIS — J069 Acute upper respiratory infection, unspecified: Secondary | ICD-10-CM | POA: Insufficient documentation

## 2024-01-13 LAB — RESP PANEL BY RT-PCR (RSV, FLU A&B, COVID)  RVPGX2
Influenza A by PCR: NEGATIVE
Influenza B by PCR: NEGATIVE
Resp Syncytial Virus by PCR: NEGATIVE
SARS Coronavirus 2 by RT PCR: NEGATIVE

## 2024-01-13 NOTE — ED Triage Notes (Signed)
 Patient states she had a flu vaccine on Thursday and developed chills, body aches and nausea; her employer is requesting a covid and flu test before returning. Patient denies all symptoms.

## 2024-01-13 NOTE — Discharge Instructions (Addendum)
 Please follow-up with your outpatient provider.  You may find the results of your testing on MyChart.  You did not wish to wait for the results.  Please return for any new, worsening, or change in symptoms or other concerns.

## 2024-01-13 NOTE — ED Provider Notes (Signed)
 [facility] Provider Note    Event Date/Time   First MD Initiated Contact with Patient 01/13/24 1209     (approximate)   History   Medical complaint   HPI  Natasha Davidson is a 33 y.o. female who presents today for quest for COVID/flu test.  Patient reports that she got the flu shot at work last week and then developed body aches and chills.  She reports that this has since resolved.  However she was instructed by her employer to come get a swab.  Patient does not wish to wait for the results as she reports that she has to be at court.  She reports that she feels her normal self.  Patient Active Problem List   Diagnosis Date Noted   Non-reactive NST (non-stress test) 12/02/2016   History of chronic hypertension 11/20/2016   Oral hypoglycemic controlled White classification A2 gestational diabetes mellitus (GDM) 11/04/2016   BMI 40.0-44.9, adult (HCC) 05/16/2015          Physical Exam   Triage Vital Signs: ED Triage Vitals [01/13/24 1204]  Encounter Vitals Group     BP (!) 156/99     Girls Systolic BP Percentile      Girls Diastolic BP Percentile      Boys Systolic BP Percentile      Boys Diastolic BP Percentile      Pulse Rate 100     Resp 18     Temp 97.8 F (36.6 C)     Temp Source Oral     SpO2 100 %     Weight      Height      Head Circumference      Peak Flow      Pain Score      Pain Loc      Pain Education      Exclude from Growth Chart     Most recent vital signs: Vitals:   01/13/24 1204  BP: (!) 156/99  Pulse: 100  Resp: 18  Temp: 97.8 F (36.6 C)  SpO2: 100%    Physical Exam Vitals and nursing note reviewed.  Constitutional:      General: Awake and alert. No acute distress.    Appearance: Normal appearance. The patient is normal weight.  HENT:     Head: Normocephalic and atraumatic.     Mouth: Mucous membranes are moist.  Eyes:     General: PERRL. Normal EOMs        Right eye: No discharge.        Left  eye: No discharge.     Conjunctiva/sclera: Conjunctivae normal.  Cardiovascular:     Rate and Rhythm: Normal rate and regular rhythm.     Pulses: Normal pulses.  Pulmonary:     Effort: Pulmonary effort is normal. No respiratory distress.     Breath sounds: Normal breath sounds.  Abdominal:     Abdomen is soft. There is no abdominal tenderness. No rebound or guarding. No distention. Musculoskeletal:        General: No swelling. Normal range of motion.     Cervical back: Normal range of motion and neck supple.  Skin:    General: Skin is warm and dry.     Capillary Refill: Capillary refill takes less than 2 seconds.     Findings: No rash.  Neurological:     Mental Status: The patient is awake and alert.      ED Results / Procedures /  Treatments   Labs (all labs ordered are listed, but only abnormal results are displayed) Labs Reviewed  RESP PANEL BY RT-PCR (RSV, FLU A&B, COVID)  [id number]     EKG     RADIOLOGY     PROCEDURES:  Critical Care performed:   Procedures   MEDICATIONS ORDERED IN ED: Medications - No data to display   IMPRESSION / MDM / ASSESSMENT AND PLAN / ED COURSE  I reviewed the triage vital signs and the nursing notes.   Differential diagnosis includes, but is not limited to, COVID, influenza, other URI.  Patient is awake and alert, hemodynamically stable and afebrile.  She is nontoxic in appearance.  She reports that her symptoms have resolved.  COVID/flu/RSV swab obtained, the patient did not wish to wait for the results.  She was advised that she can buy them on MyChart.  We discussed return precautions and outpatient follow-up.  Patient understands and agrees with plan.  Discharged in stable condition   Patient's presentation is most consistent with acute complicated illness / injury requiring diagnostic workup.     FINAL CLINICAL IMPRESSION(S) / ED DIAGNOSES   Final diagnoses:  Upper respiratory tract infection, unspecified type      Rx / DC Orders   ED Discharge Orders     None        Note:  This document was prepared using Dragon voice recognition software and may include unintentional dictation errors.   Balthazar Dooly E, PA-C 01/13/24 1432    Arlander Charleston, MD 01/13/24 1438

## 2024-04-15 ENCOUNTER — Emergency Department: Payer: Self-pay

## 2024-04-15 ENCOUNTER — Emergency Department
Admission: EM | Admit: 2024-04-15 | Discharge: 2024-04-15 | Disposition: A | Discharge status: Home / Self Care | Payer: Self-pay | Attending: Emergency Medicine | Admitting: Emergency Medicine

## 2024-04-15 ENCOUNTER — Other Ambulatory Visit: Payer: Self-pay

## 2024-04-15 DIAGNOSIS — R079 Chest pain, unspecified: Secondary | ICD-10-CM | POA: Insufficient documentation

## 2024-04-15 DIAGNOSIS — M79602 Pain in left arm: Secondary | ICD-10-CM | POA: Insufficient documentation

## 2024-04-15 DIAGNOSIS — I10 Essential (primary) hypertension: Secondary | ICD-10-CM | POA: Insufficient documentation

## 2024-04-15 DIAGNOSIS — E119 Type 2 diabetes mellitus without complications: Secondary | ICD-10-CM | POA: Diagnosis not present

## 2024-04-15 DIAGNOSIS — M546 Pain in thoracic spine: Secondary | ICD-10-CM | POA: Insufficient documentation

## 2024-04-15 DIAGNOSIS — Y92410 Unspecified street and highway as the place of occurrence of the external cause: Secondary | ICD-10-CM | POA: Insufficient documentation

## 2024-04-15 MED ORDER — CYCLOBENZAPRINE HCL 10 MG PO TABS
5.0000 mg | ORAL_TABLET | Freq: Once | ORAL | Status: AC
  Administered 2024-04-15: 5 mg via ORAL
  Filled 2024-04-15: qty 1

## 2024-04-15 MED ORDER — HYDROCODONE-ACETAMINOPHEN 5-325 MG PO TABS
1.0000 | ORAL_TABLET | Freq: Once | ORAL | Status: AC
  Administered 2024-04-15: 1 via ORAL
  Filled 2024-04-15: qty 1

## 2024-04-15 MED ORDER — CYCLOBENZAPRINE HCL 5 MG PO TABS: 5.0000 mg | ORAL_TABLET | Freq: Three times a day (TID) | ORAL | 0 refills | Status: AC | PRN

## 2024-04-15 MED ORDER — LIDOCAINE 5 % EX PTCH
1.0000 | MEDICATED_PATCH | CUTANEOUS | Status: DC
  Administered 2024-04-15: 1 via TRANSDERMAL
  Filled 2024-04-15: qty 1
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
